# Patient Record
Sex: Female | Born: 1995
Health system: Southern US, Community
[De-identification: ages and names within clinical notes are randomized; demographics above are authoritative.]

## PROBLEM LIST (undated history)

## (undated) ENCOUNTER — Inpatient Hospital Stay (HOSPITAL_COMMUNITY): Payer: Self-pay

## (undated) ENCOUNTER — Ambulatory Visit: Admission: EM | Discharge status: Absent without leave | Payer: Medicaid Other

## (undated) DIAGNOSIS — IMO0002 Reserved for concepts with insufficient information to code with codable children: Secondary | ICD-10-CM

## (undated) DIAGNOSIS — B9689 Other specified bacterial agents as the cause of diseases classified elsewhere: Secondary | ICD-10-CM

## (undated) DIAGNOSIS — J4520 Mild intermittent asthma, uncomplicated: Secondary | ICD-10-CM

## (undated) DIAGNOSIS — L309 Dermatitis, unspecified: Secondary | ICD-10-CM

## (undated) DIAGNOSIS — J40 Bronchitis, not specified as acute or chronic: Secondary | ICD-10-CM

## (undated) DIAGNOSIS — J45909 Unspecified asthma, uncomplicated: Secondary | ICD-10-CM

## (undated) DIAGNOSIS — J309 Allergic rhinitis, unspecified: Secondary | ICD-10-CM

## (undated) DIAGNOSIS — N390 Urinary tract infection, site not specified: Secondary | ICD-10-CM

## (undated) DIAGNOSIS — O26899 Other specified pregnancy related conditions, unspecified trimester: Secondary | ICD-10-CM

## (undated) DIAGNOSIS — N760 Acute vaginitis: Secondary | ICD-10-CM

## (undated) DIAGNOSIS — R109 Unspecified abdominal pain: Secondary | ICD-10-CM

## (undated) HISTORY — DX: Mild intermittent asthma, uncomplicated: J45.20

## (undated) HISTORY — DX: Reserved for concepts with insufficient information to code with codable children: IMO0002

## (undated) HISTORY — DX: Other specified pregnancy related conditions, unspecified trimester: O26.899

## (undated) HISTORY — PX: NO PAST SURGERIES: SHX2092

## (undated) HISTORY — DX: Unspecified abdominal pain: R10.9

## (undated) HISTORY — DX: Dermatitis, unspecified: L30.9

## (undated) HISTORY — DX: Allergic rhinitis, unspecified: J30.9

---
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
## 2013-01-26 ENCOUNTER — Encounter (HOSPITAL_COMMUNITY): Payer: Self-pay | Admitting: Emergency Medicine

## 2013-01-26 ENCOUNTER — Emergency Department (HOSPITAL_COMMUNITY)
Admission: EM | Admit: 2013-01-26 | Discharge: 2013-01-26 | Disposition: A | Discharge status: Home / Self Care | Payer: Medicaid Other | Attending: Emergency Medicine | Admitting: Emergency Medicine

## 2013-01-26 DIAGNOSIS — J069 Acute upper respiratory infection, unspecified: Secondary | ICD-10-CM

## 2013-01-26 DIAGNOSIS — IMO0001 Reserved for inherently not codable concepts without codable children: Secondary | ICD-10-CM | POA: Insufficient documentation

## 2013-01-26 DIAGNOSIS — J45909 Unspecified asthma, uncomplicated: Secondary | ICD-10-CM | POA: Insufficient documentation

## 2013-01-26 MED ORDER — HYDROCOD POLST-CHLORPHEN POLST 10-8 MG/5ML PO LQCR: 5.0000 mL | Freq: Two times a day (BID) | ORAL | Status: DC | PRN

## 2013-01-26 NOTE — ED Notes (Signed)
Pt reports congestion, chills, body aches x 1 week

## 2013-01-26 NOTE — ED Provider Notes (Signed)
CSN: [id number]     Arrival date & time 01/26/13  1419 History  This chart was scribed for non-physician practitioner working with Candyce Churn, MD by Leone Payor, ED Scribe. This patient was seen in room TR08C/TR08C and the patient's care was started at 1419.     Chief Complaint  Patient presents with  . URI    The history is provided by the patient. No language interpreter was used.    HPI Comments: Sharon Waller is a 17 y.o. female with past medical history of asthma who presents to the Emergency Department complaining of 7 days of gradual onset, gradually worsening, constant sore throat with associated subjective fevers, cough, and myalgias. Pt states she has taken generic Nyquil without relief. LNMP was last month.    Past Medical History  Diagnosis Date  . Asthma    History reviewed. No pertinent past surgical history. History reviewed. No pertinent family history. History  Substance Use Topics  . Smoking status: Never Smoker   . Smokeless tobacco: Not on file  . Alcohol Use: No   OB History   Grav Para Term Preterm Abortions TAB SAB Ect Mult Living                 Review of Systems  Constitutional: Positive for fever.  HENT: Positive for sore throat.   Respiratory: Positive for cough.   Musculoskeletal: Positive for myalgias.  All other systems reviewed and are negative.    Allergies  Augmentin  Home Medications  No current outpatient prescriptions on file. BP 114/62  Pulse 74  Resp 18  SpO2 100% Physical Exam  Nursing note and vitals reviewed. Constitutional: She is oriented to person, place, and time. She appears well-developed and well-nourished.  HENT:  Head: Normocephalic and atraumatic.  Right Ear: Tympanic membrane, external ear and ear canal normal.  Left Ear: Tympanic membrane and ear canal normal.  Mouth/Throat: Uvula is midline, oropharynx is clear and moist and mucous membranes are normal. No oropharyngeal exudate, posterior  oropharyngeal edema or posterior oropharyngeal erythema.  Cardiovascular: Normal rate, regular rhythm and normal heart sounds.   Pulmonary/Chest: Effort normal and breath sounds normal. She has no wheezes. She has no rales.  Abdominal: She exhibits no distension.  Neurological: She is alert and oriented to person, place, and time.  Skin: Skin is warm and dry.  Psychiatric: She has a normal mood and affect.    ED Course  Procedures   DIAGNOSTIC STUDIES: Oxygen Saturation is 100% on RA, normal by my interpretation.    COORDINATION OF CARE: 3:28 PM Discussed treatment plan with pt at bedside and pt agreed to plan.    Labs Review Labs Reviewed - No data to display Imaging Review No results found.  EKG Interpretation   None       MDM   1. URI (upper respiratory infection)    Will treat symptomatically:doubt pneumonia or XBM:WUXLK on exam  I personally performed the services described in this documentation, which was scribed in my presence. The recorded information has been reviewed and is accurate.   Teressa Lower, NP 01/26/13 1550

## 2013-01-27 NOTE — ED Provider Notes (Signed)
Medical screening examination/treatment/procedure(s) were performed by non-physician practitioner and as supervising physician I was immediately available for consultation/collaboration.  EKG Interpretation   None         Candyce Churn, MD 01/27/13 1114

## 2013-02-21 ENCOUNTER — Encounter (HOSPITAL_COMMUNITY): Payer: Self-pay | Admitting: Emergency Medicine

## 2013-02-21 ENCOUNTER — Emergency Department (HOSPITAL_COMMUNITY)
Admission: EM | Admit: 2013-02-21 | Discharge: 2013-02-21 | Disposition: A | Discharge status: Home / Self Care | Payer: Medicaid Other | Attending: Emergency Medicine | Admitting: Emergency Medicine

## 2013-02-21 DIAGNOSIS — Z880 Allergy status to penicillin: Secondary | ICD-10-CM | POA: Insufficient documentation

## 2013-02-21 DIAGNOSIS — Z3202 Encounter for pregnancy test, result negative: Secondary | ICD-10-CM | POA: Insufficient documentation

## 2013-02-21 DIAGNOSIS — B9689 Other specified bacterial agents as the cause of diseases classified elsewhere: Secondary | ICD-10-CM | POA: Insufficient documentation

## 2013-02-21 DIAGNOSIS — N760 Acute vaginitis: Secondary | ICD-10-CM | POA: Insufficient documentation

## 2013-02-21 DIAGNOSIS — J45909 Unspecified asthma, uncomplicated: Secondary | ICD-10-CM | POA: Insufficient documentation

## 2013-02-21 DIAGNOSIS — N390 Urinary tract infection, site not specified: Secondary | ICD-10-CM | POA: Insufficient documentation

## 2013-02-21 DIAGNOSIS — A499 Bacterial infection, unspecified: Secondary | ICD-10-CM | POA: Insufficient documentation

## 2013-02-21 HISTORY — DX: Urinary tract infection, site not specified: N39.0

## 2013-02-21 LAB — WET PREP, GENITAL
Trich, Wet Prep: NONE SEEN
Yeast Wet Prep HPF POC: NONE SEEN

## 2013-02-21 LAB — URINALYSIS, ROUTINE W REFLEX MICROSCOPIC
Bilirubin Urine: NEGATIVE
Glucose, UA: NEGATIVE mg/dL
Ketones, ur: NEGATIVE mg/dL
Specific Gravity, Urine: 1.028 (ref 1.005–1.030)
pH: 7 (ref 5.0–8.0)

## 2013-02-21 LAB — URINE MICROSCOPIC-ADD ON

## 2013-02-21 MED ORDER — SULFAMETHOXAZOLE-TRIMETHOPRIM 800-160 MG PO TABS: 1.0000 | ORAL_TABLET | Freq: Two times a day (BID) | ORAL | Status: DC

## 2013-02-21 MED ORDER — PHENAZOPYRIDINE HCL 200 MG PO TABS: 200.0000 mg | ORAL_TABLET | Freq: Three times a day (TID) | ORAL | Status: DC

## 2013-02-21 MED ORDER — METRONIDAZOLE 500 MG PO TABS: 500.0000 mg | ORAL_TABLET | Freq: Two times a day (BID) | ORAL | Status: DC

## 2013-02-21 NOTE — ED Notes (Signed)
Pt reports frequent urination starting two days and lower back pain about a week ago. Pt denies pain with urination.

## 2013-02-21 NOTE — ED Provider Notes (Signed)
Medical screening examination/treatment/procedure(s) were performed by non-physician practitioner and as supervising physician I was immediately available for consultation/collaboration.  EKG Interpretation   None         Enid Skeens, MD 02/21/13 [phone]

## 2013-02-21 NOTE — ED Provider Notes (Signed)
CSN: [id number]     Arrival date & time 02/21/13  1000 History   First MD Initiated Contact with Patient 02/21/13 1003     Chief Complaint  Patient presents with  . Urinary Frequency   (Consider location/radiation/quality/duration/timing/severity/associated sxs/prior Treatment) The history is provided by the patient.   Patient reports 2 weeks mild low back pain, 2 days of urinary frequency and urgency and pressure over her bladder, also 1 week of abnormal vaginal discharge.  Has had increased stooling but no diarrhea or bloody stools.  Denies abnormal vaginal bleeding.  LMP 02/11/13.  Past Medical History  Diagnosis Date  . Asthma   . UTI (lower urinary tract infection)    History reviewed. No pertinent past surgical history. History reviewed. No pertinent family history. History  Substance Use Topics  . Smoking status: Never Smoker   . Smokeless tobacco: Not on file  . Alcohol Use: No   OB History   Grav Para Term Preterm Abortions TAB SAB Ect Mult Living                 Review of Systems  Constitutional: Negative for fever.  Respiratory: Negative for cough and shortness of breath.   Cardiovascular: Negative for chest pain.  Gastrointestinal: Positive for abdominal pain. Negative for nausea, vomiting and diarrhea.  Genitourinary: Positive for urgency, frequency and vaginal discharge. Negative for dysuria, vaginal bleeding and menstrual problem.    Allergies  Augmentin  Home Medications   Current Outpatient Rx  Name  Route  Sig  Dispense  Refill  . chlorpheniramine-HYDROcodone (TUSSIONEX PENNKINETIC ER) 10-8 MG/5ML LQCR   Oral   Take 5 mLs by mouth every 12 (twelve) hours as needed for cough.   100 mL   0    LMP 02/11/2013 Physical Exam  Nursing note and vitals reviewed. Constitutional: She appears well-developed and well-nourished. No distress.  HENT:  Head: Normocephalic and atraumatic.  Neck: Neck supple.  Pulmonary/Chest: Effort normal.  Abdominal:  Soft. She exhibits no distension and no mass. There is tenderness in the suprapubic area. There is no rebound and no guarding.  Genitourinary: Uterus is not tender. Cervix exhibits no motion tenderness and no discharge. Right adnexum displays no mass, no tenderness and no fullness. Left adnexum displays no mass, no tenderness and no fullness. No tenderness or bleeding around the vagina. No foreign body around the vagina. No signs of injury around the vagina. Vaginal discharge found.  Thick white vaginal discharge  Neurological: She is alert.  Skin: She is not diaphoretic.    ED Course  Procedures (including critical care time) Labs Review Labs Reviewed  WET PREP, GENITAL - Abnormal; Notable for the following:    Clue Cells Wet Prep HPF POC FEW (*)    WBC, Wet Prep HPF POC FEW (*)    All other components within normal limits  URINALYSIS, ROUTINE W REFLEX MICROSCOPIC - Abnormal; Notable for the following:    APPearance CLOUDY (*)    Hgb urine dipstick SMALL (*)    Protein, ur 100 (*)    Leukocytes, UA MODERATE (*)    All other components within normal limits  URINE MICROSCOPIC-ADD ON - Abnormal; Notable for the following:    Squamous Epithelial / LPF FEW (*)    Bacteria, UA FEW (*)    All other components within normal limits  GC/CHLAMYDIA PROBE AMP  URINE CULTURE  PREGNANCY, URINE   Imaging Review No results found.  EKG Interpretation   None  MDM   1. UTI (lower urinary tract infection)   2. BV (bacterial vaginosis)     Pt with low back, suprapubic pain, urinary frequency, abnormal vaginal discharge. UA shows 11-20 WBC, moderate leukocytes, will treat for UTI.  Pt with abnormal vaginal discharge on exam and few clue cells, will treat for BV. D/C home with bactrim, pyridium, flagyl. Discussed results, findings, treatment, and follow up  with patient.  Pt given return precautions.  Pt verbalizes understanding and agrees with plan.        Trixie Dredge, PA-C 02/21/13  1450

## 2013-02-22 LAB — GC/CHLAMYDIA PROBE AMP
CT Probe RNA: NEGATIVE
GC Probe RNA: NEGATIVE

## 2013-02-23 LAB — URINE CULTURE: Colony Count: >=100000

## 2013-02-24 ENCOUNTER — Telehealth (HOSPITAL_COMMUNITY): Payer: Self-pay | Admitting: Emergency Medicine

## 2013-02-24 NOTE — ED Notes (Signed)
Post ED Visit - Positive Culture Follow-up  Culture report reviewed by antimicrobial stewardship pharmacist: []  Wes Dulaney, Pharm.D., BCPS []  Celedonio Miyamoto, Pharm.D., BCPS []  Georgina Pillion, Pharm.D., BCPS []  [place], [address].D., BCPS, AAHIVP []  Estella Husk, Pharm.D., BCPS, AAHIVP [x]  Louie Casa, [address]D., BCPS  Positive urine culture Treated with Sulfa-Trimeth, organism sensitive to the same and no further patient follow-up is required at this time.  Kylie A Holland 02/24/2013, 4:16 PM

## 2013-04-30 ENCOUNTER — Encounter (HOSPITAL_COMMUNITY): Payer: Self-pay | Admitting: Emergency Medicine

## 2013-04-30 ENCOUNTER — Emergency Department (HOSPITAL_COMMUNITY)
Admission: EM | Admit: 2013-04-30 | Discharge: 2013-05-01 | Disposition: A | Discharge status: Home / Self Care | Payer: Medicaid Other | Attending: Emergency Medicine | Admitting: Emergency Medicine

## 2013-04-30 DIAGNOSIS — B9789 Other viral agents as the cause of diseases classified elsewhere: Secondary | ICD-10-CM

## 2013-04-30 DIAGNOSIS — Z87440 Personal history of urinary (tract) infections: Secondary | ICD-10-CM | POA: Insufficient documentation

## 2013-04-30 DIAGNOSIS — N61 Mastitis without abscess: Secondary | ICD-10-CM | POA: Insufficient documentation

## 2013-04-30 DIAGNOSIS — J45909 Unspecified asthma, uncomplicated: Secondary | ICD-10-CM | POA: Insufficient documentation

## 2013-04-30 DIAGNOSIS — N611 Abscess of the breast and nipple: Secondary | ICD-10-CM

## 2013-04-30 DIAGNOSIS — J329 Chronic sinusitis, unspecified: Secondary | ICD-10-CM | POA: Insufficient documentation

## 2013-04-30 HISTORY — DX: Bronchitis, not specified as acute or chronic: J40
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
## 2013-05-01 MED ORDER — PSEUDOEPHEDRINE HCL ER 120 MG PO TB12: 120.0000 mg | ORAL_TABLET | Freq: Two times a day (BID) | ORAL | Status: DC | PRN

## 2013-05-01 MED ORDER — LIDOCAINE HCL (CARDIAC) 20 MG/ML IV SOLN
INTRAVENOUS | Status: AC
  Filled 2013-05-01: qty 5

## 2013-05-01 MED ORDER — SUCCINYLCHOLINE CHLORIDE 20 MG/ML IJ SOLN
INTRAMUSCULAR | Status: AC
  Filled 2013-05-01: qty 1

## 2013-05-01 MED ORDER — PSEUDOEPHEDRINE HCL ER 120 MG PO TB12
120.0000 mg | ORAL_TABLET | ORAL | Status: AC
  Administered 2013-05-01: 120 mg via ORAL
  Filled 2013-05-01: qty 1

## 2013-05-01 MED ORDER — ROCURONIUM BROMIDE 50 MG/5ML IV SOLN
INTRAVENOUS | Status: AC
  Filled 2013-05-01: qty 2

## 2013-05-01 MED ORDER — ETOMIDATE 2 MG/ML IV SOLN
INTRAVENOUS | Status: AC
  Filled 2013-05-01: qty 20

## 2013-05-01 MED ORDER — SULFAMETHOXAZOLE-TMP DS 800-160 MG PO TABS
1.0000 | ORAL_TABLET | Freq: Once | ORAL | Status: AC
  Administered 2013-05-01: 1 via ORAL
  Filled 2013-05-01: qty 1

## 2013-05-01 MED ORDER — SULFAMETHOXAZOLE-TMP DS 800-160 MG PO TABS: 1.0000 | ORAL_TABLET | Freq: Two times a day (BID) | ORAL | Status: DC

## 2013-05-01 MED ORDER — ACETAMINOPHEN 325 MG PO TABS
650.0000 mg | ORAL_TABLET | Freq: Once | ORAL | Status: AC
  Administered 2013-05-01: 650 mg via ORAL
  Filled 2013-05-01: qty 2

## 2013-05-01 NOTE — Discharge Instructions (Signed)
Please take medications as prescribed.  You can alternate Tylenol and Motrin every 4-6 hours for facial pain, or headache.   Abscess An abscess is an infected area that contains a collection of pus and debris.It can occur in almost any part of the body. An abscess is also known as a furuncle or boil. CAUSES  An abscess occurs when tissue gets infected. This can occur from blockage of oil or sweat glands, infection of hair follicles, or a minor injury to the skin. As the body tries to fight the infection, pus collects in the area and creates pressure under the skin. This pressure causes pain. People with weakened immune systems have difficulty fighting infections and get certain abscesses more often.  SYMPTOMS Usually an abscess develops on the skin and becomes a painful mass that is red, warm, and tender. If the abscess forms under the skin, you may feel a moveable soft area under the skin. Some abscesses break open (rupture) on their own, but most will continue to get worse without care. The infection can spread deeper into the body and eventually into the bloodstream, causing you to feel ill.  DIAGNOSIS  Your caregiver will take your medical history and perform a physical exam. A sample of fluid may also be taken from the abscess to determine what is causing your infection. TREATMENT  Your caregiver may prescribe antibiotic medicines to fight the infection. However, taking antibiotics alone usually does not cure an abscess. Your caregiver may need to make a small cut (incision) in the abscess to drain the pus. In some cases, gauze is packed into the abscess to reduce pain and to continue draining the area. HOME CARE INSTRUCTIONS   Only take over-the-counter or prescription medicines for pain, discomfort, or fever as directed by your caregiver.  If you were prescribed antibiotics, take them as directed. Finish them even if you start to feel better.  If gauze is used, follow your caregiver's  directions for changing the gauze.  To avoid spreading the infection:  Keep your draining abscess covered with a bandage.  Wash your hands well.  Do not share personal care items, towels, or whirlpools with others.  Avoid skin contact with others.  Keep your skin and clothes clean around the abscess.  Keep all follow-up appointments as directed by your caregiver. SEEK MEDICAL CARE IF:   You have increased pain, swelling, redness, fluid drainage, or bleeding.  You have muscle aches, chills, or a general ill feeling.  You have a fever. MAKE SURE YOU:   Understand these instructions.  Will watch your condition.  Will get help right away if you are not doing well or get worse. Document Released: 12/08/2004 Document Revised: 08/30/2011 Document Reviewed: 05/13/2011 [facility] Patient Information 2014 [place], [place].  Sinusitis Sinusitis is redness, soreness, and swelling (inflammation) of the paranasal sinuses. Paranasal sinuses are air pockets within the bones of your face (beneath the eyes, the middle of the forehead, or above the eyes). In healthy paranasal sinuses, mucus is able to drain out, and air is able to circulate through them by way of your nose. However, when your paranasal sinuses are inflamed, mucus and air can become trapped. This can allow bacteria and other germs to grow and cause infection. Sinusitis can develop quickly and last only a short time (acute) or continue over a long period (chronic). Sinusitis that lasts for more than 12 weeks is considered chronic.  CAUSES  Causes of sinusitis include:  Allergies.  Structural abnormalities, such as  displacement of the cartilage that separates your nostrils (deviated septum), which can decrease the air flow through your nose and sinuses and affect sinus drainage.  Functional abnormalities, such as when the small hairs (cilia) that line your sinuses and help remove mucus do not work properly or are not  present. SYMPTOMS  Symptoms of acute and chronic sinusitis are the same. The primary symptoms are pain and pressure around the affected sinuses. Other symptoms include:  Upper toothache.  Earache.  Headache.  Bad breath.  Decreased sense of smell and taste.  A cough, which worsens when you are lying flat.  Fatigue.  Fever.  Thick drainage from your nose, which often is green and may contain pus (purulent).  Swelling and warmth over the affected sinuses. DIAGNOSIS  Your caregiver will perform a physical exam. During the exam, your caregiver may:  Look in your nose for signs of abnormal growths in your nostrils (nasal polyps).  Tap over the affected sinus to check for signs of infection.  View the inside of your sinuses (endoscopy) with a special imaging device with a light attached (endoscope), which is inserted into your sinuses. If your caregiver suspects that you have chronic sinusitis, one or more of the following tests may be recommended:  Allergy tests.  Nasal culture A sample of mucus is taken from your nose and sent to a lab and screened for bacteria.  Nasal cytology A sample of mucus is taken from your nose and examined by your caregiver to determine if your sinusitis is related to an allergy. TREATMENT  Most cases of acute sinusitis are related to a viral infection and will resolve on their own within 10 days. Sometimes medicines are prescribed to help relieve symptoms (pain medicine, decongestants, nasal steroid sprays, or saline sprays).  However, for sinusitis related to a bacterial infection, your caregiver will prescribe antibiotic medicines. These are medicines that will help kill the bacteria causing the infection.  Rarely, sinusitis is caused by a fungal infection. In theses cases, your caregiver will prescribe antifungal medicine. For some cases of chronic sinusitis, surgery is needed. Generally, these are cases in which sinusitis recurs more than 3 times  per year, despite other treatments. HOME CARE INSTRUCTIONS   Drink plenty of water. Water helps thin the mucus so your sinuses can drain more easily.  Use a humidifier.  Inhale steam 3 to 4 times a day (for example, sit in the bathroom with the shower running).  Apply a warm, moist washcloth to your face 3 to 4 times a day, or as directed by your caregiver.  Use saline nasal sprays to help moisten and clean your sinuses.  Take over-the-counter or prescription medicines for pain, discomfort, or fever only as directed by your caregiver. SEEK IMMEDIATE MEDICAL CARE IF:  You have increasing pain or severe headaches.  You have nausea, vomiting, or drowsiness.  You have swelling around your face.  You have vision problems.  You have a stiff neck.  You have difficulty breathing. MAKE SURE YOU:   Understand these instructions.  Will watch your condition.  Will get help right away if you are not doing well or get worse. Document Released: 02/28/2005 Document Revised: 05/23/2011 Document Reviewed: 03/15/2011 [facility] Patient Information 2014 [place], [place].  Sinus Headache A sinus headache happens when your sinuses become clogged or puffy (swollen). Sinus headaches can be mild or severe. HOME CARE  Take your medicines (antibiotics) as told. Finish them even if you start to feel better.  Only  take medicine as told by your doctor.  Use a nose spray if you feel stuffed up (congested). GET HELP RIGHT AWAY IF:  You have a fever.  You have trouble seeing.  You suddenly have pain in your face or head.  You start to twitch or shake (seizure).  You are confused.  You get headaches more than once a week.  Light or sound bothers you.  You feel sick to your stomach (nauseous) or throw up (vomit).  Your headaches do not get better with treatment. MAKE SURE YOU:  Understand these instructions.  Will watch your condition.  Will get help right away if you are not doing  well or get worse. Document Released: 06/30/2010 Document Revised: 05/23/2011 Document Reviewed: 06/30/2010 [facility] Patient Information 2014 [place], [place].

## 2013-05-01 NOTE — ED Provider Notes (Signed)
CSN: [id number]     Arrival date & time 04/30/13  2244 History   First MD Initiated Contact with Patient 05/01/13 0012     Chief Complaint  Patient presents with  . multiple complaints      (Consider location/radiation/quality/duration/timing/severity/associated sxs/prior Treatment) HPI 18 year old female presents to emergency room with complaint of one week of nasal congestion, runny nose, facial pressure.  She denies any fevers or chills.  She has not taken any medications for symptoms.  She also reports an abscess to her right breast, which ruptured today, and leakage of possible.  Patient reports prior abscess in this area about 4 years ago.  Patient had a nipple piercing, which she removed today, with rupture of the abscess. Past Medical History  Diagnosis Date  . Asthma   . UTI (lower urinary tract infection)   . Bronchitis    History reviewed. No pertinent past surgical history. History reviewed. No pertinent family history. History  Substance Use Topics  . Smoking status: Never Smoker   . Smokeless tobacco: Not on file  . Alcohol Use: No   OB History   Grav Para Term Preterm Abortions TAB SAB Ect Mult Living                 Review of Systems   See History of Present Illness; otherwise all other systems are reviewed and negative  Allergies  Augmentin  Home Medications  No current outpatient prescriptions on file. BP 122/67  Pulse 87  Temp(Src) 99.5 F (37.5 C) (Oral)  Resp 19  Wt 116 lb 14.4 oz (53.025 kg)  SpO2 100% Physical Exam  Nursing note and vitals reviewed. Constitutional: She is oriented to person, place, and time. She appears well-developed and well-nourished. No distress.  HENT:  Head: Normocephalic and atraumatic.  Nose: Nose normal.  Mouth/Throat: Oropharynx is clear and moist.  Bilateral TMs blocked by thick cerumen.  Patient with tenderness over her forehead  Eyes: Conjunctivae and EOM are normal. Pupils are equal, round, and reactive to  light.  Neck: Normal range of motion. Neck supple. No JVD present. No tracheal deviation present. No thyromegaly present.  Cardiovascular: Normal rate, regular rhythm, normal heart sounds and intact distal pulses.  Exam reveals no gallop and no friction rub.   No murmur heard. Pulmonary/Chest: Effort normal and breath sounds normal. No stridor. No respiratory distress. She has no wheezes. She has no rales. She exhibits no tenderness.  Abdominal: Soft. Bowel sounds are normal. She exhibits no distension and no mass. There is no tenderness. There is no rebound and no guarding.  Musculoskeletal: Normal range of motion. She exhibits no edema and no tenderness.  Lymphadenopathy:    She has no cervical adenopathy.  Neurological: She is alert and oriented to person, place, and time. She exhibits normal muscle tone. Coordination normal.  Skin: Skin is warm and dry. No rash noted. No erythema. No pallor.  Patient with ruptured abscess to breast at areola at 3:00.  There is some firmness but no erythema or warmth.  No further purulence able to be expressed  Psychiatric: She has a normal mood and affect. Her behavior is normal. Judgment and thought content normal.    ED Course  Procedures (including critical care time) Labs Review Labs Reviewed - No data to display Imaging Review No results found.  EKG Interpretation   None       MDM   Final diagnoses:  Viral sinusitis  Abscess of breast, right    18 year old  female with sinusitis/URI.  Will start on Sudafed and Tylenol for pain.  Will also give prescription for Bactrim.  Given location of abscess and prior history of abscess.  Will refer to the breast Center/surgery clinic    Olivia Mackie, MD 05/01/13 [phone]

## 2013-05-20 ENCOUNTER — Emergency Department (HOSPITAL_COMMUNITY)
Admission: EM | Admit: 2013-05-20 | Discharge: 2013-05-21 | Disposition: A | Discharge status: Home / Self Care | Payer: Medicaid Other | Attending: Emergency Medicine | Admitting: Emergency Medicine

## 2013-05-20 ENCOUNTER — Encounter (HOSPITAL_COMMUNITY): Payer: Self-pay | Admitting: Emergency Medicine

## 2013-05-20 DIAGNOSIS — R109 Unspecified abdominal pain: Secondary | ICD-10-CM

## 2013-05-20 DIAGNOSIS — J45909 Unspecified asthma, uncomplicated: Secondary | ICD-10-CM | POA: Insufficient documentation

## 2013-05-20 DIAGNOSIS — R112 Nausea with vomiting, unspecified: Secondary | ICD-10-CM | POA: Insufficient documentation

## 2013-05-20 DIAGNOSIS — Z3202 Encounter for pregnancy test, result negative: Secondary | ICD-10-CM | POA: Insufficient documentation

## 2013-05-20 DIAGNOSIS — Z87440 Personal history of urinary (tract) infections: Secondary | ICD-10-CM | POA: Insufficient documentation

## 2013-05-20 DIAGNOSIS — R197 Diarrhea, unspecified: Secondary | ICD-10-CM | POA: Insufficient documentation

## 2013-05-20 DIAGNOSIS — R1084 Generalized abdominal pain: Secondary | ICD-10-CM | POA: Insufficient documentation

## 2013-05-20 DIAGNOSIS — R1110 Vomiting, unspecified: Secondary | ICD-10-CM

## 2013-05-20 LAB — COMPREHENSIVE METABOLIC PANEL
ALK PHOS: 57 U/L (ref 39–117)
ALT: 9 U/L (ref 0–35)
AST: 15 U/L (ref 0–37)
Albumin: 4 g/dL (ref 3.5–5.2)
BILIRUBIN TOTAL: 0.6 mg/dL (ref 0.3–1.2)
BUN: 12 mg/dL (ref 6–23)
CALCIUM: 9.1 mg/dL (ref 8.4–10.5)
CHLORIDE: 100 meq/L (ref 96–112)
CO2: 26 meq/L (ref 19–32)
Creatinine, Ser: 0.48 mg/dL — ABNORMAL LOW (ref 0.50–1.10)
GLUCOSE: 93 mg/dL (ref 70–99)
Potassium: 3.5 mEq/L — ABNORMAL LOW (ref 3.7–5.3)
SODIUM: 139 meq/L (ref 137–147)
Total Protein: 7.3 g/dL (ref 6.0–8.3)

## 2013-05-20 LAB — CBC WITH DIFFERENTIAL/PLATELET
Basophils Absolute: 0 10*3/uL (ref 0.0–0.1)
Basophils Relative: 0 % (ref 0–1)
EOS PCT: 0 % (ref 0–5)
Eosinophils Absolute: 0 10*3/uL (ref 0.0–0.7)
HEMATOCRIT: 36.3 % (ref 36.0–46.0)
Hemoglobin: 12.1 g/dL (ref 12.0–15.0)
LYMPHS ABS: 0.3 10*3/uL — AB (ref 0.7–4.0)
LYMPHS PCT: 4 % — AB (ref 12–46)
MCH: 30.6 pg (ref 26.0–34.0)
MCHC: 33.3 g/dL (ref 30.0–36.0)
MCV: 91.7 fL (ref 78.0–100.0)
Monocytes Absolute: 0.3 10*3/uL (ref 0.1–1.0)
Monocytes Relative: 3 % (ref 3–12)
NEUTROS ABS: 7 10*3/uL (ref 1.7–7.7)
Neutrophils Relative %: 92 % — ABNORMAL HIGH (ref 43–77)
PLATELETS: 219 10*3/uL (ref 150–400)
RBC: 3.96 MIL/uL (ref 3.87–5.11)
RDW: 13.4 % (ref 11.5–15.5)
WBC: 7.6 10*3/uL (ref 4.0–10.5)

## 2013-05-20 MED ORDER — SODIUM CHLORIDE 0.9 % IV BOLUS (SEPSIS)
1000.0000 mL | Freq: Once | INTRAVENOUS | Status: AC
  Administered 2013-05-20: 1000 mL via INTRAVENOUS

## 2013-05-20 NOTE — ED Notes (Signed)
Patient states she ate at [employer] last night and ate chicken wings and pasta with chicken.  Woke up this morning with N/V/D

## 2013-05-20 NOTE — ED Notes (Signed)
Nausea, vomiting and diarrhea since waking this morning. Feels she has food poisoning from eating pasta at [employer] last evening.

## 2013-05-21 LAB — URINALYSIS, ROUTINE W REFLEX MICROSCOPIC
BILIRUBIN URINE: NEGATIVE
GLUCOSE, UA: NEGATIVE mg/dL
HGB URINE DIPSTICK: NEGATIVE
KETONES UR: 15 mg/dL — AB
Leukocytes, UA: NEGATIVE
Nitrite: NEGATIVE
PROTEIN: NEGATIVE mg/dL
Specific Gravity, Urine: 1.035 — ABNORMAL HIGH (ref 1.005–1.030)
UROBILINOGEN UA: 1 mg/dL (ref 0.0–1.0)
pH: 7 (ref 5.0–8.0)

## 2013-05-21 LAB — PREGNANCY, URINE: Preg Test, Ur: NEGATIVE

## 2013-05-21 MED ORDER — SODIUM CHLORIDE 0.9 % IV BOLUS (SEPSIS)
1000.0000 mL | Freq: Once | INTRAVENOUS | Status: AC
  Administered 2013-05-21: 1000 mL via INTRAVENOUS

## 2013-05-21 MED ORDER — ONDANSETRON 4 MG PO TBDP: ORAL_TABLET | ORAL | Status: DC

## 2013-05-21 MED ORDER — DICYCLOMINE HCL 10 MG PO CAPS
10.0000 mg | ORAL_CAPSULE | Freq: Once | ORAL | Status: AC
  Administered 2013-05-21: 10 mg via ORAL
  Filled 2013-05-21: qty 1

## 2013-05-21 MED ORDER — DICYCLOMINE HCL 20 MG PO TABS: 20.0000 mg | ORAL_TABLET | Freq: Two times a day (BID) | ORAL | Status: DC | PRN

## 2013-05-21 NOTE — Discharge Instructions (Signed)
Abdominal Pain, Adult °Many things can cause abdominal pain. Usually, abdominal pain is not caused by a disease and will improve without treatment. It can often be observed and treated at home. Your health care provider will do a physical exam and possibly order blood tests and X-rays to help determine the seriousness of your pain. However, in many cases, more time must pass before a clear cause of the pain can be found. Before that point, your health care provider may not know if you need more testing or further treatment. °HOME CARE INSTRUCTIONS  °Monitor your abdominal pain for any changes. The following actions may help to alleviate any discomfort you are experiencing: °· Only take over-the-counter or prescription medicines as directed by your health care provider. °· Do not take laxatives unless directed to do so by your health care provider. °· Try a clear liquid diet (broth, tea, or water) as directed by your health care provider. Slowly move to a bland diet as tolerated. °SEEK MEDICAL CARE IF: °· You have unexplained abdominal pain. °· You have abdominal pain associated with nausea or diarrhea. °· You have pain when you urinate or have a bowel movement. °· You experience abdominal pain that wakes you in the night. °· You have abdominal pain that is worsened or improved by eating food. °· You have abdominal pain that is worsened with eating fatty foods. °SEEK IMMEDIATE MEDICAL CARE IF:  °· Your pain does not go away within 2 hours. °· You have a fever. °· You keep throwing up (vomiting). °· Your pain is felt only in portions of the abdomen, such as the right side or the left lower portion of the abdomen. °· You pass bloody or black tarry stools. °MAKE SURE YOU: °· Understand these instructions.   °· Will watch your condition.   °· Will get help right away if you are not doing well or get worse.   °Document Released: 12/08/2004 Document Revised: 12/19/2012 Document Reviewed: 11/07/2012 °Exit[employer]® Patient  Information ©2014 [employer]. ° °Nausea and Vomiting °Nausea is a sick feeling that often comes before throwing up (vomiting). Vomiting is a reflex where stomach contents come out of your mouth. Vomiting can cause severe loss of body fluids (dehydration). Children and elderly adults can become dehydrated quickly, especially if they also have diarrhea. Nausea and vomiting are symptoms of a condition or disease. It is important to find the cause of your symptoms. °CAUSES  °· Direct irritation of the stomach lining. This irritation can result from increased acid production (gastroesophageal reflux disease), infection, food poisoning, taking certain medicines (such as nonsteroidal anti-inflammatory drugs), alcohol use, or tobacco use. °· Signals from the brain. These signals could be caused by a headache, heat exposure, an inner ear disturbance, increased pressure in the brain from injury, infection, a tumor, or a concussion, pain, emotional stimulus, or metabolic problems. °· An obstruction in the gastrointestinal tract (bowel obstruction). °· Illnesses such as diabetes, hepatitis, gallbladder problems, appendicitis, kidney problems, cancer, sepsis, atypical symptoms of a heart attack, or eating disorders. °· Medical treatments such as chemotherapy and radiation. °· Receiving medicine that makes you sleep (general anesthetic) during surgery. °DIAGNOSIS °Your caregiver may ask for tests to be done if the problems do not improve after a few days. Tests may also be done if symptoms are severe or if the reason for the nausea and vomiting is not clear. Tests may include: °· Urine tests. °· Blood tests. °· Stool tests. °· Cultures (to look for evidence of infection). °· X-rays or   other imaging studies. °Test results can help your caregiver make decisions about treatment or the need for additional tests. °TREATMENT °You need to stay well hydrated. Drink frequently but in small amounts. You may wish to drink water, sports  drinks, clear broth, or eat frozen ice pops or gelatin dessert to help stay hydrated. When you eat, eating slowly may help prevent nausea. There are also some antinausea medicines that may help prevent nausea. °HOME CARE INSTRUCTIONS  °· Take all medicine as directed by your caregiver. °· If you do not have an appetite, do not force yourself to eat. However, you must continue to drink fluids. °· If you have an appetite, eat a normal diet unless your caregiver tells you differently. °· Eat a variety of complex carbohydrates (rice, wheat, potatoes, bread), lean meats, yogurt, fruits, and vegetables. °· Avoid high-fat foods because they are more difficult to digest. °· Drink enough water and fluids to keep your urine clear or pale yellow. °· If you are dehydrated, ask your caregiver for specific rehydration instructions. Signs of dehydration may include: °· Severe thirst. °· Dry lips and mouth. °· Dizziness. °· Dark urine. °· Decreasing urine frequency and amount. °· Confusion. °· Rapid breathing or pulse. °SEEK IMMEDIATE MEDICAL CARE IF:  °· You have blood or brown flecks (like coffee grounds) in your vomit. °· You have black or bloody stools. °· You have a severe headache or stiff neck. °· You are confused. °· You have severe abdominal pain. °· You have chest pain or trouble breathing. °· You do not urinate at least once every 8 hours. °· You develop cold or clammy skin. °· You continue to vomit for longer than 24 to 48 hours. °· You have a fever. °MAKE SURE YOU:  °· Understand these instructions. °· Will watch your condition. °· Will get help right away if you are not doing well or get worse. °Document Released: 02/28/2005 Document Revised: 05/23/2011 Document Reviewed: 07/28/2010 °ExitCare® Patient Information ©2014 [employer]. ° °

## 2013-05-21 NOTE — ED Provider Notes (Signed)
CSN: [id number]     Arrival date & time 05/20/13  2002 History   First MD Initiated Contact with Patient 05/20/13 2309     Chief Complaint  Patient presents with  . Abdominal Pain     (Consider location/radiation/quality/duration/timing/severity/associated sxs/prior Treatment) HPI Patient states she ate at [employer] last evening (greater than 24 hours ago). She woke this morning with diffuse stomach cramping and nausea. She's vomited 3 times and had loose stool 3 times as well. She denies any fevers or chills. She last vomited 5 hours ago. Denies any urinary symptoms. No sick contacts. Past Medical History  Diagnosis Date  . Asthma   . UTI (lower urinary tract infection)   . Bronchitis    History reviewed. No pertinent past surgical history. History reviewed. No pertinent family history. History  Substance Use Topics  . Smoking status: Never Smoker   . Smokeless tobacco: Never Used  . Alcohol Use: No   OB History   Grav Para Term Preterm Abortions TAB SAB Ect Mult Living                 Review of Systems  Constitutional: Negative for fever and chills.  Respiratory: Negative for shortness of breath.   Cardiovascular: Negative for chest pain.  Gastrointestinal: Positive for nausea, vomiting, abdominal pain and diarrhea. Negative for constipation and blood in stool.  Genitourinary: Negative for dysuria, vaginal bleeding and vaginal discharge.  Musculoskeletal: Negative for back pain, myalgias, neck pain and neck stiffness.  Skin: Negative for rash and wound.  Neurological: Negative for dizziness, weakness, light-headedness, numbness and headaches.  All other systems reviewed and are negative.      Allergies  Augmentin  Home Medications   Current Outpatient Rx  Name  Route  Sig  Dispense  Refill  . dicyclomine (BENTYL) 20 MG tablet   Oral   Take 1 tablet (20 mg total) by mouth 2 (two) times daily as needed for spasms.   20 tablet   0   . ondansetron (ZOFRAN ODT)  4 MG disintegrating tablet      4mg  ODT q4 hours prn nausea/vomit   8 tablet   0    BP 104/61  Pulse 85  Temp(Src) 99.9 F (37.7 C) (Oral)  Resp 16  Ht 5\' 6"  (1.676 m)  Wt 115 lb (52.164 kg)  BMI 18.57 kg/m2  SpO2 100%  LMP 04/26/2013 Physical Exam  Nursing note and vitals reviewed. Constitutional: She is oriented to person, place, and time. She appears well-developed and well-nourished. No distress.  HENT:  Head: Normocephalic and atraumatic.  Mouth/Throat: Oropharynx is clear and moist.  Eyes: EOM are normal. Pupils are equal, round, and reactive to light.  Neck: Normal range of motion. Neck supple.  Cardiovascular: Normal rate and regular rhythm.   Pulmonary/Chest: Effort normal and breath sounds normal. No respiratory distress. She has no wheezes. She has no rales. She exhibits no tenderness.  Abdominal: Soft. Bowel sounds are normal. She exhibits no distension and no mass. There is no tenderness. There is no rebound and no guarding.  No tenderness whatsoever on palpation of the patient's abdomen  Musculoskeletal: Normal range of motion. She exhibits no edema and no tenderness.  No CVA tenderness bilaterally  Neurological: She is alert and oriented to person, place, and time.  Patient is alert and oriented x3 with clear, goal oriented speech. Patient has 5/5 motor in all extremities. Sensation is intact to light touch.  Skin: Skin is warm and dry. No rash  noted. No erythema.  Psychiatric: She has a normal mood and affect. Her behavior is normal.    ED Course  Procedures (including critical care time) Labs Review Labs Reviewed  CBC WITH DIFFERENTIAL - Abnormal; Notable for the following:    Neutrophils Relative % 92 (*)    Lymphocytes Relative 4 (*)    Lymphs Abs 0.3 (*)    All other components within normal limits  COMPREHENSIVE METABOLIC PANEL - Abnormal; Notable for the following:    Potassium 3.5 (*)    Creatinine, Ser 0.48 (*)    All other components within  normal limits  URINALYSIS, ROUTINE W REFLEX MICROSCOPIC - Abnormal; Notable for the following:    APPearance CLOUDY (*)    Specific Gravity, Urine 1.035 (*)    Ketones, ur 15 (*)    All other components within normal limits  PREGNANCY, URINE   Imaging Review No results found.   EKG Interpretation None      MDM   Final diagnoses:  Vomiting and diarrhea  Abdominal pain    The patient is very well-appearing. She has a soft abdomen. I do not believe imaging is necessary at this point. We'll give IV fluids and reassess. Suspect food poisoning versus gastroenteritis.  Patient had no more vomiting in the emergency department. Her abdomen is soft and nontender. She's requesting discharge home. Return precautions have been given and the patient has voiced understanding.  Loren Racer, MD 05/21/13 [phone]

## 2014-01-29 ENCOUNTER — Emergency Department (HOSPITAL_COMMUNITY)
Admission: EM | Admit: 2014-01-29 | Discharge: 2014-01-29 | Disposition: A | Discharge status: Home / Self Care | Payer: BLUE CROSS/BLUE SHIELD | Attending: Emergency Medicine | Admitting: Emergency Medicine

## 2014-01-29 ENCOUNTER — Encounter (HOSPITAL_COMMUNITY): Payer: Self-pay | Admitting: *Deleted

## 2014-01-29 ENCOUNTER — Emergency Department (HOSPITAL_COMMUNITY): Payer: BLUE CROSS/BLUE SHIELD

## 2014-01-29 DIAGNOSIS — R42 Dizziness and giddiness: Secondary | ICD-10-CM | POA: Insufficient documentation

## 2014-01-29 DIAGNOSIS — J45909 Unspecified asthma, uncomplicated: Secondary | ICD-10-CM | POA: Diagnosis not present

## 2014-01-29 DIAGNOSIS — Z87440 Personal history of urinary (tract) infections: Secondary | ICD-10-CM | POA: Insufficient documentation

## 2014-01-29 DIAGNOSIS — G8929 Other chronic pain: Secondary | ICD-10-CM

## 2014-01-29 DIAGNOSIS — R110 Nausea: Secondary | ICD-10-CM | POA: Insufficient documentation

## 2014-01-29 DIAGNOSIS — R51 Headache: Secondary | ICD-10-CM | POA: Insufficient documentation

## 2014-01-29 DIAGNOSIS — R519 Headache, unspecified: Secondary | ICD-10-CM

## 2014-01-29 LAB — I-STAT CHEM 8, ED
BUN: 15 mg/dL (ref 6–23)
CALCIUM ION: 1.21 mmol/L (ref 1.12–1.23)
CHLORIDE: 102 meq/L (ref 96–112)
CREATININE: 0.7 mg/dL (ref 0.50–1.10)
GLUCOSE: 82 mg/dL (ref 70–99)
HCT: 39 % (ref 36.0–46.0)
Hemoglobin: 13.3 g/dL (ref 12.0–15.0)
POTASSIUM: 3.6 meq/L — AB (ref 3.7–5.3)
Sodium: 139 mEq/L (ref 137–147)
TCO2: 25 mmol/L (ref 0–100)

## 2014-01-29 LAB — I-STAT BETA HCG BLOOD, ED (MC, WL, AP ONLY): I-stat hCG, quantitative: <5 m[IU]/mL (ref <5)

## 2014-01-29 MED ORDER — TRAMADOL HCL 50 MG PO TABS: 50.0000 mg | ORAL_TABLET | Freq: Four times a day (QID) | ORAL | Status: DC | PRN

## 2014-01-29 NOTE — ED Notes (Signed)
Pt reports having headaches x 3 weeks, today feeling nausea and fatigue, felt near syncope earlier today. No relief with otc meds. No acute distress noted at triage.

## 2014-01-29 NOTE — Discharge Instructions (Signed)

## 2014-01-29 NOTE — ED Notes (Signed)
Patient transported to CT 

## 2014-01-29 NOTE — ED Notes (Signed)
Pt ambulating independently w/ steady gait on d/c in no acute distress, A&Ox4. D/c instructions reviewed w/ pt - pt denies any further questions or concerns at present.  

## 2014-01-29 NOTE — ED Provider Notes (Signed)
CSN: [id number]     Arrival date & time 01/29/14  1645 History   First MD Initiated Contact with Patient 01/29/14 1732     Chief Complaint  Patient presents with  . Headache      HPI Three-week history of intermittent headaches.  Today feeling some nausea and dizziness.  Felt near syncopal earlier.  Was hot before this occurred.  Denies any fever or chills.  Headache is been off and on for the last several weeks. Past Medical History  Diagnosis Date  . Asthma   . UTI (lower urinary tract infection)   . Bronchitis    History reviewed. No pertinent past surgical history. History reviewed. No pertinent family history. History  Substance Use Topics  . Smoking status: Never Smoker   . Smokeless tobacco: Never Used  . Alcohol Use: No   OB History    No data available     Review of Systems  All other systems reviewed and are negative  Allergies  Augmentin  Home Medications   Prior to Admission medications   Medication Sig Start Date End Date Taking? Authorizing Provider  acetaminophen (TYLENOL) 325 MG tablet Take 650 mg by mouth every 6 (six) hours as needed for mild pain or moderate pain.   Yes Historical Provider, MD  dicyclomine (BENTYL) 20 MG tablet Take 1 tablet (20 mg total) by mouth 2 (two) times daily as needed for spasms. Patient not taking: Reported on 01/29/2014 05/21/13   Loren Raceravid Yelverton, MD  ondansetron (ZOFRAN ODT) 4 MG disintegrating tablet 4mg  ODT q4 hours prn nausea/vomit Patient not taking: Reported on 01/29/2014 05/21/13   Loren Raceravid Yelverton, MD  traMADol (ULTRAM) 50 MG tablet Take 1 tablet (50 mg total) by mouth every 6 (six) hours as needed. 01/29/14   Nelia Shiobert L Nikki Glanzer, MD   BP 109/69 mmHg  Pulse 63  Temp(Src) 98.4 F (36.9 C)  Resp 18  Wt 117 lb 3 oz (53.156 kg)  SpO2 100%  LMP 01/22/2014 Physical Exam Physical Exam  Nursing note and vitals reviewed. Constitutional: She is oriented to person, place, and time. She appears well-developed and  well-nourished. No distress.  HENT:  Head: Normocephalic and atraumatic.  Eyes: Pupils are equal, round, and reactive to light. no papilledema.  Extraocular movements intact. Neck: Normal range of motion.supple with no meningeal signs.  Cardiovascular: Normal rate and intact distal pulses.   Pulmonary/Chest: No respiratory distress.  Abdominal: Normal appearance. She exhibits no distension.  Musculoskeletal: Normal range of motion.  Neurological: She is alert and oriented to person, place, and time. No cranial nerve deficit. normal gait.  No weakness. Skin: Skin is warm and dry. No rash noted.  Psychiatric: She has a normal mood and affect. Her behavior is normal.   ED Course  Procedures (including critical care time) Labs Review Labs Reviewed  I-STAT CHEM 8, ED - Abnormal; Notable for the following:    Potassium 3.6 (*)    All other components within normal limits  I-STAT BETA HCG BLOOD, ED ([facility], [facility], [facility] ONLY)    Imaging Review Ct Head Wo Contrast  01/29/2014   CLINICAL DATA:  Headache for 3 weeks with nausea and vomiting this morning, personal history of asthma  EXAM: CT HEAD WITHOUT CONTRAST  TECHNIQUE: Contiguous axial images were obtained from the base of the skull through the vertex without intravenous contrast.  COMPARISON:  None  FINDINGS: Normal ventricular morphology.  No midline shift or mass effect.  No intracranial hemorrhage, mass lesion or evidence acute  infarction.  Scattered beam hardening artifacts from jewelry at the ears.  No extra-axial fluid collections.  Bones and sinuses unremarkable.  IMPRESSION: Normal exam.   Electronically Signed   By: Ulyses SouthwardMark  Boles M.D.   On: 01/29/2014 18:07      MDM   Final diagnoses:  Chronic nonintractable headache, unspecified headache type        Nelia Shiobert L Ilayda Toda, MD 01/29/14 1932

## 2015-01-24 ENCOUNTER — Encounter (HOSPITAL_COMMUNITY): Payer: Self-pay | Admitting: Nurse Practitioner

## 2015-01-24 ENCOUNTER — Emergency Department (HOSPITAL_COMMUNITY)
Admission: EM | Admit: 2015-01-24 | Discharge: 2015-01-24 | Disposition: A | Discharge status: Home / Self Care | Payer: BLUE CROSS/BLUE SHIELD | Attending: Emergency Medicine | Admitting: Emergency Medicine

## 2015-01-24 DIAGNOSIS — M545 Low back pain, unspecified: Secondary | ICD-10-CM

## 2015-01-24 DIAGNOSIS — M419 Scoliosis, unspecified: Secondary | ICD-10-CM | POA: Insufficient documentation

## 2015-01-24 DIAGNOSIS — Z87440 Personal history of urinary (tract) infections: Secondary | ICD-10-CM | POA: Insufficient documentation

## 2015-01-24 DIAGNOSIS — Z3202 Encounter for pregnancy test, result negative: Secondary | ICD-10-CM | POA: Insufficient documentation

## 2015-01-24 DIAGNOSIS — R350 Frequency of micturition: Secondary | ICD-10-CM | POA: Insufficient documentation

## 2015-01-24 DIAGNOSIS — N898 Other specified noninflammatory disorders of vagina: Secondary | ICD-10-CM

## 2015-01-24 DIAGNOSIS — F17210 Nicotine dependence, cigarettes, uncomplicated: Secondary | ICD-10-CM | POA: Insufficient documentation

## 2015-01-24 DIAGNOSIS — J45909 Unspecified asthma, uncomplicated: Secondary | ICD-10-CM | POA: Insufficient documentation

## 2015-01-24 HISTORY — DX: Other specified bacterial agents as the cause of diseases classified elsewhere: B96.89

## 2015-01-24 HISTORY — DX: Other specified bacterial agents as the cause of diseases classified elsewhere: N76.0

## 2015-01-24 LAB — URINALYSIS, ROUTINE W REFLEX MICROSCOPIC
Bilirubin Urine: NEGATIVE
Glucose, UA: NEGATIVE mg/dL
HGB URINE DIPSTICK: NEGATIVE
Ketones, ur: NEGATIVE mg/dL
LEUKOCYTES UA: NEGATIVE
Nitrite: NEGATIVE
PROTEIN: NEGATIVE mg/dL
SPECIFIC GRAVITY, URINE: 1.027 (ref 1.005–1.030)
Urobilinogen, UA: 1 mg/dL (ref 0.0–1.0)
pH: 6 (ref 5.0–8.0)

## 2015-01-24 LAB — WET PREP, GENITAL
CLUE CELLS WET PREP: NONE SEEN
TRICH WET PREP: NONE SEEN
Yeast Wet Prep HPF POC: NONE SEEN

## 2015-01-24 LAB — POC URINE PREG, ED: PREG TEST UR: NEGATIVE

## 2015-01-24 MED ORDER — CYCLOBENZAPRINE HCL 10 MG PO TABS
5.0000 mg | ORAL_TABLET | Freq: Once | ORAL | Status: AC
  Administered 2015-01-24: 5 mg via ORAL
  Filled 2015-01-24: qty 1

## 2015-01-24 MED ORDER — CYCLOBENZAPRINE HCL 5 MG PO TABS: 5.0000 mg | ORAL_TABLET | Freq: Three times a day (TID) | ORAL | Status: DC | PRN

## 2015-01-24 MED ORDER — NAPROXEN 375 MG PO TABS: 375.0000 mg | ORAL_TABLET | Freq: Two times a day (BID) | ORAL | Status: DC

## 2015-01-24 MED ORDER — IBUPROFEN 400 MG PO TABS
400.0000 mg | ORAL_TABLET | Freq: Once | ORAL | Status: AC
  Administered 2015-01-24: 400 mg via ORAL
  Filled 2015-01-24: qty 1

## 2015-01-24 NOTE — ED Notes (Signed)
She c/o 1 week history of lower back pain and several week history of vaginal discharge. She was treated by health department multiple times for BV but symptoms continue to return. She reports nausea, urinary frequency. Denies vomiting, fevers, bowel changes.

## 2015-01-24 NOTE — ED Provider Notes (Signed)
CSN: [id number]     Arrival date & time 01/24/15  1736 History  By signing my name below, I, Elon SpannerGarrett Cook, attest that this documentation has been prepared under the direction and in the presence of [facility] Orlene OchM Clarke Peretz, NP. Electronically Signed: Elon SpannerGarrett Cook ED Scribe. 01/24/2015. 10:17 PM.   Chief Complaint  Patient presents with  . Back Pain   Patient is a 19 y.o. female presenting with back pain. The history is provided by the patient. No language interpreter was used.  Back Pain Location:  Lumbar spine Radiates to:  Does not radiate Pain severity:  Moderate Onset quality:  Gradual Duration:  1 week Timing:  Constant Progression:  Unchanged Chronicity:  New Relieved by:  Nothing Ineffective treatments:  None tried  HPI Comments: Sharon Waller is a 19 y.o. female who presents to the Emergency Department complaining of lower back pain onset 1 week ago; relieved transiently by ibuprofen.  Associated symptoms include urinary frequency and vaginal discharge.  Patient reports a  recurrent hx of BV which has been treated most recently with a 7 day course of metronidazole.  Patient is sexually active in a monogamous relationship for 1 year.  She reports a hx of chlamydia > 3 years ago which she has been treated for.   Patient denies hx of pregnancy. She is unsure if she has had a pap smear.  She reports her PCP told her that she "might" have scoliosis 4 years ago but she has had no issues since.  She denies dysuria, abdominal pain.   Past Medical History  Diagnosis Date  . Asthma   . UTI (lower urinary tract infection)   . Bronchitis   . Bacterial vaginosis    History reviewed. No pertinent past surgical history. History reviewed. No pertinent family history. Social History  Substance Use Topics  . Smoking status: Current Every Day Smoker    Types: Cigarettes  . Smokeless tobacco: Never Used  . Alcohol Use: No   OB History    No data available     Review of Systems   Genitourinary: Positive for frequency and vaginal discharge.  Musculoskeletal: Positive for back pain.  All other systems reviewed and are negative.     Allergies  Augmentin  Home Medications   Prior to Admission medications   Medication Sig Start Date End Date Taking? Authorizing Provider  acetaminophen (TYLENOL) 325 MG tablet Take 650 mg by mouth every 6 (six) hours as needed for mild pain or moderate pain.    Historical Provider, MD  cyclobenzaprine (FLEXERIL) 5 MG tablet Take 1 tablet (5 mg total) by mouth 3 (three) times daily as needed for muscle spasms. 01/24/15   Jayln Madeira Orlene OchM Vicky Mccanless, NP  naproxen (NAPROSYN) 375 MG tablet Take 1 tablet (375 mg total) by mouth 2 (two) times daily. 01/24/15   Najmah Carradine Orlene OchM Cohen Doleman, NP   BP 104/70 mmHg  Pulse 73  Temp(Src) 98.2 F (36.8 C) (Oral)  Resp 14  SpO2 100%  LMP 01/08/2015 Physical Exam  Constitutional: She is oriented to person, place, and time. She appears well-developed and well-nourished. No distress.  HENT:  Head: Normocephalic and atraumatic.  Right Ear: Tympanic membrane normal.  Left Ear: Tympanic membrane normal.  Nose: Nose normal.  Mouth/Throat: Uvula is midline, oropharynx is clear and moist and mucous membranes are normal.  Eyes: Conjunctivae and EOM are normal.  Neck: Normal range of motion. Neck supple.  Cardiovascular: Normal rate and regular rhythm.   Pulmonary/Chest: Effort normal and breath  sounds normal.  Abdominal: Soft. Bowel sounds are normal. There is no tenderness. There is no CVA tenderness.  Genitourinary:  External genitalia without lesions, white d/c vaginal vault, no CMT, no adnexal tenderness, uterus without palpable enlargement.   Musculoskeletal: Normal range of motion.       Lumbar back: She exhibits tenderness, pain and spasm. She exhibits normal pulse.  Straight leg raises without pain.  When bending forward scoliosis is noted.   Neurological: She is alert and oriented to person, place, and time. She has  normal strength. No cranial nerve deficit or sensory deficit. Gait normal.  Reflex Scores:      Bicep reflexes are 2+ on the right side and 2+ on the left side.      Brachioradialis reflexes are 2+ on the right side and 2+ on the left side.      Patellar reflexes are 2+ on the right side and 2+ on the left side.      Achilles reflexes are 2+ on the right side and 2+ on the left side. Skin: Skin is warm and dry.  Psychiatric: She has a normal mood and affect. Her behavior is normal.  Nursing note and vitals reviewed.   ED Course  Procedures (including critical care time)  DIAGNOSTIC STUDIES: Oxygen Saturation is 100% on RA, normal by my interpretation.    COORDINATION OF CARE:    Labs Review Results for orders placed or performed during the hospital encounter of 01/24/15 (from the past 24 hour(s))  Urinalysis, Routine w reflex microscopic (not at [facility])     Status: None   Collection Time: 01/24/15  6:55 PM  Result Value Ref Range   Color, Urine YELLOW YELLOW   APPearance CLEAR CLEAR   Specific Gravity, Urine 1.027 1.005 - 1.030   pH 6.0 5.0 - 8.0   Glucose, UA NEGATIVE NEGATIVE mg/dL   Hgb urine dipstick NEGATIVE NEGATIVE   Bilirubin Urine NEGATIVE NEGATIVE   Ketones, ur NEGATIVE NEGATIVE mg/dL   Protein, ur NEGATIVE NEGATIVE mg/dL   Urobilinogen, UA 1.0 0.0 - 1.0 mg/dL   Nitrite NEGATIVE NEGATIVE   Leukocytes, UA NEGATIVE NEGATIVE  POC urine preg, ED (not at [facility])     Status: None   Collection Time: 01/24/15  7:06 PM  Result Value Ref Range   Preg Test, Ur NEGATIVE NEGATIVE  Wet prep, genital     Status: Abnormal   Collection Time: 01/24/15  8:57 PM  Result Value Ref Range   Yeast Wet Prep HPF POC NONE SEEN NONE SEEN   Trich, Wet Prep NONE SEEN NONE SEEN   Clue Cells Wet Prep HPF POC NONE SEEN NONE SEEN   WBC, Wet Prep HPF POC FEW (A) NONE SEEN     MDM  19 y.o. female with low back pain and vaginal d/c. Stable for d/c without neuro deficits noted. Wet prep negative  for BV. Discussed with the patient and all questioned fully answered. She will follow up with ortho for further evaluation of her back. She will return here as needed if any problems arise.  Final diagnoses:  Bilateral low back pain without sciatica  Scoliosis  Vaginal discharge   I personally performed the services described in this documentation, which was scribed in my presence. The recorded information has been reviewed and is accurate.    [address] [place], [place] 01/24/15 2221  Margarita Grizzle, MD 01/25/15 [phone]

## 2015-01-24 NOTE — Discharge Instructions (Signed)
Follow up with the orthopedic doctor for further evaluation of your back. Do not drive while taking the muscle relaxant as it will make you sleepy.

## 2015-01-26 LAB — GC/CHLAMYDIA PROBE AMP (~~LOC~~) NOT AT ARMC
Chlamydia: POSITIVE — AB
Neisseria Gonorrhea: POSITIVE — AB

## 2015-01-27 ENCOUNTER — Telehealth (HOSPITAL_COMMUNITY): Payer: Self-pay

## 2015-01-27 NOTE — Telephone Encounter (Signed)
Positive for gonorrhea and chlamydia. Chart sent to EDP office for review.  

## 2015-01-30 ENCOUNTER — Telehealth (HOSPITAL_BASED_OUTPATIENT_CLINIC_OR_DEPARTMENT_OTHER): Payer: Self-pay | Admitting: *Deleted

## 2015-04-12 ENCOUNTER — Emergency Department (HOSPITAL_COMMUNITY)
Admission: EM | Admit: 2015-04-12 | Discharge: 2015-04-12 | Disposition: A | Discharge status: Home / Self Care | Payer: BLUE CROSS/BLUE SHIELD | Attending: Emergency Medicine | Admitting: Emergency Medicine

## 2015-04-12 ENCOUNTER — Encounter (HOSPITAL_COMMUNITY): Payer: Self-pay | Admitting: *Deleted

## 2015-04-12 ENCOUNTER — Emergency Department (HOSPITAL_COMMUNITY): Payer: BLUE CROSS/BLUE SHIELD

## 2015-04-12 DIAGNOSIS — Z8619 Personal history of other infectious and parasitic diseases: Secondary | ICD-10-CM | POA: Insufficient documentation

## 2015-04-12 DIAGNOSIS — J40 Bronchitis, not specified as acute or chronic: Secondary | ICD-10-CM

## 2015-04-12 DIAGNOSIS — Z87440 Personal history of urinary (tract) infections: Secondary | ICD-10-CM | POA: Insufficient documentation

## 2015-04-12 DIAGNOSIS — F17210 Nicotine dependence, cigarettes, uncomplicated: Secondary | ICD-10-CM | POA: Insufficient documentation

## 2015-04-12 DIAGNOSIS — Z791 Long term (current) use of non-steroidal anti-inflammatories (NSAID): Secondary | ICD-10-CM | POA: Insufficient documentation

## 2015-04-12 DIAGNOSIS — J45901 Unspecified asthma with (acute) exacerbation: Secondary | ICD-10-CM | POA: Insufficient documentation

## 2015-04-12 MED ORDER — IBUPROFEN 800 MG PO TABS: 800.0000 mg | ORAL_TABLET | Freq: Three times a day (TID) | ORAL | Status: DC

## 2015-04-12 MED ORDER — IBUPROFEN 400 MG PO TABS
800.0000 mg | ORAL_TABLET | Freq: Once | ORAL | Status: AC
  Administered 2015-04-12: 800 mg via ORAL
  Filled 2015-04-12: qty 2

## 2015-04-12 MED ORDER — AZITHROMYCIN 250 MG PO TABS: 250.0000 mg | ORAL_TABLET | Freq: Every day | ORAL | Status: DC

## 2015-04-12 MED ORDER — BENZONATATE 100 MG PO CAPS: 100.0000 mg | ORAL_CAPSULE | Freq: Three times a day (TID) | ORAL | Status: DC

## 2015-04-12 NOTE — ED Notes (Signed)
Declined W/C at D/C and was escorted to lobby by RN. 

## 2015-04-12 NOTE — Discharge Instructions (Signed)

## 2015-04-12 NOTE — ED Notes (Signed)
Pt reports having a cough and cold symptoms x 6 weeks. Now also has mild bodyaches and headaches. Denies n/v/d. No distress noted at triage.

## 2015-04-12 NOTE — ED Provider Notes (Signed)
CSN: [id number]     Arrival date & time 04/12/15  1341 History  By signing my name below, I, [name], attest that this documentation has been prepared under the direction and in the presence of Cheri Fowler, PA-C Electronically Signed: [name], ED Scribe. 04/12/2015. 2:09 PM.   Chief Complaint  Patient presents with  . URI      The history is provided by the patient. No language interpreter was used.    Sharon Waller is a 20 y.o. female who presents to the Emergency Department complaining of constant, productive cough onset 6 weeks. She notes that she thought that she initially had a cold but her symptoms have worsened since. She denies any alleviating factors or any factors that make her symptoms worse. She states that she is having associated symptoms of mild generalized body aches, HA, rhinorrhea, CP only when coughing, subjective fever, mild SOB relieved with an inhaler, and sore throat only at night when she lays down. She states that she has tried mucinex and nyquil with no relief for her symptoms. She denies n/v, neck stiffness, ear pain, and any other symptoms. Denies being a smoker.    Past Medical History  Diagnosis Date  . Asthma   . UTI (lower urinary tract infection)   . Bronchitis   . Bacterial vaginosis    History reviewed. No pertinent past surgical history. History reviewed. No pertinent family history. Social History  Substance Use Topics  . Smoking status: Current Every Day Smoker    Types: Cigarettes  . Smokeless tobacco: Never Used  . Alcohol Use: No   OB History    No data available     Review of Systems  Constitutional: Positive for fever (subjective).  HENT: Positive for rhinorrhea and sore throat (at night when laying down). Negative for ear pain.   Respiratory: Positive for cough and shortness of breath (mild. relieved with inhaler).   Cardiovascular: Positive for chest pain (due to cough).  Gastrointestinal: Negative for nausea and  vomiting.  Musculoskeletal: Positive for myalgias. Negative for neck stiffness.  Neurological: Positive for headaches.  All other systems reviewed and are negative.     Allergies  Augmentin  Home Medications   Prior to Admission medications   Medication Sig Start Date End Date Taking? Authorizing Provider  acetaminophen (TYLENOL) 325 MG tablet Take 650 mg by mouth every 6 (six) hours as needed for mild pain or moderate pain.    Historical Provider, MD  azithromycin (ZITHROMAX) 250 MG tablet Take 1 tablet (250 mg total) by mouth daily. Take first 2 tablets together, then 1 every day until finished. 04/12/15   Yzabelle Calles, PA-C  benzonatate (TESSALON) 100 MG capsule Take 1 capsule (100 mg total) by mouth every 8 (eight) hours. 04/12/15   Cheri Fowler, PA-C  cyclobenzaprine (FLEXERIL) 5 MG tablet Take 1 tablet (5 mg total) by mouth 3 (three) times daily as needed for muscle spasms. 01/24/15   Hope Orlene Och, NP  ibuprofen (ADVIL,MOTRIN) 800 MG tablet Take 1 tablet (800 mg total) by mouth 3 (three) times daily. 04/12/15   Cheri Fowler, PA-C  naproxen (NAPROSYN) 375 MG tablet Take 1 tablet (375 mg total) by mouth 2 (two) times daily. 01/24/15   Hope Orlene Och, NP   BP 98/59 mmHg  Pulse 84  Temp(Src) 98.3 F (36.8 C) (Oral)  Resp 18  SpO2 100%  LMP 04/07/2015 Physical Exam  Constitutional: She is oriented to person, place, and time. She appears well-developed and  well-nourished.  Non-toxic appearance. She does not have a sickly appearance. She does not appear ill.  HENT:  Head: Normocephalic and atraumatic.  Mouth/Throat: Oropharynx is clear and moist. No oropharyngeal exudate.  Eyes: Conjunctivae are normal. Pupils are equal, round, and reactive to light.  Neck: Normal range of motion. Neck supple.  Cardiovascular: Normal rate, regular rhythm and normal heart sounds.   No murmur heard. Pulmonary/Chest: Effort normal and breath sounds normal. No accessory muscle usage or stridor. No respiratory  distress. She has no wheezes. She has no rhonchi. She has no rales.  Abdominal: Soft. Bowel sounds are normal. She exhibits no distension. There is no tenderness.  Musculoskeletal: Normal range of motion.  Lymphadenopathy:    She has no cervical adenopathy.  Neurological: She is alert and oriented to person, place, and time.  Speech clear without dysarthria.  Skin: Skin is warm and dry.  Psychiatric: She has a normal mood and affect. Her behavior is normal.  Nursing note and vitals reviewed.   ED Course  Procedures (including critical care time) DIAGNOSTIC STUDIES: Oxygen Saturation is 100% on RA, nl by my interpretation.    COORDINATION OF CARE: 2:08 PM Discussed treatment plan with pt at bedside which includes CXR and 800 mg ibuprofen and pt agreed to plan.    Labs Review Labs Reviewed - No data to display  Imaging Review Dg Chest 2 View  04/12/2015  CLINICAL DATA:  Six week history of productive cough. EXAM: CHEST  2 VIEW COMPARISON:  None. FINDINGS: The lungs are clear wiithout focal pneumonia, edema, pneumothorax or pleural effusion. The cardiopericardial silhouette is within normal limits for size. Convex rightward scoliosis of the lower thoracic spine noted. IMPRESSION: No active cardiopulmonary disease. Electronically Signed   By: Kennith Center M.D.   On: 04/12/2015 14:32   I have personally reviewed and evaluated these images as part of my medical decision-making.   EKG Interpretation None      MDM   Final diagnoses:  Bronchitis    Pt symptoms consistent with bronchitis. CXR negative for acute infiltrate. Pt will be discharged with azithromycin and symptomatic treatment.  She reports she does not need a refill on her inhaler Discussed return precautions.  Pt is hemodynamically stable & in NAD prior to discharge.   I personally performed the services described in this documentation, which was scribed in my presence. The recorded information has been reviewed and  is accurate.    Cheri Fowler, PA-C 04/12/15 1443  Nelva Nay, MD 04/12/15 [phone]

## 2015-05-12 ENCOUNTER — Encounter (HOSPITAL_COMMUNITY): Payer: Self-pay | Admitting: Neurology

## 2015-05-12 ENCOUNTER — Emergency Department (HOSPITAL_COMMUNITY)
Admission: EM | Admit: 2015-05-12 | Discharge: 2015-05-12 | Disposition: A | Discharge status: Home / Self Care | Payer: BLUE CROSS/BLUE SHIELD | Attending: Emergency Medicine | Admitting: Emergency Medicine

## 2015-05-12 DIAGNOSIS — Z3202 Encounter for pregnancy test, result negative: Secondary | ICD-10-CM | POA: Insufficient documentation

## 2015-05-12 DIAGNOSIS — R110 Nausea: Secondary | ICD-10-CM | POA: Insufficient documentation

## 2015-05-12 DIAGNOSIS — B9689 Other specified bacterial agents as the cause of diseases classified elsewhere: Secondary | ICD-10-CM

## 2015-05-12 DIAGNOSIS — R410 Disorientation, unspecified: Secondary | ICD-10-CM | POA: Insufficient documentation

## 2015-05-12 DIAGNOSIS — Z791 Long term (current) use of non-steroidal anti-inflammatories (NSAID): Secondary | ICD-10-CM | POA: Insufficient documentation

## 2015-05-12 DIAGNOSIS — R55 Syncope and collapse: Secondary | ICD-10-CM

## 2015-05-12 DIAGNOSIS — Z202 Contact with and (suspected) exposure to infections with a predominantly sexual mode of transmission: Secondary | ICD-10-CM | POA: Insufficient documentation

## 2015-05-12 DIAGNOSIS — F17210 Nicotine dependence, cigarettes, uncomplicated: Secondary | ICD-10-CM | POA: Insufficient documentation

## 2015-05-12 DIAGNOSIS — N760 Acute vaginitis: Secondary | ICD-10-CM | POA: Insufficient documentation

## 2015-05-12 DIAGNOSIS — Z792 Long term (current) use of antibiotics: Secondary | ICD-10-CM | POA: Insufficient documentation

## 2015-05-12 DIAGNOSIS — R42 Dizziness and giddiness: Secondary | ICD-10-CM | POA: Insufficient documentation

## 2015-05-12 DIAGNOSIS — J45909 Unspecified asthma, uncomplicated: Secondary | ICD-10-CM | POA: Insufficient documentation

## 2015-05-12 DIAGNOSIS — H538 Other visual disturbances: Secondary | ICD-10-CM | POA: Insufficient documentation

## 2015-05-12 DIAGNOSIS — Z87440 Personal history of urinary (tract) infections: Secondary | ICD-10-CM | POA: Insufficient documentation

## 2015-05-12 DIAGNOSIS — R232 Flushing: Secondary | ICD-10-CM | POA: Insufficient documentation

## 2015-05-12 DIAGNOSIS — Z79899 Other long term (current) drug therapy: Secondary | ICD-10-CM | POA: Insufficient documentation

## 2015-05-12 LAB — RAPID HIV SCREEN (HIV 1/2 AB+AG)
HIV 1/2 Antibodies: NONREACTIVE
HIV-1 P24 Antigen - HIV24: NONREACTIVE

## 2015-05-12 LAB — BASIC METABOLIC PANEL
Anion gap: 7 (ref 5–15)
BUN: 11 mg/dL (ref 6–20)
CHLORIDE: 105 mmol/L (ref 101–111)
CO2: 27 mmol/L (ref 22–32)
Calcium: 9.3 mg/dL (ref 8.9–10.3)
Creatinine, Ser: 0.6 mg/dL (ref 0.44–1.00)
GFR calc non Af Amer: >60 mL/min (ref >60)
Glucose, Bld: 97 mg/dL (ref 65–99)
Potassium: 3.7 mmol/L (ref 3.5–5.1)
Sodium: 139 mmol/L (ref 135–145)

## 2015-05-12 LAB — CBC
HCT: 38.1 % (ref 36.0–46.0)
Hemoglobin: 12.7 g/dL (ref 12.0–15.0)
MCH: 31.6 pg (ref 26.0–34.0)
MCHC: 33.3 g/dL (ref 30.0–36.0)
MCV: 94.8 fL (ref 78.0–100.0)
Platelets: 240 10*3/uL (ref 150–400)
RBC: 4.02 MIL/uL (ref 3.87–5.11)
RDW: 13.6 % (ref 11.5–15.5)
WBC: 5.4 10*3/uL (ref 4.0–10.5)

## 2015-05-12 LAB — WET PREP, GENITAL
Sperm: NONE SEEN
TRICH WET PREP: NONE SEEN
Yeast Wet Prep HPF POC: NONE SEEN

## 2015-05-12 LAB — URINE MICROSCOPIC-ADD ON: RBC / HPF: NONE SEEN RBC/hpf (ref 0–5)

## 2015-05-12 LAB — URINALYSIS, ROUTINE W REFLEX MICROSCOPIC
Bilirubin Urine: NEGATIVE
Glucose, UA: NEGATIVE mg/dL
Hgb urine dipstick: NEGATIVE
Ketones, ur: NEGATIVE mg/dL
Nitrite: NEGATIVE
PH: 6.5 (ref 5.0–8.0)
Protein, ur: NEGATIVE mg/dL
Specific Gravity, Urine: 1.009 (ref 1.005–1.030)

## 2015-05-12 LAB — I-STAT BETA HCG BLOOD, ED (MC, WL, AP ONLY): I-stat hCG, quantitative: <5 m[IU]/mL (ref <5)

## 2015-05-12 MED ORDER — SODIUM CHLORIDE 0.9 % IV BOLUS (SEPSIS)
1000.0000 mL | Freq: Once | INTRAVENOUS | Status: AC
  Administered 2015-05-12: 1000 mL via INTRAVENOUS

## 2015-05-12 MED ORDER — METRONIDAZOLE 500 MG PO TABS: 500.0000 mg | ORAL_TABLET | Freq: Two times a day (BID) | ORAL | Status: DC

## 2015-05-12 NOTE — ED Notes (Signed)
Pt reports this morning had syncopal episode while going into the bathroom to take a shower. She felt hot before it happened, witnessed by her boyfriend, who says she was out for a few minutes. Denies injury, because everything is carpeted. Is a x 4. In NAD

## 2015-05-12 NOTE — ED Provider Notes (Signed)
CSN: [id number]     Arrival date & time 05/12/15  7829 History   First MD Initiated Contact with Patient 05/12/15 1025     Chief Complaint  Patient presents with  . Loss of Consciousness     (Consider location/radiation/quality/duration/timing/severity/associated sxs/prior Treatment) HPI 20 year old female who presents with syncopal episode. History of asthma. States that she has recently been stressed due to exams during school, and has not been eating or drinking normally. States that she had routine sexual intercourse with her significant other yesterday evening, and afterwards while getting up to use the restroom she felt lightheaded and flushed. She sat herself back down, and the feeling results. She did not have any loss of consciousness. States that she woke up this morning, and while ambulating to the restroom to have a shower and started feeling similar symptoms of lightheadedness, nausea, feeling hot and flushed, and felt as if her vision was closing in. she was walking towards her bed to sit down, when she had loss of consciousness. Said that she fell forward onto the bed, and woke up initially a little confused. Called out to her boyfriend who is in the bathroom, and was subsequently brought to the ED for evaluation. Did not have any preceding chest pain, difficulty breathing, abdominal pain, headache, vomiting. States that she has been having sexual intercourse that has been unprotected, and has been having vaginal discharge. Also requesting STD testing today. Past Medical History  Diagnosis Date  . Asthma   . UTI (lower urinary tract infection)   . Bronchitis   . Bacterial vaginosis    History reviewed. No pertinent past surgical history. No family history on file. Social History  Substance Use Topics  . Smoking status: Current Every Day Smoker    Types: Cigarettes  . Smokeless tobacco: Never Used  . Alcohol Use: No   OB History    No data available     Review of  Systems 10/14 systems reviewed and are negative other than those stated in the HPI   Allergies  Augmentin  Home Medications   Prior to Admission medications   Medication Sig Start Date End Date Taking? Authorizing Provider  acetaminophen (TYLENOL) 325 MG tablet Take 650 mg by mouth every 6 (six) hours as needed for mild pain or moderate pain.    Historical Provider, MD  azithromycin (ZITHROMAX) 250 MG tablet Take 1 tablet (250 mg total) by mouth daily. Take first 2 tablets together, then 1 every day until finished. 04/12/15   Kayla Rose, PA-C  benzonatate (TESSALON) 100 MG capsule Take 1 capsule (100 mg total) by mouth every 8 (eight) hours. 04/12/15   Cheri Fowler, PA-C  cyclobenzaprine (FLEXERIL) 5 MG tablet Take 1 tablet (5 mg total) by mouth 3 (three) times daily as needed for muscle spasms. 01/24/15   Hope Orlene Och, NP  ibuprofen (ADVIL,MOTRIN) 800 MG tablet Take 1 tablet (800 mg total) by mouth 3 (three) times daily. 04/12/15   Cheri Fowler, PA-C  metroNIDAZOLE (FLAGYL) 500 MG tablet Take 1 tablet (500 mg total) by mouth 2 (two) times daily. 05/12/15   Lavera Guise, MD  naproxen (NAPROSYN) 375 MG tablet Take 1 tablet (375 mg total) by mouth 2 (two) times daily. 01/24/15   Hope Orlene Och, NP   BP 103/73 mmHg  Pulse 53  Temp(Src) 98.1 F (36.7 C) (Oral)  Resp 14  SpO2 100%  LMP 05/04/2015 Physical Exam Physical Exam  Nursing note and vitals reviewed. Constitutional: Well developed, well nourished,  non-toxic, and in no acute distress Head: Normocephalic and atraumatic.  Mouth/Throat: Oropharynx is clear and moist.  Neck: Normal range of motion. Neck supple.  Cardiovascular: Normal rate and regular rhythm.   Pulmonary/Chest: Effort normal and breath sounds normal.  Abdominal: Soft. There is no tenderness. There is no rebound and no guarding.  Pelvic: Normal external genitalia. Normal internal genitalia. White vaginal discharge. No blood within the vagina. No cervical motion tenderness. No  adnexal masses or tenderness. Musculoskeletal: Normal range of motion.  Neurological: Alert, no facial droop, fluent speech, moves all extremities symmetrically Skin: Skin is warm and dry.  Psychiatric: Cooperative  ED Course  Procedures (including critical care time) Labs Review Labs Reviewed  WET PREP, GENITAL - Abnormal; Notable for the following:    Clue Cells Wet Prep HPF POC PRESENT (*)    WBC, Wet Prep HPF POC MANY (*)    All other components within normal limits  URINALYSIS, ROUTINE W REFLEX MICROSCOPIC (NOT AT [facility]) - Abnormal; Notable for the following:    Leukocytes, UA TRACE (*)    All other components within normal limits  URINE MICROSCOPIC-ADD ON - Abnormal; Notable for the following:    Squamous Epithelial / LPF 0-5 (*)    Bacteria, UA RARE (*)    All other components within normal limits  URINE CULTURE  BASIC METABOLIC PANEL  CBC  RAPID HIV SCREEN (HIV 1/2 AB+AG)  RPR  I-STAT BETA HCG BLOOD, ED ([facility], [facility], [facility] ONLY)  GC/CHLAMYDIA PROBE AMP ([place]) NOT AT [facility]    Imaging Review No results found. I have personally reviewed and evaluated these images and lab results as part of my medical decision-making.   EKG Interpretation   Date/Time:  Tuesday May 12 2015 10:14:27 EST Ventricular Rate:  60 PR Interval:  132 QRS Duration: 82 QT Interval:  368 QTC Calculation: 368 R Axis:   70 Text Interpretation:  Normal sinus rhythm Nonspecific T wave abnormality  Abnormal ECG No prior EKG  Confirmed by Kross Swallows MD, Telitha Plath ([id number]) on 05/12/2015  10:47:55 AM      MDM   Final diagnoses:  Syncope and collapse  Bacterial vaginosis    20 year old female who presents with syncope, likely benign in nature. She is well-appearing with stable vital signs. Exam overall unremarkable. Her EKG does not show any stigmata of arrhythmia or heart strain. Her basic blood work reveals no major electrolyte or metabolic derangements. Overall unremarkable workup, and I suspect that  her syncope is likely vasovagal in nature. Do not suspect cardiogenic or other serious cause.   She also incidentally reports that she has vaginal discharge recently without abdominal/adnexal pain or other symptoms of PID. States that she would like to be evaluated for STDs, but refuses empiric treatment. Pelvic exam overall unremarkable without evidence of adnexal or cervical motion tenderness/mass. She is positive for BV and given a course of Flagyl.  At this time she is stable for discharge home. I do not suspect serious or life-threatening illness at this time. Reviewed strict return and follow-up instructions. She expressed understanding of all discharge instructions, and felt comfortable with the plan of care.    Lavera Guise, MD 05/12/15 1247

## 2015-05-12 NOTE — ED Notes (Signed)
Pt ambulates independently and with steady gait at time of discharge. Discharge instructions and follow up information reviewed with patient. No other questions or concerns voiced at this time. RX x 1 given. 

## 2015-05-12 NOTE — Discharge Instructions (Signed)
There does not appear to be a serious cause of your fainting today. Keep well nourished and well hydrated. Return without fail for worsening symptoms, including passing out or having severe chest pain or shortness of breath during exertional activities, confusion, severe abdominal pain, or any other symptoms concerning to you.  Bacterial Vaginosis Bacterial vaginosis is a vaginal infection that occurs when the normal balance of bacteria in the vagina is disrupted. It results from an overgrowth of certain bacteria. This is the most common vaginal infection in women of childbearing age. Treatment is important to prevent complications, especially in pregnant women, as it can cause a premature delivery. CAUSES  Bacterial vaginosis is caused by an increase in harmful bacteria that are normally present in smaller amounts in the vagina. Several different kinds of bacteria can cause bacterial vaginosis. However, the reason that the condition develops is not fully understood. RISK FACTORS Certain activities or behaviors can put you at an increased risk of developing bacterial vaginosis, including:  Having a new sex partner or multiple sex partners.  Douching.  Using an intrauterine device (IUD) for contraception. Women do not get bacterial vaginosis from toilet seats, bedding, swimming pools, or contact with objects around them. SIGNS AND SYMPTOMS  Some women with bacterial vaginosis have no signs or symptoms. Common symptoms include:  Grey vaginal discharge.  A fishlike odor with discharge, especially after sexual intercourse.  Itching or burning of the vagina and vulva.  Burning or pain with urination. DIAGNOSIS  Your health care provider will take a medical history and examine the vagina for signs of bacterial vaginosis. A sample of vaginal fluid may be taken. Your health care provider will look at this sample under a microscope to check for bacteria and abnormal cells. A vaginal pH test may also  be done.  TREATMENT  Bacterial vaginosis may be treated with antibiotic medicines. These may be given in the form of a pill or a vaginal cream. A second round of antibiotics may be prescribed if the condition comes back after treatment. Because bacterial vaginosis increases your risk for sexually transmitted diseases, getting treated can help reduce your risk for chlamydia, gonorrhea, HIV, and herpes. HOME CARE INSTRUCTIONS   Only take over-the-counter or prescription medicines as directed by your health care provider.  If antibiotic medicine was prescribed, take it as directed. Make sure you finish it even if you start to feel better.  Tell all sexual partners that you have a vaginal infection. They should see their health care provider and be treated if they have problems, such as a mild rash or itching.  During treatment, it is important that you follow these instructions:  Avoid sexual activity or use condoms correctly.  Do not douche.  Avoid alcohol as directed by your health care provider.  Avoid breastfeeding as directed by your health care provider. SEEK MEDICAL CARE IF:   Your symptoms are not improving after 3 days of treatment.  You have increased discharge or pain.  You have a fever. MAKE SURE YOU:   Understand these instructions.  Will watch your condition.  Will get help right away if you are not doing well or get worse. FOR MORE INFORMATION  Centers for Disease Control and Prevention, Division of STD Prevention: [employer] American Sexual Health Association (ASHA): www.ashastd.org    This information is not intended to replace advice given to you by your health care provider. Make sure you discuss any questions you have with your health care provider.   Document  Released: 02/28/2005 Document Revised: 03/21/2014 Document Reviewed: 10/10/2012 Elsevier Interactive Patient Education 2016 [employer].  Syncope, commonly known as fainting, is a temporary  loss of consciousness. It occurs when the blood flow to the brain is reduced. Vasovagal syncope (also called neurocardiogenic syncope) is a fainting spell in which the blood flow to the brain is reduced because of a sudden drop in heart rate and blood pressure. Vasovagal syncope occurs when the brain and the cardiovascular system (blood vessels) do not adequately communicate and respond to each other. This is the most common cause of fainting. It often occurs in response to fear or some other type of emotional or physical stress. The body has a reaction in which the heart starts beating too slowly or the blood vessels expand, reducing blood pressure. This type of fainting spell is generally considered harmless. However, injuries can occur if a person takes a sudden fall during a fainting spell.  CAUSES  Vasovagal syncope occurs when a person's blood pressure and heart rate decrease suddenly, usually in response to a trigger. Many things and situations can trigger an episode. Some of these include:   Pain.   Fear.   The sight of blood or medical procedures, such as blood being drawn from a vein.   Common activities, such as coughing, swallowing, stretching, or going to the bathroom.   Emotional stress.   Prolonged standing, especially in a warm environment.   Lack of sleep or rest.   Prolonged lack of food.   Prolonged lack of fluids.   Recent illness.  The use of certain drugs that affect blood pressure, such as cocaine, alcohol, marijuana, inhalants, and opiates.  SYMPTOMS  Before the fainting episode, you may:   Feel dizzy or light headed.   Become pale.  Sense that you are going to faint.   Feel like the room is spinning.   Have tunnel vision, only seeing directly in front of you.   Feel sick to your stomach (nauseous).   See spots or slowly lose vision.   Hear ringing in your ears.   Have a headache.   Feel warm and sweaty.   Feel a sensation of  pins and needles. During the fainting spell, you will generally be unconscious for no longer than a couple minutes before waking up and returning to normal. If you get up too quickly before your body can recover, you may faint again. Some twitching or jerky movements may occur during the fainting spell.  DIAGNOSIS  Your health care provider will ask about your symptoms, take a medical history, and perform a physical exam. Various tests may be done to rule out other causes of fainting. These may include blood tests and tests to check the heart, such as electrocardiography, echocardiography, and possibly an electrophysiology study. When other causes have been ruled out, a test may be done to check the body's response to changes in position (tilt table test). TREATMENT  Most cases of vasovagal syncope do not require treatment. Your health care provider may recommend ways to avoid fainting triggers and may provide home strategies for preventing fainting. If you must be exposed to a possible trigger, you can drink additional fluids to help reduce your chances of having an episode of vasovagal syncope. If you have warning signs of an oncoming episode, you can respond by positioning yourself favorably (lying down). If your fainting spells continue, you may be given medicines to prevent fainting. Some medicines may help make you more resistant to repeated  episodes of vasovagal syncope. Special exercises or compression stockings may be recommended. In rare cases, the surgical placement of a pacemaker is considered. HOME CARE INSTRUCTIONS   Learn to identify the warning signs of vasovagal syncope.   Sit or lie down at the first warning sign of a fainting spell. If sitting, put your head down between your legs. If you lie down, swing your legs up in the air to increase blood flow to the brain.   Avoid hot tubs and saunas.  Avoid prolonged standing.  Drink enough fluids to keep your urine clear or pale  yellow. Avoid caffeine.  Increase salt in your diet as directed by your health care provider.   If you have to stand for a long time, perform movements such as:   Crossing your legs.   Flexing and stretching your leg muscles.   Squatting.   Moving your legs.   Bending over.   Only take over-the-counter or prescription medicines as directed by your health care provider. Do not suddenly stop any medicines without asking your health care provider first. SEEK MEDICAL CARE IF:   Your fainting spells continue or happen more frequently in spite of treatment.   You lose consciousness for more than a couple minutes.  You have fainting spells during or after exercising or after being startled.   You have new symptoms that occur with the fainting spells, such as:   Shortness of breath.  Chest pain.   Irregular heartbeat.   You have episodes of twitching or jerky movements that last longer than a few seconds.  You have episodes of twitching or jerky movements without obvious fainting. SEEK IMMEDIATE MEDICAL CARE IF:   You have injuries or bleeding after a fainting spell.   You have episodes of twitching or jerky movements that last longer than 5 minutes.   You have more than one spell of twitching or jerky movements before returning to consciousness after fainting.   This information is not intended to replace advice given to you by your health care provider. Make sure you discuss any questions you have with your health care provider.   Document Released: 02/15/2012 Document Revised: 07/15/2014 Document Reviewed: 02/15/2012 Elsevier Interactive Patient Education [employer]

## 2015-05-13 LAB — GC/CHLAMYDIA PROBE AMP (~~LOC~~) NOT AT ARMC
CHLAMYDIA, DNA PROBE: NEGATIVE
Neisseria Gonorrhea: NEGATIVE

## 2015-05-13 LAB — URINE CULTURE

## 2015-05-13 LAB — RPR: RPR: NONREACTIVE

## 2015-08-28 ENCOUNTER — Emergency Department (HOSPITAL_COMMUNITY)
Admission: EM | Admit: 2015-08-28 | Discharge: 2015-08-28 | Disposition: A | Discharge status: Home / Self Care | Payer: BLUE CROSS/BLUE SHIELD | Attending: Emergency Medicine | Admitting: Emergency Medicine

## 2015-08-28 ENCOUNTER — Encounter (HOSPITAL_COMMUNITY): Payer: Self-pay

## 2015-08-28 DIAGNOSIS — Z79899 Other long term (current) drug therapy: Secondary | ICD-10-CM | POA: Insufficient documentation

## 2015-08-28 DIAGNOSIS — J45909 Unspecified asthma, uncomplicated: Secondary | ICD-10-CM | POA: Insufficient documentation

## 2015-08-28 DIAGNOSIS — J019 Acute sinusitis, unspecified: Secondary | ICD-10-CM | POA: Insufficient documentation

## 2015-08-28 DIAGNOSIS — Z87891 Personal history of nicotine dependence: Secondary | ICD-10-CM | POA: Insufficient documentation

## 2015-08-28 MED ORDER — SULFAMETHOXAZOLE-TRIMETHOPRIM 800-160 MG PO TABS: 1.0000 | ORAL_TABLET | Freq: Two times a day (BID) | ORAL | Status: AC

## 2015-08-28 MED ORDER — FLUTICASONE PROPIONATE 50 MCG/ACT NA SUSP: 1.0000 | Freq: Two times a day (BID) | NASAL | Status: DC

## 2015-08-28 NOTE — ED Provider Notes (Signed)
CSN: [id number]     Arrival date & time 08/28/15  1539 History   First MD Initiated Contact with Patient 08/28/15 1928     Chief Complaint  Patient presents with  . Migraine     (Consider location/radiation/quality/duration/timing/severity/associated sxs/prior Treatment) HPI   Dallis I Hilda Bladesrmstrong is a 20 y.o. female presents for evaluation of headache intermittently for 2 weeks, between eyes. No ear pain, nasal discharge, sore throat, weakness or dizziness. She had previously a similar problem and took cold medicine without relief. No nausea, vomiting, or change in bowel and urinary habits. There are no other known modifying factors   Past Medical History  Diagnosis Date  . Asthma   . UTI (lower urinary tract infection)   . Bronchitis   . Bacterial vaginosis    History reviewed. No pertinent past surgical history. No family history on file. Social History  Substance Use Topics  . Smoking status: Former Smoker    Types: Cigarettes  . Smokeless tobacco: Never Used  . Alcohol Use: No   OB History    No data available     Review of Systems  All other systems reviewed and are negative.     Allergies  Augmentin  Home Medications   Prior to Admission medications   Medication Sig Start Date End Date Taking? Authorizing Provider  diphenhydrAMINE (SOMINEX) 25 MG tablet Take 25 mg by mouth at bedtime as needed for itching or sleep.   Yes Historical Provider, MD  ibuprofen (ADVIL,MOTRIN) 200 MG tablet Take 200 mg by mouth every 6 (six) hours as needed for moderate pain.    Yes Historical Provider, MD  azithromycin (ZITHROMAX) 250 MG tablet Take 1 tablet (250 mg total) by mouth daily. Take first 2 tablets together, then 1 every day until finished. Patient not taking: Reported on 08/28/2015 04/12/15   Cheri FowlerKayla Rose, PA-C  benzonatate (TESSALON) 100 MG capsule Take 1 capsule (100 mg total) by mouth every 8 (eight) hours. Patient not taking: Reported on 08/28/2015 04/12/15   Cheri FowlerKayla Rose,  PA-C  cyclobenzaprine (FLEXERIL) 5 MG tablet Take 1 tablet (5 mg total) by mouth 3 (three) times daily as needed for muscle spasms. Patient not taking: Reported on 08/28/2015 01/24/15   Janne NapoleonHope M Neese, NP  fluticasone [facility](FLONASE) 50 MCG/ACT nasal spray Place 1 spray into both nostrils 2 (two) times daily. 08/28/15   Mancel BaleElliott Makalia Bare, MD  ibuprofen (ADVIL,MOTRIN) 800 MG tablet Take 1 tablet (800 mg total) by mouth 3 (three) times daily. Patient not taking: Reported on 08/28/2015 04/12/15   Cheri FowlerKayla Rose, PA-C  metroNIDAZOLE (FLAGYL) 500 MG tablet Take 1 tablet (500 mg total) by mouth 2 (two) times daily. Patient not taking: Reported on 08/28/2015 05/12/15   Lavera Guiseana Duo Liu, MD  naproxen (NAPROSYN) 375 MG tablet Take 1 tablet (375 mg total) by mouth 2 (two) times daily. Patient not taking: Reported on 08/28/2015 01/24/15   Janne NapoleonHope M Neese, NP  sulfamethoxazole-trimethoprim (BACTRIM DS,SEPTRA DS) 800-160 MG tablet Take 1 tablet by mouth 2 (two) times daily. 08/28/15 09/04/15  Mancel BaleElliott Liesl Simons, MD   BP 119/65 mmHg  Pulse 55  Temp(Src) 98.3 F (36.8 C) (Oral)  Resp 16  Ht 5\' 6"  (1.676 m)  Wt 110 lb 9.6 oz (50.168 kg)  BMI 17.86 kg/m2  SpO2 100%  LMP 08/09/2015 Physical Exam  Constitutional: She is oriented to person, place, and time. She appears well-developed and well-nourished.  HENT:  Head: Normocephalic and atraumatic.  Right Ear: External ear normal.  Left Ear: External  ear normal.  Eyes: Conjunctivae and EOM are normal. Pupils are equal, round, and reactive to light.  Neck: Normal range of motion and phonation normal. Neck supple.  Cardiovascular: Normal rate, regular rhythm and normal heart sounds.   Pulmonary/Chest: Effort normal and breath sounds normal. She exhibits no bony tenderness.  Abdominal: Soft. There is no tenderness.  Musculoskeletal: Normal range of motion.  Neurological: She is alert and oriented to person, place, and time. No cranial nerve deficit or sensory deficit. She exhibits normal  muscle tone. Coordination normal.  No dysarthria and aphasia or nystagmus. No pronator drift. Romberg negative.  Skin: Skin is warm, dry and intact.  Psychiatric: She has a normal mood and affect. Her behavior is normal. Judgment and thought content normal.  Nursing note and vitals reviewed.   ED Course  Procedures (including critical care time)  Medications - No data to display  Patient Vitals for the past 24 hrs:  BP Temp Temp src Pulse Resp SpO2 Height Weight  08/28/15 1956 119/65 mmHg - - (!) 55 16 100 % - -  08/28/15 1900 106/72 mmHg - - (!) 57 - 100 % - -  08/28/15 1830 98/77 mmHg - - 61 - 100 % - -  08/28/15 1545 105/78 mmHg 98.3 F (36.8 C) Oral 89 16 94 %  (1.676 m) 110 lb 9.6 oz (50.168 kg)       Labs Review Labs Reviewed - No data to display  Imaging Review No results found. I have personally reviewed and evaluated these images and lab results as part of my medical decision-making.   EKG Interpretation None      MDM   Final diagnoses:  Acute sinusitis, recurrence not specified, unspecified location   Evaluation is consistent with acute sinusitis. Doubt meningitis, intracranial bleeding, serous bacterial infection or metabolic instability.  Nursing Notes Reviewed/ Care Coordinated Applicable Imaging Reviewed Interpretation of Laboratory Data incorporated into ED treatment  The patient appears reasonably screened and/or stabilized for discharge and I doubt any other medical condition or other [facility] requiring further screening, evaluation, or treatment in the ED at this time prior to discharge.  Plan: Home Medications- Flonase, Septra; Home Treatments- rest, fluids; return here if the recommended treatment, does not improve the symptoms; Recommended follow up- PCP prn    Mancel Bale, MD 08/28/15 2306

## 2015-08-28 NOTE — ED Notes (Signed)
Per Pt, Pt has had a migraine for two weeks. Pt reports trying to take tylenol and sinus medication with no relief. Denies any cold symptoms or nausea, vomting, diarrhea. Denies blurred vision or neurological changes.

## 2015-08-28 NOTE — Discharge Instructions (Signed)

## 2015-09-15 ENCOUNTER — Emergency Department (HOSPITAL_COMMUNITY): Payer: Medicaid Other

## 2015-09-15 ENCOUNTER — Emergency Department (HOSPITAL_COMMUNITY)
Admission: EM | Admit: 2015-09-15 | Discharge: 2015-09-16 | Disposition: A | Discharge status: Home / Self Care | Payer: Medicaid Other | Attending: Emergency Medicine | Admitting: Emergency Medicine

## 2015-09-15 ENCOUNTER — Encounter (HOSPITAL_COMMUNITY): Payer: Self-pay | Admitting: Emergency Medicine

## 2015-09-15 DIAGNOSIS — N760 Acute vaginitis: Secondary | ICD-10-CM

## 2015-09-15 DIAGNOSIS — Z87891 Personal history of nicotine dependence: Secondary | ICD-10-CM | POA: Insufficient documentation

## 2015-09-15 DIAGNOSIS — Z3A01 Less than 8 weeks gestation of pregnancy: Secondary | ICD-10-CM | POA: Insufficient documentation

## 2015-09-15 DIAGNOSIS — Z3490 Encounter for supervision of normal pregnancy, unspecified, unspecified trimester: Secondary | ICD-10-CM

## 2015-09-15 DIAGNOSIS — B9689 Other specified bacterial agents as the cause of diseases classified elsewhere: Secondary | ICD-10-CM | POA: Insufficient documentation

## 2015-09-15 DIAGNOSIS — O23591 Infection of other part of genital tract in pregnancy, first trimester: Secondary | ICD-10-CM | POA: Diagnosis not present

## 2015-09-15 DIAGNOSIS — O26891 Other specified pregnancy related conditions, first trimester: Secondary | ICD-10-CM | POA: Diagnosis present

## 2015-09-15 DIAGNOSIS — J45909 Unspecified asthma, uncomplicated: Secondary | ICD-10-CM | POA: Insufficient documentation

## 2015-09-15 DIAGNOSIS — R102 Pelvic and perineal pain: Secondary | ICD-10-CM | POA: Insufficient documentation

## 2015-09-15 LAB — COMPREHENSIVE METABOLIC PANEL
ALK PHOS: 43 U/L (ref 38–126)
ALT: 15 U/L (ref 14–54)
AST: 18 U/L (ref 15–41)
Albumin: 3.8 g/dL (ref 3.5–5.0)
Anion gap: 6 (ref 5–15)
BUN: 8 mg/dL (ref 6–20)
CALCIUM: 9.6 mg/dL (ref 8.9–10.3)
CO2: 24 mmol/L (ref 22–32)
CREATININE: 0.57 mg/dL (ref 0.44–1.00)
Chloride: 105 mmol/L (ref 101–111)
Glucose, Bld: 94 mg/dL (ref 65–99)
Potassium: 3.5 mmol/L (ref 3.5–5.1)
SODIUM: 135 mmol/L (ref 135–145)
Total Bilirubin: 0.5 mg/dL (ref 0.3–1.2)
Total Protein: 6.8 g/dL (ref 6.5–8.1)

## 2015-09-15 LAB — URINALYSIS, ROUTINE W REFLEX MICROSCOPIC
Bilirubin Urine: NEGATIVE
Glucose, UA: NEGATIVE mg/dL
HGB URINE DIPSTICK: NEGATIVE
KETONES UR: NEGATIVE mg/dL
Leukocytes, UA: NEGATIVE
Nitrite: NEGATIVE
PROTEIN: NEGATIVE mg/dL
Specific Gravity, Urine: 1.031 — ABNORMAL HIGH (ref 1.005–1.030)
pH: 6 (ref 5.0–8.0)

## 2015-09-15 LAB — CBC
HCT: 36.8 % (ref 36.0–46.0)
Hemoglobin: 12.1 g/dL (ref 12.0–15.0)
MCH: 30.6 pg (ref 26.0–34.0)
MCHC: 32.9 g/dL (ref 30.0–36.0)
MCV: 92.9 fL (ref 78.0–100.0)
PLATELETS: 285 10*3/uL (ref 150–400)
RBC: 3.96 MIL/uL (ref 3.87–5.11)
RDW: 13.4 % (ref 11.5–15.5)
WBC: 9 10*3/uL (ref 4.0–10.5)

## 2015-09-15 LAB — WET PREP, GENITAL
Sperm: NONE SEEN
TRICH WET PREP: NONE SEEN
Yeast Wet Prep HPF POC: NONE SEEN

## 2015-09-15 LAB — HCG, QUANTITATIVE, PREGNANCY: hCG, Beta Chain, Quant, S: 25759 m[IU]/mL — ABNORMAL HIGH (ref <5)

## 2015-09-15 LAB — LIPASE, BLOOD: LIPASE: 32 U/L (ref 11–51)

## 2015-09-15 LAB — POC URINE PREG, ED: PREG TEST UR: POSITIVE — AB

## 2015-09-15 MED ORDER — PRENATAL COMPLETE 14-0.4 MG PO TABS: 1.0000 | ORAL_TABLET | Freq: Every day | ORAL | Status: DC

## 2015-09-15 MED ORDER — METRONIDAZOLE 500 MG PO TABS: 500.0000 mg | ORAL_TABLET | Freq: Three times a day (TID) | ORAL | Status: DC

## 2015-09-15 NOTE — ED Provider Notes (Signed)
CSN: [id number]     Arrival date & time 09/15/15  2101 History   First MD Initiated Contact with Patient 09/15/15 2112     Chief Complaint  Patient presents with  . Abdominal Pain     (Consider location/radiation/quality/duration/timing/severity/associated sxs/prior Treatment) Patient is a 20 y.o. female presenting with abdominal pain. The history is provided by the patient.  Abdominal Pain Pain location: right pelvic. Pain quality: sharp and shooting   Pain radiates to:  Does not radiate Pain severity:  Moderate Onset quality:  Gradual Duration:  4 days Timing:  Intermittent Progression:  Waxing and waning Chronicity:  New Context comment:  Pt is currently [redacted] weeks pregnant by LMP and noted right pelvic pain intermittent for the last 3-4 days and worsening in severity Relieved by:  Nothing Worsened by:  Nothing tried Ineffective treatments:  Acetaminophen Associated symptoms: nausea   Associated symptoms: no anorexia, no chest pain, no cough, no diarrhea, no dysuria, no hematuria and no vomiting   Associated symptoms comment:  Urinary frequency.  Pt states she has asthma and typically uses her inhaler but has not been using the inhaler because she didn't know if it was safe for baby Risk factors: pregnancy   Risk factors: has not had multiple surgeries     Past Medical History  Diagnosis Date  . Asthma   . UTI (lower urinary tract infection)   . Bronchitis   . Bacterial vaginosis    History reviewed. No pertinent past surgical history. No family history on file. Social History  Substance Use Topics  . Smoking status: Former Smoker    Types: Cigarettes  . Smokeless tobacco: Never Used  . Alcohol Use: No   OB History    No data available     Review of Systems  Respiratory: Negative for cough.   Cardiovascular: Negative for chest pain.  Gastrointestinal: Positive for nausea and abdominal pain. Negative for vomiting, diarrhea and anorexia.  Genitourinary: Negative  for dysuria and hematuria.  All other systems reviewed and are negative.     Allergies  Augmentin  Home Medications   Prior to Admission medications   Medication Sig Start Date End Date Taking? Authorizing Provider  azithromycin (ZITHROMAX) 250 MG tablet Take 1 tablet (250 mg total) by mouth daily. Take first 2 tablets together, then 1 every day until finished. Patient not taking: Reported on 08/28/2015 04/12/15   Cheri FowlerKayla Rose, PA-C  benzonatate (TESSALON) 100 MG capsule Take 1 capsule (100 mg total) by mouth every 8 (eight) hours. Patient not taking: Reported on 08/28/2015 04/12/15   Cheri FowlerKayla Rose, PA-C  cyclobenzaprine (FLEXERIL) 5 MG tablet Take 1 tablet (5 mg total) by mouth 3 (three) times daily as needed for muscle spasms. Patient not taking: Reported on 08/28/2015 01/24/15   Janne NapoleonHope M Neese, NP  diphenhydrAMINE (SOMINEX) 25 MG tablet Take 25 mg by mouth at bedtime as needed for itching or sleep.    Historical Provider, MD  fluticasone (FLONASE) 50 MCG/ACT nasal spray Place 1 spray into both nostrils 2 (two) times daily. 08/28/15   Mancel BaleElliott Wentz, MD  ibuprofen (ADVIL,MOTRIN) 200 MG tablet Take 200 mg by mouth every 6 (six) hours as needed for moderate pain.     Historical Provider, MD  ibuprofen (ADVIL,MOTRIN) 800 MG tablet Take 1 tablet (800 mg total) by mouth 3 (three) times daily. Patient not taking: Reported on 08/28/2015 04/12/15   Cheri FowlerKayla Rose, PA-C  metroNIDAZOLE (FLAGYL) 500 MG tablet Take 1 tablet (500 mg total) by mouth 2 (  two) times daily. Patient not taking: Reported on 08/28/2015 05/12/15   Lavera Guiseana Duo Liu, MD  naproxen (NAPROSYN) 375 MG tablet Take 1 tablet (375 mg total) by mouth 2 (two) times daily. Patient not taking: Reported on 08/28/2015 01/24/15   Janne NapoleonHope M Neese, NP   BP 115/77 mmHg  Pulse 94  Temp(Src) 98.4 F (36.9 C) (Oral)  Resp 16  Ht 5\' 6"  (1.676 m)  Wt 110 lb (49.896 kg)  BMI 17.76 kg/m2  SpO2 100%  LMP 09/08/2015 Physical Exam  Constitutional: She is oriented to  person, place, and time. She appears well-developed and well-nourished. No distress.  HENT:  Head: Normocephalic and atraumatic.  Mouth/Throat: Oropharynx is clear and moist.  Eyes: Conjunctivae and EOM are normal. Pupils are equal, round, and reactive to light.  Neck: Normal range of motion. Neck supple.  Cardiovascular: Normal rate, regular rhythm and intact distal pulses.   No murmur heard. Pulmonary/Chest: Effort normal and breath sounds normal. No respiratory distress. She has no wheezes. She has no rales.  Abdominal: Soft. She exhibits no distension. There is no tenderness. There is no rebound and no guarding.  Genitourinary: Uterus is enlarged. Cervix exhibits discharge. Cervix exhibits no motion tenderness and no friability. Right adnexum displays no mass, no tenderness and no fullness. Left adnexum displays no mass, no tenderness and no fullness. No tenderness or bleeding in the vagina. Vaginal discharge found.  Thick white vaginal discharge  Musculoskeletal: Normal range of motion. She exhibits no edema or tenderness.  Neurological: She is alert and oriented to person, place, and time.  Skin: Skin is warm and dry. No rash noted. No erythema.  Psychiatric: She has a normal mood and affect. Her behavior is normal.  Nursing note and vitals reviewed.   ED Course  Procedures (including critical care time) Labs Review Labs Reviewed  CBC  LIPASE, BLOOD  COMPREHENSIVE METABOLIC PANEL  URINALYSIS, ROUTINE W REFLEX MICROSCOPIC (NOT AT [facility])  HCG, QUANTITATIVE, PREGNANCY  I-STAT BETA HCG BLOOD, ED ([facility], [facility], [facility] ONLY)    Imaging Review No results found. I have personally reviewed and evaluated these images and lab results as part of my medical decision-making.   EKG Interpretation None      MDM   Final diagnoses:  None    Patient is a 20 year old female with a history of asthma presenting today with right pelvic tenderness. She is approximately [redacted] weeks pregnant by last  menstrual period has been having intermittent right pelvic pain that is worsening. She does have a thick discharge without itching or burning. She denies any urinary symptoms. She is G1 P0 and denies any vaginal bleeding.  On exam patient has no reproducible pain at this time. No adnexal tenderness. Discharge present on pelvic exam. Urine pregnancy test is positive. CBC, CMP and lipase were ordered prior to patient coming to the room. She displays no signs or symptoms concerning for appendicitis. Will do a pelvic ultrasound to ensure there is no ectopic pregnancy. HCG, GC chlamydia, wet prep and UA pending    Gwyneth SproutWhitney Kelsye Loomer, MD 09/23/15 [id number]

## 2015-09-15 NOTE — ED Notes (Signed)
Phlebotomy at bedside at this time.

## 2015-09-15 NOTE — ED Notes (Signed)
Pelvic exam completed by Dr. Anitra LauthPlunkett with RN assisting at bedside.

## 2015-09-15 NOTE — ED Notes (Signed)
Dr. Plunkett at bedside at this time.  

## 2015-09-15 NOTE — ED Notes (Signed)
Pt. reports low abdominal pain with nausea onset this week , denies emesis or diarrhea . No fever or chills , pt. stated that she is pregnant , denies vaginal bleeding or discharge .

## 2015-09-15 NOTE — Discharge Instructions (Signed)
Sharon PREGNANCY IS IN THE UTERUS AND APPEARS TO BE APPROXIMATELY 5 WEEKS. IT IS RECOMMENDED THAT YOU FOLLOW UP WITH [facility] IN [facility] HOURS FOR A REPEAT BLOOD PREGNANCY TEST TO INSURE THE PREGNANCY IS PROGRESSING NORMALLY.   Bacterial Vaginosis Bacterial vaginosis is a vaginal infection that occurs when the normal balance of bacteria in the vagina is disrupted. It results from an overgrowth of certain bacteria. This is the most common vaginal infection in women of childbearing age. Treatment is important to prevent complications, especially in pregnant women, as it can cause a premature delivery. CAUSES  Bacterial vaginosis is caused by an increase in harmful bacteria that are normally present in smaller amounts in the vagina. Several different kinds of bacteria can cause bacterial vaginosis. However, the reason that the condition develops is not fully understood. RISK FACTORS Certain activities or behaviors can put you at an increased risk of developing bacterial vaginosis, including:  Having a new sex partner or multiple sex partners.  Douching.  Using an intrauterine device (IUD) for contraception. Women do not get bacterial vaginosis from toilet seats, bedding, swimming pools, or contact with objects around them. SIGNS AND SYMPTOMS  Some women with bacterial vaginosis have no signs or symptoms. Common symptoms include:  Grey vaginal discharge.  A fishlike odor with discharge, especially after sexual intercourse.  Itching or burning of the vagina and vulva.  Burning or pain with urination. DIAGNOSIS  Sharon health care provider will take a medical history and examine the vagina for signs of bacterial vaginosis. A sample of vaginal fluid may be taken. Sharon health care provider will look at this sample under a microscope to check for bacteria and abnormal cells. A vaginal pH test may also be done.  TREATMENT  Bacterial vaginosis may be treated with antibiotic medicines. These may be  given in the form of a pill or a vaginal cream. A second round of antibiotics may be prescribed if the condition comes back after treatment. Because bacterial vaginosis increases Sharon risk for sexually transmitted diseases, getting treated can help reduce Sharon risk for chlamydia, gonorrhea, HIV, and herpes. HOME CARE INSTRUCTIONS   Only take over-the-counter or prescription medicines as directed by Sharon health care provider.  If antibiotic medicine was prescribed, take it as directed. Make sure you finish it even if you start to feel better.  Tell all sexual partners that you have a vaginal infection. They should see their health care provider and be treated if they have problems, such as a mild rash or itching.  During treatment, it is important that you follow these instructions:  Avoid sexual activity or use condoms correctly.  Do not douche.  Avoid alcohol as directed by Sharon health care provider.  Avoid breastfeeding as directed by Sharon health care provider. SEEK MEDICAL CARE IF:   Sharon symptoms are not improving after 3 days of treatment.  You have increased discharge or pain.  You have a fever. MAKE SURE YOU:   Understand these instructions.  Will watch Sharon condition.  Will get help right away if you are not doing well or get worse. FOR MORE INFORMATION  Centers for Disease Control and Prevention, Division of STD Prevention: SolutionApps.co.zawww.cdc.gov/std American Sexual Health Association (ASHA): www.ashastd.org    This information is not intended to replace advice given to you by Sharon health care provider. Make sure you discuss any questions you have with Sharon health care provider.   Document Released: 02/28/2005 Document Revised: 03/21/2014 Document Reviewed: 10/10/2012 Elsevier Interactive Patient  Education ©2016 Elsevier Inc. ° °

## 2015-09-16 LAB — GC/CHLAMYDIA PROBE AMP (~~LOC~~) NOT AT ARMC
CHLAMYDIA, DNA PROBE: NEGATIVE
NEISSERIA GONORRHEA: NEGATIVE

## 2015-09-17 ENCOUNTER — Other Ambulatory Visit: Payer: BLUE CROSS/BLUE SHIELD

## 2015-09-17 ENCOUNTER — Other Ambulatory Visit: Payer: Self-pay | Admitting: Obstetrics & Gynecology

## 2015-09-17 DIAGNOSIS — O039 Complete or unspecified spontaneous abortion without complication: Secondary | ICD-10-CM

## 2015-09-17 MED ORDER — PROMETHAZINE HCL 25 MG PO TABS: 25.0000 mg | ORAL_TABLET | Freq: Four times a day (QID) | ORAL | Status: DC | PRN

## 2015-09-17 NOTE — Progress Notes (Unsigned)
Patient here from ER follow up today. Had Dr Debroah LoopArnold to review chart, bhcg not needed- patient needs ultrasound. Scheduled for 7/12 @ 9am and informed patient. Patient verbalized understanding & states she has been extremely nauseous. Obtained Rx for phenergan from Dr Debroah LoopArnold & informed patient. Patient verbalized understanding & had no questions

## 2015-09-23 ENCOUNTER — Encounter: Payer: Self-pay | Admitting: Advanced Practice Midwife

## 2015-09-23 ENCOUNTER — Ambulatory Visit (INDEPENDENT_AMBULATORY_CARE_PROVIDER_SITE_OTHER): Payer: Medicaid Other | Admitting: Advanced Practice Midwife

## 2015-09-23 ENCOUNTER — Ambulatory Visit (HOSPITAL_COMMUNITY)
Admission: RE | Admit: 2015-09-23 | Discharge: 2015-09-23 | Disposition: A | Discharge status: Home / Self Care | Payer: Medicaid Other | Source: Ambulatory Visit | Attending: Obstetrics & Gynecology | Admitting: Obstetrics & Gynecology

## 2015-09-23 DIAGNOSIS — R109 Unspecified abdominal pain: Secondary | ICD-10-CM | POA: Diagnosis not present

## 2015-09-23 DIAGNOSIS — O039 Complete or unspecified spontaneous abortion without complication: Secondary | ICD-10-CM

## 2015-09-23 DIAGNOSIS — O2620 Pregnancy care for patient with recurrent pregnancy loss, unspecified trimester: Secondary | ICD-10-CM | POA: Diagnosis not present

## 2015-09-23 DIAGNOSIS — O9989 Other specified diseases and conditions complicating pregnancy, childbirth and the puerperium: Secondary | ICD-10-CM | POA: Diagnosis not present

## 2015-09-23 DIAGNOSIS — O26899 Other specified pregnancy related conditions, unspecified trimester: Secondary | ICD-10-CM

## 2015-09-23 DIAGNOSIS — Z3A01 Less than 8 weeks gestation of pregnancy: Secondary | ICD-10-CM | POA: Insufficient documentation

## 2015-09-23 DIAGNOSIS — O3680X0 Pregnancy with inconclusive fetal viability, not applicable or unspecified: Secondary | ICD-10-CM | POA: Insufficient documentation

## 2015-09-23 NOTE — Patient Instructions (Signed)
First Trimester of Pregnancy The first trimester of pregnancy is from week 1 until the end of week 12 (months 1 through 3). A week after a sperm fertilizes an egg, the egg will implant on the wall of the uterus. This embryo will begin to develop into a baby. Genes from you and your partner are forming the baby. The female genes determine whether the baby is a boy or a girl. At 6-8 weeks, the eyes and face are formed, and the heartbeat can be seen on ultrasound. At the end of 12 weeks, all the baby's organs are formed.  Now that you are pregnant, you will want to do everything you can to have a healthy baby. Two of the most important things are to get good prenatal care and to follow your health care provider's instructions. Prenatal care is all the medical care you receive before the baby's birth. This care will help prevent, find, and treat any problems during the pregnancy and childbirth. BODY CHANGES Your body goes through many changes during pregnancy. The changes vary from woman to woman.   You may gain or lose a couple of pounds at first.  You may feel sick to your stomach (nauseous) and throw up (vomit). If the vomiting is uncontrollable, call your health care provider.  You may tire easily.  You may develop headaches that can be relieved by medicines approved by your health care provider.  You may urinate more often. Painful urination may mean you have a bladder infection.  You may develop heartburn as a result of your pregnancy.  You may develop constipation because certain hormones are causing the muscles that push waste through your intestines to slow down.  You may develop hemorrhoids or swollen, bulging veins (varicose veins).  Your breasts may begin to grow larger and become tender. Your nipples may stick out more, and the tissue that surrounds them (areola) may become darker.  Your gums may bleed and may be sensitive to brushing and flossing.  Dark spots or blotches (chloasma,  mask of pregnancy) may develop on your face. This will likely fade after the baby is born.  Your menstrual periods will stop.  You may have a loss of appetite.  You may develop cravings for certain kinds of food.  You may have changes in your emotions from day to day, such as being excited to be pregnant or being concerned that something may go wrong with the pregnancy and baby.  You may have more vivid and strange dreams.  You may have changes in your hair. These can include thickening of your hair, rapid growth, and changes in texture. Some women also have hair loss during or after pregnancy, or hair that feels dry or thin. Your hair will most likely return to normal after your baby is born. WHAT TO EXPECT AT YOUR PRENATAL VISITS During a routine prenatal visit:  You will be weighed to make sure you and the baby are growing normally.  Your blood pressure will be taken.  Your abdomen will be measured to track your baby's growth.  The fetal heartbeat will be listened to starting around week 10 or 12 of your pregnancy.  Test results from any previous visits will be discussed. Your health care provider may ask you:  How you are feeling.  If you are feeling the baby move.  If you have had any abnormal symptoms, such as leaking fluid, bleeding, severe headaches, or abdominal cramping.  If you are using any tobacco products,   including cigarettes, chewing tobacco, and electronic cigarettes.  If you have any questions. Other tests that may be performed during your first trimester include:  Blood tests to find your blood type and to check for the presence of any previous infections. They will also be used to check for low iron levels (anemia) and Rh antibodies. Later in the pregnancy, blood tests for diabetes will be done along with other tests if problems develop.  Urine tests to check for infections, diabetes, or protein in the urine.  An ultrasound to confirm the proper growth  and development of the baby.  An amniocentesis to check for possible genetic problems.  Fetal screens for spina bifida and Down syndrome.  You may need other tests to make sure you and the baby are doing well.  HIV (human immunodeficiency virus) testing. Routine prenatal testing includes screening for HIV, unless you choose not to have this test. HOME CARE INSTRUCTIONS  Medicines  Follow your health care provider's instructions regarding medicine use. Specific medicines may be either safe or unsafe to take during pregnancy.  Take your prenatal vitamins as directed.  If you develop constipation, try taking a stool softener if your health care provider approves. Diet  Eat regular, well-balanced meals. Choose a variety of foods, such as meat or vegetable-based protein, fish, milk and low-fat dairy products, vegetables, fruits, and whole grain breads and cereals. Your health care provider will help you determine the amount of weight gain that is right for you.  Avoid raw meat and uncooked cheese. These carry germs that can cause birth defects in the baby.  Eating four or five small meals rather than three large meals a day may help relieve nausea and vomiting. If you start to feel nauseous, eating a few soda crackers can be helpful. Drinking liquids between meals instead of during meals also seems to help nausea and vomiting.  If you develop constipation, eat more high-fiber foods, such as fresh vegetables or fruit and whole grains. Drink enough fluids to keep your urine clear or pale yellow. Activity and Exercise  Exercise only as directed by your health care provider. Exercising will help you:  Control your weight.  Stay in shape.  Be prepared for labor and delivery.  Experiencing pain or cramping in the lower abdomen or low back is a good sign that you should stop exercising. Check with your health care provider before continuing normal exercises.  Try to avoid standing for long  periods of time. Move your legs often if you must stand in one place for a long time.  Avoid heavy lifting.  Wear low-heeled shoes, and practice good posture.  You may continue to have sex unless your health care provider directs you otherwise. Relief of Pain or Discomfort  Wear a good support bra for breast tenderness.   Take warm sitz baths to soothe any pain or discomfort caused by hemorrhoids. Use hemorrhoid cream if your health care provider approves.   Rest with your legs elevated if you have leg cramps or low back pain.  If you develop varicose veins in your legs, wear support hose. Elevate your feet for 15 minutes, 3-4 times a day. Limit salt in your diet. Prenatal Care  Schedule your prenatal visits by the twelfth week of pregnancy. They are usually scheduled monthly at first, then more often in the last 2 months before delivery.  Write down your questions. Take them to your prenatal visits.  Keep all your prenatal visits as directed by your   health care provider. Safety  Wear your seat belt at all times when driving.  Make a list of emergency phone numbers, including numbers for family, friends, the hospital, and police and fire departments. General Tips  Ask your health care provider for a referral to a local prenatal education class. Begin classes no later than at the beginning of month 6 of your pregnancy.  Ask for help if you have counseling or nutritional needs during pregnancy. Your health care provider can offer advice or refer you to specialists for help with various needs.  Do not use hot tubs, steam rooms, or saunas.  Do not douche or use tampons or scented sanitary pads.  Do not cross your legs for long periods of time.  Avoid cat litter boxes and soil used by cats. These carry germs that can cause birth defects in the baby and possibly loss of the fetus by miscarriage or stillbirth.  Avoid all smoking, herbs, alcohol, and medicines not prescribed by  your health care provider. Chemicals in these affect the formation and growth of the baby.  Do not use any tobacco products, including cigarettes, chewing tobacco, and electronic cigarettes. If you need help quitting, ask your health care provider. You may receive counseling support and other resources to help you quit.  Schedule a dentist appointment. At home, brush your teeth with a soft toothbrush and be gentle when you floss. SEEK MEDICAL CARE IF:   You have dizziness.  You have mild pelvic cramps, pelvic pressure, or nagging pain in the abdominal area.  You have persistent nausea, vomiting, or diarrhea.  You have a bad smelling vaginal discharge.  You have pain with urination.  You notice increased swelling in your face, hands, legs, or ankles. SEEK IMMEDIATE MEDICAL CARE IF:   You have a fever.  You are leaking fluid from your vagina.  You have spotting or bleeding from your vagina.  You have severe abdominal cramping or pain.  You have rapid weight gain or loss.  You vomit blood or material that looks like coffee grounds.  You are exposed to German measles and have never had them.  You are exposed to fifth disease or chickenpox.  You develop a severe headache.  You have shortness of breath.  You have any kind of trauma, such as from a fall or a car accident.   This information is not intended to replace advice given to you by your health care provider. Make sure you discuss any questions you have with your health care provider.   Document Released: 02/22/2001 Document Revised: 03/21/2014 Document Reviewed: 01/08/2013 Elsevier Interactive Patient Education 2016 Elsevier Inc.  

## 2015-09-26 ENCOUNTER — Encounter: Payer: Self-pay | Admitting: Advanced Practice Midwife

## 2015-09-26 DIAGNOSIS — O26899 Other specified pregnancy related conditions, unspecified trimester: Secondary | ICD-10-CM | POA: Insufficient documentation

## 2015-09-26 DIAGNOSIS — R109 Unspecified abdominal pain: Principal | ICD-10-CM

## 2015-09-26 HISTORY — DX: Other specified pregnancy related conditions, unspecified trimester: O26.899

## 2015-09-26 HISTORY — DX: Unspecified abdominal pain: R10.9

## 2015-09-26 NOTE — Progress Notes (Signed)
Ultrasounds Results Note  SUBJECTIVE HPI:  [name] is a 20 y.o. No obstetric history on file. at Unknown by LMP who presents to the [facility] for followup ultrasound results. The patient denies abdominal pain or vaginal bleeding.  Upon review of the patient's records, patient was first seen in ER on 09/15/15 for abdominal pain.   BHCG on that day was  96045 (H).  Ultrasound not done.  Repeat ultrasound was performed earlier today.   Past Medical History  Diagnosis Date  . Asthma   . UTI (lower urinary tract infection)   . Bronchitis   . Bacterial vaginosis    History reviewed. No pertinent past surgical history. Social History   Social History  . Marital Status: Single    Spouse Name: N/A  . Number of Children: N/A  . Years of Education: N/A   Occupational History  . Not on file.   Social History Main Topics  . Smoking status: Former Smoker    Types: Cigarettes  . Smokeless tobacco: Never Used  . Alcohol Use: No  . Drug Use: No  . Sexual Activity: Yes    Birth Control/ Protection: None   Other Topics Concern  . Not on file   Social History Narrative   Current Outpatient Prescriptions on File Prior to Visit  Medication Sig Dispense Refill  . diphenhydrAMINE (SOMINEX) 25 MG tablet Take 25 mg by mouth at bedtime as needed for itching or sleep.    Marland Kitchen ibuprofen (ADVIL,MOTRIN) 200 MG tablet Take 400-600 mg by mouth 2 (two) times daily as needed for moderate pain.     . metroNIDAZOLE (FLAGYL) 500 MG tablet Take 1 tablet (500 mg total) by mouth 3 (three) times daily. 21 tablet 0  . Prenatal Vit-Fe Fumarate-FA (PRENATAL COMPLETE) 14-0.4 MG TABS Take 1 tablet by mouth daily. 60 each 0  . promethazine (PHENERGAN) 25 MG tablet Take 1 tablet (25 mg total) by mouth every 6 (six) hours as needed for nausea or vomiting. 30 tablet 0  . [DISCONTINUED] dicyclomine (BENTYL) 20 MG tablet Take 1 tablet (20 mg total) by mouth 2 (two) times daily as needed for spasms.  (Patient not taking: Reported on 01/29/2014) 20 tablet 0   No current facility-administered medications on file prior to visit.   Allergies  Allergen Reactions  . Augmentin [Amoxicillin-Pot Clavulanate] Rash    I have reviewed patient's Past Medical Hx, Surgical Hx, Family Hx, Social Hx, medications and allergies.   Review of Systems Review of Systems  Constitutional: Negative for fever and chills.  Gastrointestinal: Negative for nausea, vomiting, abdominal pain, diarrhea and constipation.  Genitourinary: Negative for dysuria.  Musculoskeletal: Negative for back pain.  Neurological: Negative for dizziness and weakness.    Physical Exam  LMP 09/08/2015  GENERAL: Well-developed, well-nourished female in no acute distress.  HEENT: Normocephalic, atraumatic.   LUNGS: Effort normal ABDOMEN: soft, non-tender HEART: Regular rate  SKIN: Warm, dry and without erythema PSYCH: Normal mood and affect NEURO: Alert and oriented x 4  LAB RESULTS No results found for this or any previous visit (from the past 24 hour(s)).  IMAGING US Ob Comp Less 14 Wks  09/15/2015  CLINICAL DATA:  Pregnant patient in first-trimester pregnancy with lower abdominal pain and nausea for 1 week. EXAM: OBSTETRIC <14 WK [place] AND TRANSVAGINAL OB US TECHNIQUE: Both transabdominal and transvaginal ultrasound examinations were performed for complete evaluation of the gestation as well as the maternal uterus, adnexal regions, and pelvic cul-de-sac. Transvaginal technique  was performed to assess early pregnancy. COMPARISON:  None. FINDINGS: Intrauterine gestational sac: Present. Yolk sac:  Not visualized. Embryo:  Present. Cardiac Activity: Likely present, with fluttering visualized on cine clips. Heart Rate: Unable to measure heart rate. CRL:  2.6  mm   5 w   6 d                  US EDC: 05/11/2016 Subchorionic hemorrhage:  None visualized. Maternal uterus/adnexae: There is a probable corpus luteal cyst in the right ovary  measuring 2.4 cm. The left ovary is normal. There is no pelvic free fluid. IMPRESSION: 1. Intrauterine gestational sac and fetal pole. Cardiac activity appeared present, however a discrete heart rate could not be measured. Additionally, the yolk sac is not seen. Recommend trending of beta HCG and sonographic follow-up in 7-10 days to assess fetal viability. 2. No subchorionic hemorrhage. 3. Probable corpus luteal cyst in the right ovary. Electronically Signed   By: Rubye OaksMelanie  Ehinger M.D.   On: 09/15/2015 23:20   [place] Ob Transvaginal  09/23/2015  CLINICAL DATA:  Assess viability of early pregnancy ; history of spontaneous abortion ; beta HCG of greater than 25,000 on September 15, 2015 EXAM: TRANSVAGINAL OB ULTRASOUND TECHNIQUE: Transvaginal ultrasound was performed for complete evaluation of the gestation as well as the maternal uterus, adnexal regions, and pelvic cul-de-sac. COMPARISON:  Ob ultrasound of September 15, 2015 which revealed an IUP without yolk sac or cardiac activity. FINDINGS: Intrauterine gestational sac: Single, present Yolk sac:  Present Embryo:  Present Cardiac Activity: Present Heart Rate: 121 bpm CRL:   7  mm   6 w 4 d                  US EDC: May 14, 2016 Subchorionic hemorrhage:  None visualized Maternal uterus/adnexae: The maternal ovaries are normal in size and echotexture. IMPRESSION: Viable early IUP with estimated gestational age of [redacted] weeks 4 days with estimated date of confinement of May 14, 2016. There is no subchorionic hemorrhage. This ultrasound dating is in reasonable agreement with menstrual dating. Electronically Signed   By: David  SwazilandJordan M.D.   On: 09/23/2015 09:44   [place] Ob Transvaginal  09/15/2015  CLINICAL DATA:  Pregnant patient in first-trimester pregnancy with lower abdominal pain and nausea for 1 week. EXAM: OBSTETRIC <14 WK US AND TRANSVAGINAL OB US TECHNIQUE: Both transabdominal and transvaginal ultrasound examinations were performed for complete evaluation of the gestation as  well as the maternal uterus, adnexal regions, and pelvic cul-de-sac. Transvaginal technique was performed to assess early pregnancy. COMPARISON:  None. FINDINGS: Intrauterine gestational sac: Present. Yolk sac:  Not visualized. Embryo:  Present. Cardiac Activity: Likely present, with fluttering visualized on cine clips. Heart Rate: Unable to measure heart rate. CRL:  2.6  mm   5 w   6 d                  US EDC: 05/11/2016 Subchorionic hemorrhage:  None visualized. Maternal uterus/adnexae: There is a probable corpus luteal cyst in the right ovary measuring 2.4 cm. The left ovary is normal. There is no pelvic free fluid. IMPRESSION: 1. Intrauterine gestational sac and fetal pole. Cardiac activity appeared present, however a discrete heart rate could not be measured. Additionally, the yolk sac is not seen. Recommend trending of beta HCG and sonographic follow-up in 7-10 days to assess fetal viability. 2. No subchorionic hemorrhage. 3. Probable corpus luteal cyst in the right ovary. Electronically Signed   By:  Rubye Oaks M.D.   On: 09/15/2015 23:20    ASSESSMENT 1. Abdominal pain affecting pregnancy, antepartum   2.   Single IUP at [redacted]w[redacted]d  PLAN Discharge home in stable condition Patient advised to start/continue taking prenatal vitamins Follow up US with provider Pregnancy confirmation letter given Patient advised to start prenatal care with [facility] provider of choice as soon as possible  Aviva Signs, CNM  09/26/2015  6:39 AM

## 2015-10-15 ENCOUNTER — Inpatient Hospital Stay (HOSPITAL_COMMUNITY)
Admission: AD | Admit: 2015-10-15 | Discharge: 2015-10-15 | Disposition: A | Discharge status: Home / Self Care | Payer: Medicaid Other | Source: Ambulatory Visit | Attending: Obstetrics & Gynecology | Admitting: Obstetrics & Gynecology

## 2015-10-15 ENCOUNTER — Encounter (HOSPITAL_COMMUNITY): Payer: Self-pay | Admitting: *Deleted

## 2015-10-15 DIAGNOSIS — Z36 Encounter for antenatal screening of mother: Secondary | ICD-10-CM | POA: Diagnosis not present

## 2015-10-15 DIAGNOSIS — R109 Unspecified abdominal pain: Secondary | ICD-10-CM | POA: Diagnosis not present

## 2015-10-15 DIAGNOSIS — Z880 Allergy status to penicillin: Secondary | ICD-10-CM | POA: Insufficient documentation

## 2015-10-15 DIAGNOSIS — O26899 Other specified pregnancy related conditions, unspecified trimester: Secondary | ICD-10-CM

## 2015-10-15 DIAGNOSIS — O99511 Diseases of the respiratory system complicating pregnancy, first trimester: Secondary | ICD-10-CM | POA: Insufficient documentation

## 2015-10-15 DIAGNOSIS — O9989 Other specified diseases and conditions complicating pregnancy, childbirth and the puerperium: Secondary | ICD-10-CM | POA: Diagnosis not present

## 2015-10-15 DIAGNOSIS — Z87891 Personal history of nicotine dependence: Secondary | ICD-10-CM | POA: Diagnosis not present

## 2015-10-15 DIAGNOSIS — Z3A10 10 weeks gestation of pregnancy: Secondary | ICD-10-CM | POA: Diagnosis not present

## 2015-10-15 DIAGNOSIS — J45909 Unspecified asthma, uncomplicated: Secondary | ICD-10-CM | POA: Insufficient documentation

## 2015-10-15 DIAGNOSIS — O26891 Other specified pregnancy related conditions, first trimester: Secondary | ICD-10-CM | POA: Diagnosis present

## 2015-10-15 LAB — URINALYSIS, ROUTINE W REFLEX MICROSCOPIC
BILIRUBIN URINE: NEGATIVE
Glucose, UA: NEGATIVE mg/dL
HGB URINE DIPSTICK: NEGATIVE
KETONES UR: NEGATIVE mg/dL
Leukocytes, UA: NEGATIVE
Nitrite: NEGATIVE
PROTEIN: NEGATIVE mg/dL
Specific Gravity, Urine: 1.025 (ref 1.005–1.030)
pH: 6 (ref 5.0–8.0)

## 2015-10-15 NOTE — MAU Note (Signed)
Pt states she has been have abd cramping x 1 weeks.  Having vaginal discharge, whitish.  Pt had BV with last visit but didn't take the Rx prescribed.  Denies vaginal bleeding.

## 2015-10-15 NOTE — MAU Provider Note (Signed)
History     CSN: [id number]  Arrival date and time: 10/15/15 1049   First Provider Initiated Contact with Patient 10/15/15 1113      Chief Complaint  Patient presents with  . Abdominal Cramping  . Vaginal Discharge   20 y.o. G1P0 at [redacted]w[redacted]d by 6 week scan not c/w LMP who presented with abdominal cramping for 2 days. Already had recent scan showing viable IUP. No bleeding, no fevers, no dysuria or increased urinary frequency. No nausea or vomiting.     OB History    Gravida Para Term Preterm AB Living   1             SAB TAB Ectopic Multiple Live Births           0      Past Medical History:  Diagnosis Date  . Asthma   . Bacterial vaginosis   . Bronchitis   . UTI (lower urinary tract infection)     History reviewed. No pertinent surgical history.  History reviewed. No pertinent family history.  Social History  Substance Use Topics  . Smoking status: Former Smoker    Types: Cigarettes  . Smokeless tobacco: Never Used  . Alcohol use No    Allergies:  Allergies  Allergen Reactions  . Augmentin [Amoxicillin-Pot Clavulanate] Rash    Prescriptions Prior to Admission  Medication Sig Dispense Refill Last Dose  . diphenhydrAMINE (SOMINEX) 25 MG tablet Take 25 mg by mouth at bedtime as needed for itching or sleep.   Past Month at Unknown time  . ibuprofen (ADVIL,MOTRIN) 200 MG tablet Take 400-600 mg by mouth 2 (two) times daily as needed for moderate pain.    2 weeks  . metroNIDAZOLE (FLAGYL) 500 MG tablet Take 1 tablet (500 mg total) by mouth 3 (three) times daily. 21 tablet 0   . Prenatal Vit-Fe Fumarate-FA (PRENATAL COMPLETE) 14-0.4 MG TABS Take 1 tablet by mouth daily. 60 each 0   . promethazine (PHENERGAN) 25 MG tablet Take 1 tablet (25 mg total) by mouth every 6 (six) hours as needed for nausea or vomiting. 30 tablet 0     Review of Systems  All other systems reviewed and are negative.  Physical Exam   Blood pressure 106/61, pulse 70, temperature 98.1 F  (36.7 C), temperature source Oral, resp. rate 16, height  (1.702 m), weight 109 lb 6.4 oz (49.6 kg), last menstrual period 09/08/2015, SpO2 100 %.  Physical Exam  Constitutional: She is oriented to person, place, and time. She appears well-developed and well-nourished.  Eyes: EOM are normal. Pupils are equal, round, and reactive to light.  Neck: Normal range of motion. Neck supple.  Cardiovascular: Normal rate.   Respiratory: Effort normal and breath sounds normal.  GI: Soft. Bowel sounds are normal. She exhibits no distension. There is no tenderness.  Genitourinary:  Genitourinary Comments: Deferred  Musculoskeletal: Normal range of motion.  Neurological: She is alert and oriented to person, place, and time. She has normal reflexes.  Skin: Skin is warm and dry.  Psychiatric: She has a normal mood and affect. Her behavior is normal. Thought content normal.    MAU Course  Procedures  MDM Bedside scan revealed viable IUP with FHR in 150s.  Results for orders placed or performed during the hospital encounter of 10/15/15 (from the past 24 hour(s))  Urinalysis, Routine w reflex microscopic (not at [facility])     Status: None   Collection Time: 10/15/15 11:00 AM  Result Value Ref Range  Color, Urine YELLOW YELLOW   APPearance CLEAR CLEAR   Specific Gravity, Urine 1.025 1.005 - 1.030   pH 6.0 5.0 - 8.0   Glucose, UA NEGATIVE NEGATIVE mg/dL   Hgb urine dipstick NEGATIVE NEGATIVE   Bilirubin Urine NEGATIVE NEGATIVE   Ketones, ur NEGATIVE NEGATIVE mg/dL   Protein, ur NEGATIVE NEGATIVE mg/dL   Nitrite NEGATIVE NEGATIVE   Leukocytes, UA NEGATIVE NEGATIVE     Assessment and Plan  Cramping in first trimester Patient reassured by normal limited scan Continue prenatal vitamins Routine obstetric precautions reviewed Will follow up as scheduled on 11/12/15 for initial OB outpatient appointment.  Jaryiah Mehlman A, MD 10/15/2015, 11:25 AM

## 2015-10-15 NOTE — Discharge Instructions (Signed)
First Trimester of Pregnancy The first trimester of pregnancy is from week 1 until the end of week 12 (months 1 through 3). A week after a sperm fertilizes an egg, the egg will implant on the wall of the uterus. This embryo will begin to develop into a baby. Genes from you and your partner are forming the baby. The female genes determine whether the baby is a boy or a girl. At 6-8 weeks, the eyes and face are formed, and the heartbeat can be seen on ultrasound. At the end of 12 weeks, all the baby's organs are formed.  Now that you are pregnant, you will want to do everything you can to have a healthy baby. Two of the most important things are to get good prenatal care and to follow your health care provider's instructions. Prenatal care is all the medical care you receive before the baby's birth. This care will help prevent, find, and treat any problems during the pregnancy and childbirth. BODY CHANGES Your body goes through many changes during pregnancy. The changes vary from woman to woman.   You may gain or lose a couple of pounds at first.  You may feel sick to your stomach (nauseous) and throw up (vomit). If the vomiting is uncontrollable, call your health care provider.  You may tire easily.  You may develop headaches that can be relieved by medicines approved by your health care provider.  You may urinate more often. Painful urination may mean you have a bladder infection.  You may develop heartburn as a result of your pregnancy.  You may develop constipation because certain hormones are causing the muscles that push waste through your intestines to slow down.  You may develop hemorrhoids or swollen, bulging veins (varicose veins).  Your breasts may begin to grow larger and become tender. Your nipples may stick out more, and the tissue that surrounds them (areola) may become darker.  Your gums may bleed and may be sensitive to brushing and flossing.  Dark spots or blotches (chloasma,  mask of pregnancy) may develop on your face. This will likely fade after the baby is born.  Your menstrual periods will stop.  You may have a loss of appetite.  You may develop cravings for certain kinds of food.  You may have changes in your emotions from day to day, such as being excited to be pregnant or being concerned that something may go wrong with the pregnancy and baby.  You may have more vivid and strange dreams.  You may have changes in your hair. These can include thickening of your hair, rapid growth, and changes in texture. Some women also have hair loss during or after pregnancy, or hair that feels dry or thin. Your hair will most likely return to normal after your baby is born. WHAT TO EXPECT AT YOUR PRENATAL VISITS During a routine prenatal visit:  You will be weighed to make sure you and the baby are growing normally.  Your blood pressure will be taken.  Your abdomen will be measured to track your baby's growth.  The fetal heartbeat will be listened to starting around week 10 or 12 of your pregnancy.  Test results from any previous visits will be discussed. Your health care provider may ask you:  How you are feeling.  If you are feeling the baby move.  If you have had any abnormal symptoms, such as leaking fluid, bleeding, severe headaches, or abdominal cramping.  If you are using any tobacco products,   including cigarettes, chewing tobacco, and electronic cigarettes.  If you have any questions. Other tests that may be performed during your first trimester include:  Blood tests to find your blood type and to check for the presence of any previous infections. They will also be used to check for low iron levels (anemia) and Rh antibodies. Later in the pregnancy, blood tests for diabetes will be done along with other tests if problems develop.  Urine tests to check for infections, diabetes, or protein in the urine.  An ultrasound to confirm the proper growth  and development of the baby.  An amniocentesis to check for possible genetic problems.  Fetal screens for spina bifida and Down syndrome.  You may need other tests to make sure you and the baby are doing well.  HIV (human immunodeficiency virus) testing. Routine prenatal testing includes screening for HIV, unless you choose not to have this test. HOME CARE INSTRUCTIONS  Medicines  Follow your health care provider's instructions regarding medicine use. Specific medicines may be either safe or unsafe to take during pregnancy.  Take your prenatal vitamins as directed.  If you develop constipation, try taking a stool softener if your health care provider approves. Diet  Eat regular, well-balanced meals. Choose a variety of foods, such as meat or vegetable-based protein, fish, milk and low-fat dairy products, vegetables, fruits, and whole grain breads and cereals. Your health care provider will help you determine the amount of weight gain that is right for you.  Avoid raw meat and uncooked cheese. These carry germs that can cause birth defects in the baby.  Eating four or five small meals rather than three large meals a day may help relieve nausea and vomiting. If you start to feel nauseous, eating a few soda crackers can be helpful. Drinking liquids between meals instead of during meals also seems to help nausea and vomiting.  If you develop constipation, eat more high-fiber foods, such as fresh vegetables or fruit and whole grains. Drink enough fluids to keep your urine clear or pale yellow. Activity and Exercise  Exercise only as directed by your health care provider. Exercising will help you:  Control your weight.  Stay in shape.  Be prepared for labor and delivery.  Experiencing pain or cramping in the lower abdomen or low back is a good sign that you should stop exercising. Check with your health care provider before continuing normal exercises.  Try to avoid standing for long  periods of time. Move your legs often if you must stand in one place for a long time.  Avoid heavy lifting.  Wear low-heeled shoes, and practice good posture.  You may continue to have sex unless your health care provider directs you otherwise. Relief of Pain or Discomfort  Wear a good support bra for breast tenderness.   Take warm sitz baths to soothe any pain or discomfort caused by hemorrhoids. Use hemorrhoid cream if your health care provider approves.   Rest with your legs elevated if you have leg cramps or low back pain.  If you develop varicose veins in your legs, wear support hose. Elevate your feet for 15 minutes, 3-4 times a day. Limit salt in your diet. Prenatal Care  Schedule your prenatal visits by the twelfth week of pregnancy. They are usually scheduled monthly at first, then more often in the last 2 months before delivery.  Write down your questions. Take them to your prenatal visits.  Keep all your prenatal visits as directed by your   health care provider. Safety  Wear your seat belt at all times when driving.  Make a list of emergency phone numbers, including numbers for family, friends, the hospital, and police and fire departments. General Tips  Ask your health care provider for a referral to a local prenatal education class. Begin classes no later than at the beginning of month 6 of your pregnancy.  Ask for help if you have counseling or nutritional needs during pregnancy. Your health care provider can offer advice or refer you to specialists for help with various needs.  Do not use hot tubs, steam rooms, or saunas.  Do not douche or use tampons or scented sanitary pads.  Do not cross your legs for long periods of time.  Avoid cat litter boxes and soil used by cats. These carry germs that can cause birth defects in the baby and possibly loss of the fetus by miscarriage or stillbirth.  Avoid all smoking, herbs, alcohol, and medicines not prescribed by  your health care provider. Chemicals in these affect the formation and growth of the baby.  Do not use any tobacco products, including cigarettes, chewing tobacco, and electronic cigarettes. If you need help quitting, ask your health care provider. You may receive counseling support and other resources to help you quit.  Schedule a dentist appointment. At home, brush your teeth with a soft toothbrush and be gentle when you floss. SEEK MEDICAL CARE IF:   You have dizziness.  You have mild pelvic cramps, pelvic pressure, or nagging pain in the abdominal area.  You have persistent nausea, vomiting, or diarrhea.  You have a bad smelling vaginal discharge.  You have pain with urination.  You notice increased swelling in your face, hands, legs, or ankles. SEEK IMMEDIATE MEDICAL CARE IF:   You have a fever.  You are leaking fluid from your vagina.  You have spotting or bleeding from your vagina.  You have severe abdominal cramping or pain.  You have rapid weight gain or loss.  You vomit blood or material that looks like coffee grounds.  You are exposed to German measles and have never had them.  You are exposed to fifth disease or chickenpox.  You develop a severe headache.  You have shortness of breath.  You have any kind of trauma, such as from a fall or a car accident.   This information is not intended to replace advice given to you by your health care provider. Make sure you discuss any questions you have with your health care provider.   Document Released: 02/22/2001 Document Revised: 03/21/2014 Document Reviewed: 01/08/2013 [employer] Interactive Patient Education 2016 [employer]  

## 2015-10-27 ENCOUNTER — Inpatient Hospital Stay (HOSPITAL_COMMUNITY)
Admission: AD | Admit: 2015-10-27 | Discharge: 2015-10-27 | Disposition: A | Discharge status: Home / Self Care | Payer: Medicaid Other | Source: Ambulatory Visit | Attending: Family Medicine | Admitting: Family Medicine

## 2015-10-27 ENCOUNTER — Encounter (HOSPITAL_COMMUNITY): Payer: Self-pay | Admitting: *Deleted

## 2015-10-27 DIAGNOSIS — Z3A11 11 weeks gestation of pregnancy: Secondary | ICD-10-CM | POA: Insufficient documentation

## 2015-10-27 DIAGNOSIS — O9989 Other specified diseases and conditions complicating pregnancy, childbirth and the puerperium: Secondary | ICD-10-CM

## 2015-10-27 DIAGNOSIS — K5900 Constipation, unspecified: Secondary | ICD-10-CM | POA: Diagnosis not present

## 2015-10-27 DIAGNOSIS — R1031 Right lower quadrant pain: Secondary | ICD-10-CM | POA: Diagnosis not present

## 2015-10-27 DIAGNOSIS — O26899 Other specified pregnancy related conditions, unspecified trimester: Secondary | ICD-10-CM

## 2015-10-27 DIAGNOSIS — R109 Unspecified abdominal pain: Secondary | ICD-10-CM

## 2015-10-27 DIAGNOSIS — Z87891 Personal history of nicotine dependence: Secondary | ICD-10-CM | POA: Insufficient documentation

## 2015-10-27 DIAGNOSIS — O26891 Other specified pregnancy related conditions, first trimester: Secondary | ICD-10-CM | POA: Diagnosis not present

## 2015-10-27 DIAGNOSIS — E860 Dehydration: Secondary | ICD-10-CM

## 2015-10-27 LAB — URINALYSIS, ROUTINE W REFLEX MICROSCOPIC
BILIRUBIN URINE: NEGATIVE
Glucose, UA: NEGATIVE mg/dL
HGB URINE DIPSTICK: NEGATIVE
Ketones, ur: NEGATIVE mg/dL
Leukocytes, UA: NEGATIVE
Nitrite: NEGATIVE
Protein, ur: NEGATIVE mg/dL
Specific Gravity, Urine: >1.03 — ABNORMAL HIGH (ref 1.005–1.030)
pH: 6 (ref 5.0–8.0)

## 2015-10-27 NOTE — MAU Note (Signed)
Patient informed that she is not drinking enough fluids. Patient states she is still having some N/V. Encouraged to take small sips and bites throughout the day. Given gingerale.

## 2015-10-27 NOTE — Discharge Instructions (Signed)
First Trimester of Pregnancy  The first trimester of pregnancy is from week 1 until the end of week 12 (months 1 through 3). A week after a sperm fertilizes an egg, the egg will implant on the wall of the uterus. This embryo will begin to develop into a baby. Genes from you and your partner are forming the baby. The female genes determine whether the baby is a boy or a girl. At 6-8 weeks, the eyes and face are formed, and the heartbeat can be seen on ultrasound. At the end of 12 weeks, all the baby's organs are formed.   Now that you are pregnant, you will want to do everything you can to have a healthy baby. Two of the most important things are to get good prenatal care and to follow your health care provider's instructions. Prenatal care is all the medical care you receive before the baby's birth. This care will help prevent, find, and treat any problems during the pregnancy and childbirth.  BODY CHANGES  Your body goes through many changes during pregnancy. The changes vary from woman to woman.   · You may gain or lose a couple of pounds at first.  · You may feel sick to your stomach (nauseous) and throw up (vomit). If the vomiting is uncontrollable, call your health care provider.  · You may tire easily.  · You may develop headaches that can be relieved by medicines approved by your health care provider.  · You may urinate more often. Painful urination may mean you have a bladder infection.  · You may develop heartburn as a result of your pregnancy.  · You may develop constipation because certain hormones are causing the muscles that push waste through your intestines to slow down.  · You may develop hemorrhoids or swollen, bulging veins (varicose veins).  · Your breasts may begin to grow larger and become tender. Your nipples may stick out more, and the tissue that surrounds them (areola) may become darker.  · Your gums may bleed and may be sensitive to brushing and flossing.   · Dark spots or blotches (chloasma, mask of pregnancy) may develop on your face. This will likely fade after the baby is born.  · Your menstrual periods will stop.  · You may have a loss of appetite.  · You may develop cravings for certain kinds of food.  · You may have changes in your emotions from day to day, such as being excited to be pregnant or being concerned that something may go wrong with the pregnancy and baby.  · You may have more vivid and strange dreams.  · You may have changes in your hair. These can include thickening of your hair, rapid growth, and changes in texture. Some women also have hair loss during or after pregnancy, or hair that feels dry or thin. Your hair will most likely return to normal after your baby is born.  WHAT TO EXPECT AT YOUR PRENATAL VISITS  During a routine prenatal visit:  · You will be weighed to make sure you and the baby are growing normally.  · Your blood pressure will be taken.  · Your abdomen will be measured to track your baby's growth.  · The fetal heartbeat will be listened to starting around week 10 or 12 of your pregnancy.  · Test results from any previous visits will be discussed.  Your health care provider may ask you:  · How you are feeling.  · If you   including cigarettes, chewing tobacco, and electronic cigarettes. °· If you have any questions. °Other tests that may be performed during your first trimester include: °· Blood tests to find your blood type and to check for the presence of any previous infections. They will also be used to check for low iron levels (anemia) and Rh antibodies. Later in the pregnancy, blood tests for diabetes will be done along with other tests if problems develop. °· Urine tests to check for infections, diabetes, or protein in the urine. °· An ultrasound to confirm the proper growth  and development of the baby. °· An amniocentesis to check for possible genetic problems. °· Fetal screens for spina bifida and Down syndrome. °· You may need other tests to make sure you and the baby are doing well. °· HIV (human immunodeficiency virus) testing. Routine prenatal testing includes screening for HIV, unless you choose not to have this test. °HOME CARE INSTRUCTIONS  °Medicines °· Follow your health care provider's instructions regarding medicine use. Specific medicines may be either safe or unsafe to take during pregnancy. °· Take your prenatal vitamins as directed. °· If you develop constipation, try taking a stool softener if your health care provider approves. °Diet °· Eat regular, well-balanced meals. Choose a variety of foods, such as meat or vegetable-based protein, fish, milk and low-fat dairy products, vegetables, fruits, and whole grain breads and cereals. Your health care provider will help you determine the amount of weight gain that is right for you. °· Avoid raw meat and uncooked cheese. These carry germs that can cause birth defects in the baby. °· Eating four or five small meals rather than three large meals a day may help relieve nausea and vomiting. If you start to feel nauseous, eating a few soda crackers can be helpful. Drinking liquids between meals instead of during meals also seems to help nausea and vomiting. °· If you develop constipation, eat more high-fiber foods, such as fresh vegetables or fruit and whole grains. Drink enough fluids to keep your urine clear or pale yellow. °Activity and Exercise °· Exercise only as directed by your health care provider. Exercising will help you: °¨ Control your weight. °¨ Stay in shape. °¨ Be prepared for labor and delivery. °· Experiencing pain or cramping in the lower abdomen or low back is a good sign that you should stop exercising. Check with your health care provider before continuing normal exercises. °· Try to avoid standing for long  periods of time. Move your legs often if you must stand in one place for a long time. °· Avoid heavy lifting. °· Wear low-heeled shoes, and practice good posture. °· You may continue to have sex unless your health care provider directs you otherwise. °Relief of Pain or Discomfort °· Wear a good support bra for breast tenderness.   °· Take warm sitz baths to soothe any pain or discomfort caused by hemorrhoids. Use hemorrhoid cream if your health care provider approves.   °· Rest with your legs elevated if you have leg cramps or low back pain. °· If you develop varicose veins in your legs, wear support hose. Elevate your feet for 15 minutes, 3-4 times a day. Limit salt in your diet. °Prenatal Care °· Schedule your prenatal visits by the twelfth week of pregnancy. They are usually scheduled monthly at first, then more often in the last 2 months before delivery. °· Write down your questions. Take them to your prenatal visits. °· Keep all your prenatal visits as directed by your   health care provider. Safety  Wear your seat belt at all times when driving.  Make a list of emergency phone numbers, including numbers for family, friends, the hospital, and police and fire departments. General Tips  Ask your health care provider for a referral to a local prenatal education class. Begin classes no later than at the beginning of month 6 of your pregnancy.  Ask for help if you have counseling or nutritional needs during pregnancy. Your health care provider can offer advice or refer you to specialists for help with various needs.  Do not use hot tubs, steam rooms, or saunas.  Do not douche or use tampons or scented sanitary pads.  Do not cross your legs for long periods of time.  Avoid cat litter boxes and soil used by cats. These carry germs that can cause birth defects in the baby and possibly loss of the fetus by miscarriage or stillbirth.  Avoid all smoking, herbs, alcohol, and medicines not prescribed by  your health care provider. Chemicals in these affect the formation and growth of the baby.  Do not use any tobacco products, including cigarettes, chewing tobacco, and electronic cigarettes. If you need help quitting, ask your health care provider. You may receive counseling support and other resources to help you quit.  Schedule a dentist appointment. At home, brush your teeth with a soft toothbrush and be gentle when you floss. SEEK MEDICAL CARE IF:   You have dizziness.  You have mild pelvic cramps, pelvic pressure, or nagging pain in the abdominal area.  You have persistent nausea, vomiting, or diarrhea.  You have a bad smelling vaginal discharge.  You have pain with urination.  You notice increased swelling in your face, hands, legs, or ankles. SEEK IMMEDIATE MEDICAL CARE IF:   You have a fever.  You are leaking fluid from your vagina.  You have spotting or bleeding from your vagina.  You have severe abdominal cramping or pain.  You have rapid weight gain or loss.  You vomit blood or material that looks like coffee grounds.  You are exposed to [place] measles and have never had them.  You are exposed to fifth disease or chickenpox.  You develop a severe headache.  You have shortness of breath.  You have any kind of trauma, such as from a fall or a car accident.   This information is not intended to replace advice given to you by your health care provider. Make sure you discuss any questions you have with your health care provider.   Document Released: 02/22/2001 Document Revised: 03/21/2014 Document Reviewed: 01/08/2013 Elsevier Interactive Patient Education 2016 [employer] Inc[employer] Dehydration, Adult Dehydration is a condition in which you do not have enough fluid or water in your body. It happens when you take in less fluid than you lose. Vital organs such as the kidneys, brain, and heart cannot function without a proper amount of fluids. Any loss of fluids from the  body can cause dehydration.  Dehydration can range from mild to severe. This condition should be treated right away to help prevent it from becoming severe. CAUSES  This condition may be caused by:  Vomiting.  Diarrhea.  Excessive sweating, such as when exercising in hot or humid weather.  Not drinking enough fluid during strenuous exercise or during an illness.  Excessive urine output.  Fever.  Certain medicines. RISK FACTORS This condition is more likely to develop in:  People who are taking certain medicines that cause the body to lose  excess fluid (diuretics).   People who have a chronic illness, such as diabetes, that may increase urination.  Older adults.   People who live at high altitudes.   People who participate in endurance sports.  SYMPTOMS  Mild Dehydration  Thirst.  Dry lips.  Slightly dry mouth.  Dry, warm skin. Moderate Dehydration  Very dry mouth.   Muscle cramps.   Dark urine and decreased urine production.   Decreased tear production.   Headache.   Light-headedness, especially when you stand up from a sitting position.  Severe Dehydration  Changes in skin.   Cold and clammy skin.   Skin does not spring back quickly when lightly pinched and released.   Changes in body fluids.   Extreme thirst.   No tears.   Not able to sweat when body temperature is high, such as in hot weather.   Minimal urine production.   Changes in vital signs.   Rapid, weak pulse (more than 100 beats per minute when you are sitting still).   Rapid breathing.   Low blood pressure.   Other changes.   Sunken eyes.   Cold hands and feet.   Confusion.  Lethargy and difficulty being awakened.  Fainting (syncope).   Short-term weight loss.   Unconsciousness. DIAGNOSIS  This condition may be diagnosed based on your symptoms. You may also have tests to determine how severe your dehydration is. These tests may  include:   Urine tests.   Blood tests.  TREATMENT  Treatment for this condition depends on the severity. Mild or moderate dehydration can often be treated at home. Treatment should be started right away. Do not wait until dehydration becomes severe. Severe dehydration needs to be treated at the hospital. Treatment for Mild Dehydration  Drinking plenty of water to replace the fluid you have lost.   Replacing minerals in your blood (electrolytes) that you may have lost.  Treatment for Moderate Dehydration  Consuming oral rehydration solution (ORS). Treatment for Severe Dehydration  Receiving fluid through an IV tube.   Receiving electrolyte solution through a feeding tube that is passed through your nose and into your stomach (nasogastric tube or NG tube).  Correcting any abnormalities in electrolytes. HOME CARE INSTRUCTIONS   Drink enough fluid to keep your urine clear or pale yellow.   Drink water or fluid slowly by taking small sips. You can also try sucking on ice cubes.  Have food or beverages that contain electrolytes. Examples include bananas and sports drinks.  Take over-the-counter and prescription medicines only as told by your health care provider.   Prepare ORS according to the manufacturer's instructions. Take sips of ORS every 5 minutes until your urine returns to normal.  If you have vomiting or diarrhea, continue to try to drink water, ORS, or both.   If you have diarrhea, avoid:   Beverages that contain caffeine.   Fruit juice.   Milk.   Carbonated soft drinks.  Do not take salt tablets. This can lead to the condition of having too much sodium in your body (hypernatremia).  SEEK MEDICAL CARE IF:  You cannot eat or drink without vomiting.  You have had moderate diarrhea during a period of more than 24 hours.  You have a fever. SEEK IMMEDIATE MEDICAL CARE IF:   You have extreme thirst.  You have severe diarrhea.  You have not  urinated in 6-8 hours, or you have urinated only a small amount of very dark urine.  You have shriveled skin.  You are dizzy, confused, or both.   This information is not intended to replace advice given to you by your health care provider. Make sure you discuss any questions you have with your health care provider.   Document Released: 02/28/2005 Document Revised: 11/19/2014 Document Reviewed: 07/16/2014 Elsevier Interactive Patient Education [employer]

## 2015-10-27 NOTE — MAU Provider Note (Signed)
Chief Complaint: Abdominal Pain   First Provider Initiated Contact with Patient 10/27/15 1858     Chief Complaint  Patient presents with  . Abdominal Pain       SUBJECTIVE Abdominal Cramping  This is a new problem. The current episode started in the past 7 days. The onset quality is gradual. The problem occurs intermittently. The problem has been waxing and waning. The pain is located in the RLQ. The pain is mild. The abdominal pain does not radiate. Associated symptoms include frequency. Pertinent negatives include no constipation, diarrhea, dysuria, fever, myalgias, nausea or vomiting. Nothing aggravates the pain. The pain is relieved by nothing. She has tried nothing for the symptoms.    Sharon Waller is a 20 y.o. G1P0 at 3353w6d by LMP who presents to maternity admissions reporting pressure in lower abdomen, mostly RLQ.  It comes and goes.  Also has frequency .Marland Kitchen. She denies vaginal bleeding, vaginal itching/burning, urinary symptoms, h/a, dizziness, n/v, or fever/chills.    RN Note: Been having this pressure feeling in RLQ, comes and goes.. Noted that she was peeing a lot when she was having the pressure. Denies pain or burning. Yesterday, had a dizzy episode at work.   Past Medical History:  Diagnosis Date  . Asthma   . Bacterial vaginosis   . Bronchitis   . UTI (lower urinary tract infection)    History reviewed. No pertinent surgical history. Social History   Social History  . Marital status: Single    Spouse name: N/A  . Number of children: N/A  . Years of education: N/A   Occupational History  . Not on file.   Social History Main Topics  . Smoking status: Former Smoker    Types: Cigarettes  . Smokeless tobacco: Never Used  . Alcohol use No  . Drug use: No  . Sexual activity: Yes    Birth control/ protection: None   Other Topics Concern  . Not on file   Social History Narrative  . No narrative on file   No current facility-administered medications on  file prior to encounter.    Current Outpatient Prescriptions on File Prior to Encounter  Medication Sig Dispense Refill  . diphenhydrAMINE (SOMINEX) 25 MG tablet Take 25 mg by mouth at bedtime as needed for itching or sleep.    Marland Kitchen. ibuprofen (ADVIL,MOTRIN) 200 MG tablet Take 400-600 mg by mouth 2 (two) times daily as needed for moderate pain.     . metroNIDAZOLE (FLAGYL) 500 MG tablet Take 1 tablet (500 mg total) by mouth 3 (three) times daily. 21 tablet 0  . Prenatal Vit-Fe Fumarate-FA (PRENATAL COMPLETE) 14-0.4 MG TABS Take 1 tablet by mouth daily. 60 each 0  . promethazine (PHENERGAN) 25 MG tablet Take 1 tablet (25 mg total) by mouth every 6 (six) hours as needed for nausea or vomiting. 30 tablet 0  . [DISCONTINUED] dicyclomine (BENTYL) 20 MG tablet Take 1 tablet (20 mg total) by mouth 2 (two) times daily as needed for spasms. (Patient not taking: Reported on 01/29/2014) 20 tablet 0   Allergies  Allergen Reactions  . Augmentin [Amoxicillin-Pot Clavulanate] Rash    I have reviewed patient's Past Medical Hx, Surgical Hx, Family Hx, Social Hx, medications and allergies.   ROS:  Review of Systems  Constitutional: Negative for chills and fever.  Gastrointestinal: Negative for constipation, diarrhea, nausea and vomiting.  Genitourinary: Positive for frequency. Negative for difficulty urinating, dysuria, vaginal bleeding and vaginal discharge.  Musculoskeletal: Negative for myalgias.  Other systems negative  Physical Exam  Patient Vitals for the past 24 hrs:  BP Temp Temp src Pulse Resp Weight  10/27/15 1742 100/70 98.3 F (36.8 C) Oral 71 16 49.3 kg (108 lb 9.6 oz)   Physical Exam  Constitutional: Well-developed, well-nourished female in no acute distress.  Cardiovascular: normal rate Respiratory: normal effort GI: Abd soft, non-tender. Pos BS x 4 MS: Extremities nontender, no edema, normal ROM Neurologic: Alert and oriented x 4.  GU: Neg CVAT.  PELVIC EXAM: Cervix pink,  visually closed, without lesion, scant white creamy discharge, vaginal walls and external genitalia normal Bimanual exam: Cervix 0/long/high, firm, anterior, neg CMT, uterus nontender, nonenlarged, adnexa without tenderness, enlargement, or mass  FHT 153 by doppler   LAB RESULTS Results for orders placed or performed during the hospital encounter of 10/27/15 (from the past 24 hour(s))  Urinalysis, Routine w reflex microscopic (not at [facility])     Status: Abnormal   Collection Time: 10/27/15  5:45 PM  Result Value Ref Range   Color, Urine YELLOW YELLOW   APPearance CLEAR CLEAR   Specific Gravity, Urine >1.030 (H) 1.005 - 1.030   pH 6.0 5.0 - 8.0   Glucose, UA NEGATIVE NEGATIVE mg/dL   Hgb urine dipstick NEGATIVE NEGATIVE   Bilirubin Urine NEGATIVE NEGATIVE   Ketones, ur NEGATIVE NEGATIVE mg/dL   Protein, ur NEGATIVE NEGATIVE mg/dL   Nitrite NEGATIVE NEGATIVE   Leukocytes, UA NEGATIVE NEGATIVE       IMAGING No results found.  MAU Management/MDM: Sent UA to rule out UTI UA was normal, doubt UTI Good FHR with cervix closed, doubt threatened abortion Suspect may be bowel.  ASSESSMENT No diagnosis found.  PLAN Discharge home Push PO fluids Phenergan as needed Normal diet with high fiber Miralax for constipation PRN Follow up for prenatal care   Pt stable at time of discharge. Encouraged to return here or to other Urgent Care/ED if she develops worsening of symptoms, increase in pain, fever, or other concerning symptoms.    Sharon Waller CNM, MSN Certified Nurse-Midwife 10/27/2015  7:04 PM

## 2015-11-12 ENCOUNTER — Ambulatory Visit (INDEPENDENT_AMBULATORY_CARE_PROVIDER_SITE_OTHER): Payer: Medicaid Other | Admitting: Family Medicine

## 2015-11-12 ENCOUNTER — Encounter: Payer: Self-pay | Admitting: Family Medicine

## 2015-11-12 VITALS — BP 113/72 | HR 72 | Wt 110.0 lb

## 2015-11-12 DIAGNOSIS — Z3492 Encounter for supervision of normal pregnancy, unspecified, second trimester: Secondary | ICD-10-CM | POA: Diagnosis not present

## 2015-11-12 DIAGNOSIS — Z3490 Encounter for supervision of normal pregnancy, unspecified, unspecified trimester: Secondary | ICD-10-CM | POA: Insufficient documentation

## 2015-11-12 LAB — POCT URINALYSIS DIP (DEVICE)
BILIRUBIN URINE: NEGATIVE
Glucose, UA: NEGATIVE mg/dL
HGB URINE DIPSTICK: NEGATIVE
KETONES UR: NEGATIVE mg/dL
LEUKOCYTES UA: NEGATIVE
Nitrite: NEGATIVE
Protein, ur: NEGATIVE mg/dL
SPECIFIC GRAVITY, URINE: 1.025 (ref 1.005–1.030)
Urobilinogen, UA: 0.2 mg/dL (ref 0.0–1.0)
pH: 6 (ref 5.0–8.0)

## 2015-11-12 NOTE — Progress Notes (Signed)
  Subjective:    Sharon Waller is a G1P0000 1980w5d being seen today for her first obstetrical visit.  Her obstetrical history is insignificant . Patient uncertain about breast feed. Pregnancy history fully reviewed.  Patient reports nausea.  Vitals:   11/12/15 1251  BP: 113/72  Pulse: 72  Weight: 110 lb (49.9 kg)    HISTORY: OB History  Gravida Para Term Preterm AB Living  1 0 0 0 0 0  SAB TAB Ectopic Multiple Live Births  0 0 0 0 0    # Outcome Date GA Lbr Len/2nd Weight Sex Delivery Anes PTL Lv  1 Current              Past Medical History:  Diagnosis Date  . Asthma   . Bacterial vaginosis   . Bronchitis   . UTI (lower urinary tract infection)    Past Surgical History:  Procedure Laterality Date  . NO PAST SURGERIES     Family History  Problem Relation Age of Onset  . Cancer Maternal Aunt   . Cancer Maternal Grandmother      Exam    Uterus:     Pelvic Exam:   System:     Skin: normal coloration and turgor, no rashes    Neurologic: gait normal; reflexes normal and symmetric   Extremities: normal strength, tone, and muscle mass   HEENT PERRLA and extra ocular movement intact   Mouth/Teeth mucous membranes moist, pharynx normal without lesions   Neck supple and no masses   Cardiovascular: regular rate and rhythm, no murmurs or gallops   Respiratory:  appears well, vitals normal, no respiratory distress, acyanotic, normal RR, ear and throat exam is normal, neck free of mass or lymphadenopathy, chest clear, no wheezing, crepitations, rhonchi, normal symmetric air entry   Abdomen: soft, non-tender; bowel sounds normal; no masses,  no organomegaly          Assessment:    Pregnancy: G1P0000 Patient Active Problem List   Diagnosis Date Noted  . Supervision of low-risk pregnancy 11/12/2015  . Abdominal pain affecting pregnancy, antepartum 09/26/2015        Plan:     Initial labs drawn. Prenatal vitamins. Problem list reviewed and  updated. Genetic Screening discussed Quad Screen: requested.  Ultrasound discussed; fetal survey: ordered.  Follow up in 4 weeks. 100% of 30 min visit spent on counseling and coordination of care.     Candelaria CelesteSTINSON, Leata Dominy JEHIEL 11/12/2015

## 2015-11-13 LAB — PRENATAL PROFILE (SOLSTAS)
Antibody Screen: NEGATIVE
BASOS ABS: 0 {cells}/uL (ref 0–200)
BASOS PCT: 0 %
EOS ABS: 70 {cells}/uL (ref 15–500)
Eosinophils Relative: 1 %
HCT: 35.7 % (ref 35.0–45.0)
HIV: NONREACTIVE
Hemoglobin: 11.8 g/dL (ref 11.7–15.5)
Hepatitis B Surface Ag: NEGATIVE
LYMPHS PCT: 22 %
Lymphs Abs: 1540 cells/uL (ref 850–3900)
MCH: 30.7 pg (ref 27.0–33.0)
MCHC: 33.1 g/dL (ref 32.0–36.0)
MCV: 93 fL (ref 80.0–100.0)
MONO ABS: 420 {cells}/uL (ref 200–950)
MPV: 9.8 fL (ref 7.5–12.5)
Monocytes Relative: 6 %
Neutro Abs: 4970 cells/uL (ref 1500–7800)
Neutrophils Relative %: 71 %
PLATELETS: 278 10*3/uL (ref 140–400)
RBC: 3.84 MIL/uL (ref 3.80–5.10)
RDW: 14.9 % (ref 11.0–15.0)
RUBELLA: 2.61 {index} — AB (ref <0.90)
Rh Type: POSITIVE
WBC: 7 10*3/uL (ref 3.8–10.8)

## 2015-11-13 LAB — PAIN MGMT, PROFILE 6 CONF W/O MM, U
6 ACETYLMORPHINE: NEGATIVE ng/mL (ref <10)
ALCOHOL METABOLITES: NEGATIVE ng/mL (ref <500)
Amphetamines: NEGATIVE ng/mL (ref <500)
BARBITURATES: NEGATIVE ng/mL (ref <300)
Benzodiazepines: NEGATIVE ng/mL (ref <100)
COCAINE METABOLITE: NEGATIVE ng/mL (ref <150)
Creatinine: 129.3 mg/dL (ref >=20.0)
METHADONE METABOLITE: NEGATIVE ng/mL (ref <100)
Marijuana Metabolite: NEGATIVE ng/mL (ref <20)
OXYCODONE: NEGATIVE ng/mL (ref <100)
Opiates: NEGATIVE ng/mL (ref <100)
Oxidant: NEGATIVE ug/mL (ref <200)
PHENCYCLIDINE: NEGATIVE ng/mL (ref <25)
PLEASE NOTE: 0
pH: 6.42 (ref 4.5–9.0)

## 2015-11-13 LAB — CULTURE, OB URINE: Organism ID, Bacteria: NO GROWTH

## 2015-11-16 LAB — HEMOGLOBINOPATHY EVALUATION
HCT: 35.7 % (ref 35.0–45.0)
HEMOGLOBIN: 11.8 g/dL (ref 11.7–15.5)
Hgb A2 Quant: 2.4 % (ref 1.8–3.5)
Hgb A: 96.6 % (ref >96.0)
Hgb F Quant: <1 % (ref <2.0)
MCH: 30.7 pg (ref 27.0–33.0)
MCV: 93 fL (ref 80.0–100.0)
RBC: 3.84 MIL/uL (ref 3.80–5.10)
RDW: 14.9 % (ref 11.0–15.0)

## 2015-11-18 LAB — PAIN MGMT, PROFILE 6 CONF W/O MM, U: Please note:: 0

## 2015-11-20 NOTE — Progress Notes (Signed)
Addendeum: Home Risk screening form completed.

## 2015-12-08 ENCOUNTER — Encounter (HOSPITAL_COMMUNITY): Payer: Self-pay | Admitting: Family Medicine

## 2015-12-10 ENCOUNTER — Ambulatory Visit (INDEPENDENT_AMBULATORY_CARE_PROVIDER_SITE_OTHER): Payer: Medicaid Other | Admitting: Advanced Practice Midwife

## 2015-12-10 VITALS — BP 107/64 | HR 75 | Wt 116.0 lb

## 2015-12-10 DIAGNOSIS — Z3492 Encounter for supervision of normal pregnancy, unspecified, second trimester: Secondary | ICD-10-CM

## 2015-12-10 NOTE — Progress Notes (Signed)
Declines flu shot

## 2015-12-10 NOTE — Progress Notes (Signed)
   PRENATAL VISIT NOTE  Subjective:  Sharon Waller is a 20 y.o. G1P0000 at 5190w5d being seen today for ongoing prenatal care.  She is currently monitored for the following issues for this low-risk pregnancy and has Abdominal pain affecting pregnancy, antepartum and Supervision of low-risk pregnancy on her problem list.  Patient reports rare contractions.  Contractions: Not present. Vag. Bleeding: None.  Movement: Present. Denies leaking of fluid.   The following portions of the patient's history were reviewed and updated as appropriate: allergies, current medications, past family history, past medical history, past social history, past surgical history and problem list. Problem list updated.  Objective:   Vitals:   12/10/15 0948  BP: 107/64  Pulse: 75  Weight: 116 lb (52.6 kg)    Fetal Status: Fetal Heart Rate (bpm): 138 Fundal Height: 17 cm Movement: Present     General:  Alert, oriented and cooperative. Patient is in no acute distress.  Skin: Skin is warm and dry. No rash noted.   Cardiovascular: Normal heart rate noted  Respiratory: Normal respiratory effort, no problems with respiration noted  Abdomen: Soft, gravid, appropriate for gestational age. Pain/Pressure: Absent     Pelvic:  Cervical exam deferred        Extremities: Normal range of motion.  Edema: None  Mental Status: Normal mood and affect. Normal behavior. Normal judgment and thought content.   Urinalysis:      Assessment and Plan:  Pregnancy: G1P0000 at 3690w5d  1. Supervision of low-risk pregnancy, second trimester  - AFP, Quad Screen  Preterm labor symptoms and general obstetric precautions including but not limited to vaginal bleeding, contractions, leaking of fluid and fetal movement were reviewed in detail with the patient. Please refer to After Visit Summary for other counseling recommendations.  F/U 4 weeks  Dorathy KinsmanVirginia Marvetta Vohs, [place]

## 2015-12-10 NOTE — Patient Instructions (Signed)

## 2015-12-14 LAB — AFP, QUAD SCREEN
AFP: 54.3 ng/mL
Age Alone: 1:1130 {titer}
CURR GEST AGE: 17.7 wk
Down Syndrome Scr Risk Est: 1:38500 {titer}
HCG, Total: 15.05 IU/mL
INH: 162.2 pg/mL
INTERPRETATION-AFP: NEGATIVE
MoM for AFP: 1
MoM for INH: 0.88
MoM for hCG: 0.4
OPEN SPINA BIFIDA: NEGATIVE
Osb Risk: 1:26500 {titer}
Tri 18 Scr Risk Est: NEGATIVE
uE3 Mom: 1.25
uE3 Value: 1.6 ng/mL

## 2015-12-17 ENCOUNTER — Other Ambulatory Visit: Payer: Self-pay | Admitting: Family Medicine

## 2015-12-17 ENCOUNTER — Ambulatory Visit (HOSPITAL_COMMUNITY)
Admission: RE | Admit: 2015-12-17 | Discharge: 2015-12-17 | Disposition: A | Discharge status: Home / Self Care | Payer: Medicaid Other | Source: Ambulatory Visit | Attending: Family Medicine | Admitting: Family Medicine

## 2015-12-17 DIAGNOSIS — Z3689 Encounter for other specified antenatal screening: Secondary | ICD-10-CM

## 2015-12-17 DIAGNOSIS — O358XX0 Maternal care for other (suspected) fetal abnormality and damage, not applicable or unspecified: Secondary | ICD-10-CM | POA: Diagnosis not present

## 2015-12-17 DIAGNOSIS — Z3A18 18 weeks gestation of pregnancy: Secondary | ICD-10-CM

## 2015-12-17 DIAGNOSIS — O359XX0 Maternal care for (suspected) fetal abnormality and damage, unspecified, not applicable or unspecified: Secondary | ICD-10-CM

## 2015-12-17 DIAGNOSIS — Z363 Encounter for antenatal screening for malformations: Secondary | ICD-10-CM | POA: Diagnosis not present

## 2015-12-17 DIAGNOSIS — Z3492 Encounter for supervision of normal pregnancy, unspecified, second trimester: Secondary | ICD-10-CM

## 2015-12-17 NOTE — Addendum Note (Signed)
Encounter addended by: Levonne Hubertarrie M Lealand Elting on: 12/17/2015 10:15 AM<BR>    Actions taken: Imaging Exam ended

## 2016-01-07 ENCOUNTER — Ambulatory Visit (INDEPENDENT_AMBULATORY_CARE_PROVIDER_SITE_OTHER): Payer: Medicaid Other | Admitting: Obstetrics and Gynecology

## 2016-01-07 VITALS — BP 99/69 | HR 89 | Wt 120.0 lb

## 2016-01-07 DIAGNOSIS — IMO0001 Reserved for inherently not codable concepts without codable children: Secondary | ICD-10-CM

## 2016-01-07 DIAGNOSIS — O350XX0 Maternal care for (suspected) central nervous system malformation in fetus, not applicable or unspecified: Secondary | ICD-10-CM

## 2016-01-07 DIAGNOSIS — IMO0002 Reserved for concepts with insufficient information to code with codable children: Secondary | ICD-10-CM

## 2016-01-07 DIAGNOSIS — Z3492 Encounter for supervision of normal pregnancy, unspecified, second trimester: Secondary | ICD-10-CM

## 2016-01-07 HISTORY — DX: Reserved for concepts with insufficient information to code with codable children: IMO0002

## 2016-01-07 NOTE — Progress Notes (Signed)
Prenatal Visit Note Date: 01/07/2016 Clinic: [facility]  Subjective:  Sharon Waller is a 20 y.o. G1P0000 at 3171w5d being seen today for ongoing prenatal care.  She is currently monitored for the following issues for this low-risk pregnancy and has Abdominal pain affecting pregnancy, antepartum; Supervision of low-risk pregnancy; and Choroid plexus cyst of fetus on her problem list.  Patient reports no complaints.   Contractions: Not present. Vag. Bleeding: None.  Movement: Present. Denies leaking of fluid.   The following portions of the patient's history were reviewed and updated as appropriate: allergies, current medications, past family history, past medical history, past social history, past surgical history and problem list. Problem list updated.  Objective:   Vitals:   01/07/16 0953  BP: 99/69  Pulse: 89  Weight: 120 lb (54.4 kg)    Fetal Status: Fetal Heart Rate (bpm): 140   Movement: Present     General:  Alert, oriented and cooperative. Patient is in no acute distress.  Skin: Skin is warm and dry. No rash noted.   Cardiovascular: Normal heart rate noted  Respiratory: Normal respiratory effort, no problems with respiration noted  Abdomen: Soft, gravid, appropriate for gestational age. Pain/Pressure: Present     Pelvic:  Cervical exam deferred        Extremities: Normal range of motion.  Edema: None  Mental Status: Normal mood and affect. Normal behavior. Normal judgment and thought content.   Urinalysis:      Assessment and Plan:  Pregnancy: G1P0000 at 6971w5d  1. Encounter for supervision of low-risk pregnancy in second trimester Routine care  2. Choroid plexus cyst of fetus, single or unspecified fetus U/s results reviewed with patient and MFM recommendations for rpt u/s 6wks after anatomy scan but not already scheduled. Pt states that she wasn't aware of the CP cyst findings on u/s. I told her that it is a soft marker for aneuploidy and she  had a negative quad screen; the u/s didn't comment if they saw an unclenched hand. Pt states she'd like to formally see GC, so will set that up for after the u/s, b/c I told her that many times they resolve on their own - US MFM OB FOLLOW UP; Future - AMB MFM GENETICS REFERRAL  Preterm labor symptoms and general obstetric precautions including but not limited to vaginal bleeding, contractions, leaking of fluid and fetal movement were reviewed in detail with the patient. Please refer to After Visit Summary for other counseling recommendations.  4wk ROB   [place] Bingharlie Cross Jorge, MD

## 2016-01-07 NOTE — Progress Notes (Signed)
Breastfeeding discussed with patient  

## 2016-01-10 ENCOUNTER — Inpatient Hospital Stay (HOSPITAL_COMMUNITY)
Admission: AD | Admit: 2016-01-10 | Discharge: 2016-01-10 | Disposition: A | Discharge status: Home / Self Care | Payer: Medicaid Other | Source: Ambulatory Visit | Attending: Obstetrics & Gynecology | Admitting: Obstetrics & Gynecology

## 2016-01-10 ENCOUNTER — Encounter (HOSPITAL_COMMUNITY): Payer: Self-pay | Admitting: Certified Nurse Midwife

## 2016-01-10 DIAGNOSIS — O26899 Other specified pregnancy related conditions, unspecified trimester: Secondary | ICD-10-CM

## 2016-01-10 DIAGNOSIS — R1030 Lower abdominal pain, unspecified: Secondary | ICD-10-CM | POA: Insufficient documentation

## 2016-01-10 DIAGNOSIS — Z3A22 22 weeks gestation of pregnancy: Secondary | ICD-10-CM | POA: Insufficient documentation

## 2016-01-10 DIAGNOSIS — O219 Vomiting of pregnancy, unspecified: Secondary | ICD-10-CM

## 2016-01-10 DIAGNOSIS — O26892 Other specified pregnancy related conditions, second trimester: Secondary | ICD-10-CM | POA: Diagnosis not present

## 2016-01-10 DIAGNOSIS — R109 Unspecified abdominal pain: Secondary | ICD-10-CM

## 2016-01-10 DIAGNOSIS — Z87891 Personal history of nicotine dependence: Secondary | ICD-10-CM | POA: Insufficient documentation

## 2016-01-10 DIAGNOSIS — R110 Nausea: Secondary | ICD-10-CM | POA: Insufficient documentation

## 2016-01-10 DIAGNOSIS — Z880 Allergy status to penicillin: Secondary | ICD-10-CM | POA: Diagnosis not present

## 2016-01-10 MED ORDER — ONDANSETRON 8 MG PO TBDP
8.0000 mg | ORAL_TABLET | Freq: Once | ORAL | Status: AC
  Administered 2016-01-10: 8 mg via ORAL
  Filled 2016-01-10: qty 1

## 2016-01-10 NOTE — MAU Provider Note (Signed)
History   [name] is a 20 yo, BF, who presents to MAU via EMS with complaints of severe abd pain after having a BM x2. Pt states the BM was soft and not hard. Pt states her abd pain is better now but still hurting and a little nauseous at times. Pain is a 5/10. FM+; Pt states she ate [employer] yesterday for 1st time and thinks she has an upset stomach from that. Pt states she wanted to make sure baby was ok.   CSN: [id number]  Arrival date and time: 01/10/16 1119   None     Chief Complaint  Patient presents with  . Abdominal Pain  . Diarrhea   HPI  OB History    Gravida Para Term Preterm AB Living   1 0 0 0 0 0   SAB TAB Ectopic Multiple Live Births   0 0 0 0 0      Past Medical History:  Diagnosis Date  . Asthma   . Bacterial vaginosis   . Bronchitis   . UTI (lower urinary tract infection)     Past Surgical History:  Procedure Laterality Date  . NO PAST SURGERIES      Family History  Problem Relation Age of Onset  . Cancer Maternal Aunt   . Cancer Maternal Grandmother     Social History  Substance Use Topics  . Smoking status: Former Smoker    Types: Cigarettes  . Smokeless tobacco: Never Used  . Alcohol use No    Allergies:  Allergies  Allergen Reactions  . Augmentin [Amoxicillin-Pot Clavulanate] Rash    Has patient had a PCN reaction causing immediate rash, facial/tongue/throat swelling, SOB or lightheadedness with hypotension: Yes Has patient had a PCN reaction causing severe rash involving mucus membranes or skin necrosis: No Has patient had a PCN reaction that required hospitalization Yes Has patient had a PCN reaction occurring within the last 10 years: No If all of the above answers are "NO", then may proceed with Cephalosporin use.     Prescriptions Prior to Admission  Medication Sig Dispense Refill Last Dose  . Prenatal Vit-Fe Fumarate-FA (PRENATAL COMPLETE) 14-0.4 MG TABS Take 1 tablet by mouth daily. 60 each 0 01/09/2016 at Unknown  time    Review of Systems  Constitutional: Negative.   HENT: Negative.   Eyes: Negative.   Respiratory: Negative.   Cardiovascular: Negative.   Gastrointestinal: Positive for abdominal pain and nausea.       Upon palpation- pt denies any abd pain    Genitourinary: Negative.   Musculoskeletal: Negative.   Skin: Negative.   Neurological: Negative.   Endo/Heme/Allergies: Negative.   Psychiatric/Behavioral: Negative.    Physical Exam   Blood pressure 100/56, pulse 67, temperature 97.8 F (36.6 C), temperature source Oral, resp. rate 18, last menstrual period 09/08/2015.  Physical Exam  Constitutional: She is oriented to person, place, and time. She appears well-developed and well-nourished.  HENT:  Head: Normocephalic and atraumatic.  Eyes: Conjunctivae are normal. Pupils are equal, round, and reactive to light.  Neck: Normal range of motion. Neck supple.  Cardiovascular: Normal rate and regular rhythm.   Respiratory: Effort normal and breath sounds normal.  GI: Soft. Bowel sounds are normal.  Musculoskeletal: Normal range of motion.  Neurological: She is alert and oriented to person, place, and time.  Skin: Skin is warm and dry.  Psychiatric: She has a normal mood and affect. Her behavior is normal. Judgment and thought content normal.    MAU  Course  Procedures  MDM   Assessment and Plan  1. Lower abd pain in preg 2. IUP @ 22.1 wks  3. Nausea in pregn.   Plan: Will admin Zofran 8mg  ODT, obtain FHT's, and monitor x30 mins; discussed comforts measures and meds to take for nausea and diarrhea; will f/u with regular OB appt.  Wyvonnia DuskyMarie Lawson 01/10/2016, 11:47 AM

## 2016-01-10 NOTE — Discharge Instructions (Signed)
Second Trimester of Pregnancy °The second trimester is from week 13 through week 28, months 4 through 6. The second trimester is often a time when you feel your best. Your body has also adjusted to being pregnant, and you begin to feel better physically. Usually, morning sickness has lessened or quit completely, you may have more energy, and you may have an increase in appetite. The second trimester is also a time when the fetus is growing rapidly. At the end of the sixth month, the fetus is about 9 inches long and weighs about 1½ pounds. You will likely begin to feel the baby move (quickening) between 18 and 20 weeks of the pregnancy. °BODY CHANGES °Your body goes through many changes during pregnancy. The changes vary from woman to woman.  °· Your weight will continue to increase. You will notice your lower abdomen bulging out. °· You may begin to get stretch marks on your hips, abdomen, and breasts. °· You may develop headaches that can be relieved by medicines approved by your health care provider. °· You may urinate more often because the fetus is pressing on your bladder. °· You may develop or continue to have heartburn as a result of your pregnancy. °· You may develop constipation because certain hormones are causing the muscles that push waste through your intestines to slow down. °· You may develop hemorrhoids or swollen, bulging veins (varicose veins). °· You may have back pain because of the weight gain and pregnancy hormones relaxing your joints between the bones in your pelvis and as a result of a shift in weight and the muscles that support your balance. °· Your breasts will continue to grow and be tender. °· Your gums may bleed and may be sensitive to brushing and flossing. °· Dark spots or blotches (chloasma, mask of pregnancy) may develop on your face. This will likely fade after the baby is born. °· A dark line from your belly button to the pubic area (linea nigra) may appear. This will likely fade  after the baby is born. °· You may have changes in your hair. These can include thickening of your hair, rapid growth, and changes in texture. Some women also have hair loss during or after pregnancy, or hair that feels dry or thin. Your hair will most likely return to normal after your baby is born. °WHAT TO EXPECT AT YOUR PRENATAL VISITS °During a routine prenatal visit: °· You will be weighed to make sure you and the fetus are growing normally. °· Your blood pressure will be taken. °· Your abdomen will be measured to track your baby's growth. °· The fetal heartbeat will be listened to. °· Any test results from the previous visit will be discussed. °Your health care provider may ask you: °· How you are feeling. °· If you are feeling the baby move. °· If you have had any abnormal symptoms, such as leaking fluid, bleeding, severe headaches, or abdominal cramping. °· If you are using any tobacco products, including cigarettes, chewing tobacco, and electronic cigarettes. °· If you have any questions. °Other tests that may be performed during your second trimester include: °· Blood tests that check for: °¨ Low iron levels (anemia). °¨ Gestational diabetes (between 24 and 28 weeks). °¨ Rh antibodies. °· Urine tests to check for infections, diabetes, or protein in the urine. °· An ultrasound to confirm the proper growth and development of the baby. °· An amniocentesis to check for possible genetic problems. °· Fetal screens for spina bifida   and Down syndrome. °· HIV (human immunodeficiency virus) testing. Routine prenatal testing includes screening for HIV, unless you choose not to have this test. °HOME CARE INSTRUCTIONS  °· Avoid all smoking, herbs, alcohol, and unprescribed drugs. These chemicals affect the formation and growth of the baby. °· Do not use any tobacco products, including cigarettes, chewing tobacco, and electronic cigarettes. If you need help quitting, ask your health care provider. You may receive  counseling support and other resources to help you quit. °· Follow your health care provider's instructions regarding medicine use. There are medicines that are either safe or unsafe to take during pregnancy. °· Exercise only as directed by your health care provider. Experiencing uterine cramps is a good sign to stop exercising. °· Continue to eat regular, healthy meals. °· Wear a good support bra for breast tenderness. °· Do not use hot tubs, steam rooms, or saunas. °· Wear your seat belt at all times when driving. °· Avoid raw meat, uncooked cheese, cat litter boxes, and soil used by cats. These carry germs that can cause birth defects in the baby. °· Take your prenatal vitamins. °· Take 1500-2000 mg of calcium daily starting at the 20th week of pregnancy until you deliver your baby. °· Try taking a stool softener (if your health care provider approves) if you develop constipation. Eat more high-fiber foods, such as fresh vegetables or fruit and whole grains. Drink plenty of fluids to keep your urine clear or pale yellow. °· Take warm sitz baths to soothe any pain or discomfort caused by hemorrhoids. Use hemorrhoid cream if your health care provider approves. °· If you develop varicose veins, wear support hose. Elevate your feet for 15 minutes, 3-4 times a day. Limit salt in your diet. °· Avoid heavy lifting, wear low heel shoes, and practice good posture. °· Rest with your legs elevated if you have leg cramps or low back pain. °· Visit your dentist if you have not gone yet during your pregnancy. Use a soft toothbrush to brush your teeth and be gentle when you floss. °· A sexual relationship may be continued unless your health care provider directs you otherwise. °· Continue to go to all your prenatal visits as directed by your health care provider. °SEEK MEDICAL CARE IF:  °· You have dizziness. °· You have mild pelvic cramps, pelvic pressure, or nagging pain in the abdominal area. °· You have persistent nausea,  vomiting, or diarrhea. °· You have a bad smelling vaginal discharge. °· You have pain with urination. °SEEK IMMEDIATE MEDICAL CARE IF:  °· You have a fever. °· You are leaking fluid from your vagina. °· You have spotting or bleeding from your vagina. °· You have severe abdominal cramping or pain. °· You have rapid weight gain or loss. °· You have shortness of breath with chest pain. °· You notice sudden or extreme swelling of your face, hands, ankles, feet, or legs. °· You have not felt your baby move in over an hour. °· You have severe headaches that do not go away with medicine. °· You have vision changes. °  °This information is not intended to replace advice given to you by your health care provider. Make sure you discuss any questions you have with your health care provider. °  °Document Released: 02/22/2001 Document Revised: 03/21/2014 Document Reviewed: 05/01/2012 °Elsevier Interactive Patient Education ©2016 [employer] °Abdominal Pain During Pregnancy °Abdominal pain is common in pregnancy. Most of the time, it does not cause harm. There are many   causes of abdominal pain. Some causes are more serious than others. Some of the causes of abdominal pain in pregnancy are easily diagnosed. Occasionally, the diagnosis takes time to understand. Other times, the cause is not determined. Abdominal pain can be a sign that something is very wrong with the pregnancy, or the pain may have nothing to do with the pregnancy at all. For this reason, always tell your health care provider if you have any abdominal discomfort. °HOME CARE INSTRUCTIONS  °Monitor your abdominal pain for any changes. The following actions may help to alleviate any discomfort you are experiencing: °· Do not have sexual intercourse or put anything in your vagina until your symptoms go away completely. °· Get plenty of rest until your pain improves. °· Drink clear fluids if you feel nauseous. Avoid solid food as long as you are uncomfortable or  nauseous. °· Only take over-the-counter or prescription medicine as directed by your health care provider. °· Keep all follow-up appointments with your health care provider. °SEEK IMMEDIATE MEDICAL CARE IF: °· You are bleeding, leaking fluid, or passing tissue from the vagina. °· You have increasing pain or cramping. °· You have persistent vomiting. °· You have painful or bloody urination. °· You have a fever. °· You notice a decrease in your baby's movements. °· You have extreme weakness or feel faint. °· You have shortness of breath, with or without abdominal pain. °· You develop a severe headache with abdominal pain. °· You have abnormal vaginal discharge with abdominal pain. °· You have persistent diarrhea. °· You have abdominal pain that continues even after rest, or gets worse. °MAKE SURE YOU:  °· Understand these instructions. °· Will watch your condition. °· Will get help right away if you are not doing well or get worse. °  °This information is not intended to replace advice given to you by your health care provider. Make sure you discuss any questions you have with your health care provider. °  °Document Released: 02/28/2005 Document Revised: 12/19/2012 Document Reviewed: 09/27/2012 °Elsevier Interactive Patient Education ©2016 Elsevier Inc. ° °

## 2016-01-10 NOTE — MAU Note (Signed)
Pt arrives via EMS for abdominal pain. Pt ate [employer] yesterday and this AM she had new onset severe abdominal pain and diarrhea x2. Pt denies ctxs, LOF, or vaginal bleeding. Fetus is active.

## 2016-01-28 ENCOUNTER — Ambulatory Visit (HOSPITAL_COMMUNITY): Payer: Medicaid Other

## 2016-02-01 ENCOUNTER — Ambulatory Visit (HOSPITAL_COMMUNITY)
Admission: RE | Admit: 2016-02-01 | Discharge: 2016-02-01 | Disposition: A | Discharge status: Home / Self Care | Payer: Medicaid Other | Source: Ambulatory Visit | Attending: Obstetrics and Gynecology | Admitting: Obstetrics and Gynecology

## 2016-02-01 ENCOUNTER — Encounter (HOSPITAL_COMMUNITY): Payer: Self-pay

## 2016-02-01 ENCOUNTER — Other Ambulatory Visit: Payer: Self-pay | Admitting: Obstetrics and Gynecology

## 2016-02-01 DIAGNOSIS — IMO0001 Reserved for inherently not codable concepts without codable children: Secondary | ICD-10-CM

## 2016-02-01 DIAGNOSIS — Z315 Encounter for genetic counseling: Secondary | ICD-10-CM | POA: Diagnosis not present

## 2016-02-01 DIAGNOSIS — Z362 Encounter for other antenatal screening follow-up: Secondary | ICD-10-CM | POA: Diagnosis not present

## 2016-02-01 DIAGNOSIS — Z3482 Encounter for supervision of other normal pregnancy, second trimester: Secondary | ICD-10-CM

## 2016-02-01 DIAGNOSIS — Z3A25 25 weeks gestation of pregnancy: Secondary | ICD-10-CM

## 2016-02-01 DIAGNOSIS — O358XX0 Maternal care for other (suspected) fetal abnormality and damage, not applicable or unspecified: Secondary | ICD-10-CM | POA: Diagnosis not present

## 2016-02-01 NOTE — Progress Notes (Signed)
Genetic Counseling  High-Risk Gestation Note  Appointment Date:  02/01/2016 Referred By: [place] [facility] BingPickens, Charlie, MD Date of Birth:  13-Dec-1995   Pregnancy History: G1P0000 Estimated Date of Delivery: 05/14/16 Estimated Gestational Age: 752w2d Attending: Charlsie MerlesMark Newman, MD   Sharon Waller was seen for genetic counseling because of previous ultrasound finding of choroid plexus cyst.    In summary:  Reviewed previous ultrasound finding (right choroid plexus cyst) and the association with fetal aneuploidy  Discussed significance of prior screening for fetal aneuploidy  Quad screen within normal limits- reduced age related risk to 1 in 18,000 for Trisomy 1818  Offered additional screening  NIPS-declined  Discussed option of diagnostic testing  Amniocentesis-declined  Reviewed ACOG carrier screening options- patient declined  Reviewed family history concerns  Sharon Waller was seen previously for ultrasound [facility]/17[facility].  Ultrasound on that date revealed a right choroid plexus cyst (CPC); remaining visualized fetal anatomy was within normal range. Follow-up ultrasound performed today visualized fetal growth and anatomy within normal range. The ultrasound reports are under separate cover.    We discussed that the second trimester genetic sonogram is targeted at identifying features associated with aneuploidy.  It has evolved as a screening tool used to provide an individualized risk assessment for Down syndrome and other trisomies.  The ability of sonography to aid in the detection of aneuploidies relies on identification of both major structural anomalies and "soft markers."  The patient was counseled that the latter term refers to findings that are often normal variants and do not cause any significant medical problems.  Nonetheless, these markers have a known association with aneuploidy.    Sharon Waller was counseled that the choroid plexus is  an area in the brain where cerebral spinal fluid, the fluid that bathes the brain and spinal cord, is made.  Cysts, or fluid filled sacs, are sometimes found in the choroid plexus of babies both before and after they are born.  We discussed that approximately 1% of pregnancies evaluated by ultrasound will show choroid plexus cyst (CPCs).  Literature suggests that CPCs are an ultrasound finding in approximately 30-50% of fetuses with trisomy 18, but are an isolated finding in less than 10% of fetuses with trisomy 7418.  Ms. ,Sharon Waller was counseled that when a patient has other risk factors for fetal trisomy 2418 (abnormal First trimester or quad screening, advanced maternal age, or another ultrasound finding), CPCs are associated with an increased risk (LR of 9) for trisomy 5918.  Newer literature suggests that in the absence of other risk factors, CPCs are likely a normal variation of development or a benign finding.  CPCs are not associated with an increased risk for fetal Down syndrome.  We reviewed chromosomes, nondisjunction, and the common features and poor prognosis of trisomy 6018.  We also reviewed Sharon Waller's normal Quad screening result and the associated 1 in 18,000 risk for fetal trisomy 18.  Considering her maternal age of 20 y.o., her otherwise normal fetal anatomy ultrasound, and her normal Quad screening result, Sharon Waller's risk for fetal trisomy 18 is not expected to be increased above her screen adjusted risk.  However, additional testing options for detection of fetal trisomy 7718 were discussed.  We reviewed the availability of noninvasive prenatal screening (NIPS)/prenatal cell free DNA testing.  She was counseled that NIPS analyzes cell free placental DNA found in the maternal circulation. This test is not diagnostic for chromosome conditions, but can provide information  regarding the presence or absence of extra fetal DNA for specific chromosomes, including chromosome 18.  The reported  detection rate is greater than 98% for Trisomy 18 and the false positive rate is reported to be less than 0.1%.  In addition, we discussed the option of diagnostic testing via amniocentesis.  We reviewed the approximate 1 in 300-500 risk for complications, including spontaneous pregnancy loss or preterm labor and delivery. We reviewed that the risk for fetal aneuploidy is estimated to be lower than the risk of complications for amniocentesis at this time.  After consideration of all the options, she declined NIPS and amniocentesis, stating she was comfortable with the current risk assessment provided.   Sharon Waller was provided with written information regarding cystic fibrosis (CF), spinal muscular atrophy (SMA) and hemoglobinopathies including the carrier frequency, availability of carrier screening and prenatal diagnosis if indicated.  In addition, we discussed that CF and hemoglobinopathies are routinely screened for as part of the [place] newborn screening panel. Hemoglobin electrophoresis was previously performed and was within normal range.  After further discussion, she declined screening for CF and SMA.  Both family histories were reviewed and found to be noncontributory for birth defects, intellectual disability, and known genetic conditions. Sharon Waller was not familiar with the father of the baby's extended family history.  We, therefore, cannot comment on how his history might contribute to the overall chance for the baby to have a birth defect.   Without further information regarding the provided family history, an accurate genetic risk cannot be calculated. Further genetic counseling is warranted if more information is obtained.  [name] denied exposure to environmental toxins or chemical agents. She denied the use of alcohol, tobacco or street drugs. She denied significant viral illnesses during the course of her pregnancy. Her medical and surgical histories were  noncontributory.   I counseled [name] regarding the above risks and available options.  The approximate face-to-face time with the genetic counselor was 20 minutes.      Quinn PlowmanKaren Zsofia Prout, MS Certified Genetic Counselor 02/01/2016

## 2016-02-03 ENCOUNTER — Ambulatory Visit (INDEPENDENT_AMBULATORY_CARE_PROVIDER_SITE_OTHER): Payer: Medicaid Other | Admitting: Obstetrics & Gynecology

## 2016-02-03 VITALS — BP 103/58 | HR 66 | Wt 125.7 lb

## 2016-02-03 DIAGNOSIS — Z3492 Encounter for supervision of normal pregnancy, unspecified, second trimester: Secondary | ICD-10-CM

## 2016-02-03 NOTE — Progress Notes (Signed)
   PRENATAL VISIT NOTE  Subjective:  Sharon Waller is a 20 y.o. G1P0000 at 6265w4d being seen today for ongoing prenatal care.  She is currently monitored for the following issues for this low-risk pregnancy and has Abdominal pain affecting pregnancy, antepartum and Supervision of low-risk pregnancy on her problem list.  Patient reports no complaints.  Contractions: Not present. Vag. Bleeding: None.  Movement: Present. Denies leaking of fluid.   The following portions of the patient's history were reviewed and updated as appropriate: allergies, current medications, past family history, past medical history, past social history, past surgical history and problem list. Problem list updated.  Objective:   Vitals:   02/03/16 1612  BP: (!) 103/58  Pulse: 66  Weight: 125 lb 11.2 oz (57 kg)    Fetal Status: Fetal Heart Rate (bpm): 145 Fundal Height: 25 cm Movement: Present     General:  Alert, oriented and cooperative. Patient is in no acute distress.  Skin: Skin is warm and dry. No rash noted.   Cardiovascular: Normal heart rate noted  Respiratory: Normal respiratory effort, no problems with respiration noted  Abdomen: Soft, gravid, appropriate for gestational age. Pain/Pressure: Present     Pelvic:  Cervical exam deferred        Extremities: Normal range of motion.  Edema: None  Mental Status: Normal mood and affect. Normal behavior. Normal judgment and thought content.   Assessment and Plan:  Pregnancy: G1P0000 at 7765w4d  1. Encounter for supervision of low-risk pregnancy in second trimester F/u US reviewed, normal  Preterm labor symptoms and general obstetric precautions including but not limited to vaginal bleeding, contractions, leaking of fluid and fetal movement were reviewed in detail with the patient. Please refer to After Visit Summary for other counseling recommendations.  Return in about 3 weeks (around 02/24/2016). 2 hr GTT  Adam PhenixJames G Arnold, MD

## 2016-02-22 ENCOUNTER — Ambulatory Visit (INDEPENDENT_AMBULATORY_CARE_PROVIDER_SITE_OTHER): Payer: Medicaid Other | Admitting: Obstetrics and Gynecology

## 2016-02-22 VITALS — BP 101/58 | HR 74 | Wt 126.1 lb

## 2016-02-22 DIAGNOSIS — Z3492 Encounter for supervision of normal pregnancy, unspecified, second trimester: Secondary | ICD-10-CM

## 2016-02-22 NOTE — Progress Notes (Signed)
Prenatal Visit Note Date: 02/22/2016 Clinic: [facility]  Subjective:  Sharon Waller is a [age] y.o. G1P0000 at 4717w2d being seen today for ongoing prenatal care.  She is currently monitored for the following issues for this low-risk pregnancy and has Supervision of low-risk pregnancy on her problem list.  Patient reports no complaints.   Contractions: Not present.  .  Movement: Present. Denies leaking of fluid.   The following portions of the patient's history were reviewed and updated as appropriate: allergies, current medications, past family history, past medical history, past social history, past surgical history and problem list. Problem list updated.  Objective:   Vitals:   02/22/16 0936  BP: (!) 101/58  Pulse: 74  Weight: 126 lb 1.6 oz (57.2 kg)    Fetal Status: Fetal Heart Rate (bpm): 137 Fundal Height: 28 cm Movement: Present     General:  Alert, oriented and cooperative. Patient is in no acute distress.  Skin: Skin is warm and dry. No rash noted.   Cardiovascular: Normal heart rate noted  Respiratory: Normal respiratory effort, no problems with respiration noted  Abdomen: Soft, gravid, appropriate for gestational age. Pain/Pressure: Present     Pelvic:  Cervical exam deferred        Extremities: Normal range of motion.     Mental Status: Normal mood and affect. Normal behavior. Normal judgment and thought content.   Urinalysis:      Assessment and Plan:  Pregnancy: G1P0000 at 4817w2d  1. Encounter for supervision of low-risk pregnancy in second trimester Routine labs.  - HIV antibody - RPR - CBC - 2Hr GTT w/ 1 Hr Carpenter 75 g  Preterm labor symptoms and general obstetric precautions including but not limited to vaginal bleeding, contractions, leaking of fluid and fetal movement were reviewed in detail with the patient. Please refer to After Visit Summary for other counseling recommendations.  Return in about 2 weeks (around 03/07/2016)  for 2-3wk rob.   Tazlina Bingharlie Idora Brosious, MD

## 2016-02-23 LAB — CBC
HCT: 32.7 % — ABNORMAL LOW (ref 35.0–45.0)
Hemoglobin: 10.6 g/dL — ABNORMAL LOW (ref 11.7–15.5)
MCH: 30.7 pg (ref 27.0–33.0)
MCHC: 32.4 g/dL (ref 32.0–36.0)
MCV: 94.8 fL (ref 80.0–100.0)
MPV: 9.7 fL (ref 7.5–12.5)
PLATELETS: 233 10*3/uL (ref 140–400)
RBC: 3.45 MIL/uL — ABNORMAL LOW (ref 3.80–5.10)
RDW: 13.9 % (ref 11.0–15.0)
WBC: 6.2 10*3/uL (ref 3.8–10.8)

## 2016-02-23 LAB — 2HR GTT W 1 HR, CARPENTER, 75 G
GLUCOSE, 1 HR, GEST: 101 mg/dL (ref <180)
GLUCOSE, FASTING, GEST: 65 mg/dL (ref 65–91)
Glucose, 2 Hr, Gest: 98 mg/dL (ref <153)

## 2016-02-23 LAB — HIV ANTIBODY (ROUTINE TESTING W REFLEX): HIV: NONREACTIVE

## 2016-02-23 LAB — RPR

## 2016-02-29 ENCOUNTER — Encounter: Payer: Self-pay | Admitting: Obstetrics and Gynecology

## 2016-03-14 NOTE — L&D Delivery Note (Signed)
Delivery Note At 6:01 AM a viable female was delivered via Vaginal, Spontaneous Delivery (Presentation:vertex ; LOA ).  APGAR:9 , 9 ; weight pending .   Placenta status: delivered intact with gentle traction  Cord: 3 vessel with the following complications: none .  Cord pH: not collected  Anesthesia:  epidural Episiotomy: None Lacerations: 1st degree Suture Repair: 3.0 vicryl Est. Blood Loss (mL): 100  Mom to postpartum.  Baby to Couplet care / Skin to Skin.  Ernestina PennaNicholas Schenk MD 05/04/2016, 6:18 AM

## 2016-03-18 ENCOUNTER — Encounter (HOSPITAL_COMMUNITY): Payer: Self-pay

## 2016-03-18 ENCOUNTER — Inpatient Hospital Stay (HOSPITAL_COMMUNITY)
Admission: AD | Admit: 2016-03-18 | Discharge: 2016-03-18 | Disposition: A | Discharge status: Home / Self Care | Payer: Medicaid Other | Source: Ambulatory Visit | Attending: Obstetrics & Gynecology | Admitting: Obstetrics & Gynecology

## 2016-03-18 DIAGNOSIS — Z3A31 31 weeks gestation of pregnancy: Secondary | ICD-10-CM

## 2016-03-18 DIAGNOSIS — Z87891 Personal history of nicotine dependence: Secondary | ICD-10-CM | POA: Insufficient documentation

## 2016-03-18 DIAGNOSIS — Z8619 Personal history of other infectious and parasitic diseases: Secondary | ICD-10-CM | POA: Diagnosis not present

## 2016-03-18 DIAGNOSIS — O4703 False labor before 37 completed weeks of gestation, third trimester: Secondary | ICD-10-CM

## 2016-03-18 DIAGNOSIS — O368130 Decreased fetal movements, third trimester, not applicable or unspecified: Secondary | ICD-10-CM | POA: Diagnosis present

## 2016-03-18 DIAGNOSIS — O99513 Diseases of the respiratory system complicating pregnancy, third trimester: Secondary | ICD-10-CM | POA: Insufficient documentation

## 2016-03-18 DIAGNOSIS — Z808 Family history of malignant neoplasm of other organs or systems: Secondary | ICD-10-CM | POA: Diagnosis not present

## 2016-03-18 DIAGNOSIS — Z809 Family history of malignant neoplasm, unspecified: Secondary | ICD-10-CM | POA: Diagnosis not present

## 2016-03-18 DIAGNOSIS — Z87440 Personal history of urinary (tract) infections: Secondary | ICD-10-CM | POA: Diagnosis not present

## 2016-03-18 LAB — FETAL FIBRONECTIN: FETAL FIBRONECTIN: NEGATIVE

## 2016-03-18 MED ORDER — NIFEDIPINE 10 MG PO CAPS
10.0000 mg | ORAL_CAPSULE | Freq: Three times a day (TID) | ORAL | Status: DC
  Administered 2016-03-18: 10 mg via ORAL
  Filled 2016-03-18 (×2): qty 1

## 2016-03-18 MED ORDER — NIFEDIPINE 10 MG PO CAPS
10.0000 mg | ORAL_CAPSULE | Freq: Once | ORAL | Status: AC
  Administered 2016-03-18: 10 mg via ORAL

## 2016-03-18 MED ORDER — GI COCKTAIL ~~LOC~~: 30.0000 mL | Freq: Once | ORAL | Status: DC

## 2016-03-18 NOTE — Progress Notes (Signed)
Upon discharge patient c/o chest pain, notified Wynelle BourgeoisMarie Williams CNM

## 2016-03-18 NOTE — MAU Note (Signed)
Decreased fetal movement has been since Wednesday.  Patient is seen at [facility] for her prenatal care.

## 2016-03-18 NOTE — MAU Note (Signed)
Patient presents with decreased fetal movement, denies vaginal bleeding or lof

## 2016-03-18 NOTE — MAU Note (Signed)
Urine in lab 

## 2016-03-18 NOTE — MAU Provider Note (Signed)
Chief Complaint:  Decreased Fetal Movement   First Provider Initiated Contact with Patient 03/18/16 1037     HPI: Charlena I Lathon is a 21 y.o. G1P0000 at 9w6dwho presents to maternity admissions reporting decreased fetal movement.  . She denies LOF, vaginal bleeding, vaginal itching/burning, urinary symptoms, h/a, dizziness, n/v, diarrhea, constipation or fever/chills.  She denies headache, visual changes or RUQ abdominal pain.  Other  This is a new problem. The current episode started today. The problem has been unchanged. Pertinent negatives include no abdominal pain, chest pain, chills, coughing, fatigue, fever, headaches, myalgias, nausea or vomiting. Nothing aggravates the symptoms. She has tried nothing for the symptoms.   RN Note: Patient presents with decreased fetal movement, denies vaginal bleeding or lof  Past Medical History: Past Medical History:  Diagnosis Date  . Asthma   . Bacterial vaginosis   . Bronchitis   . UTI (lower urinary tract infection)     Past obstetric history: OB History  Gravida Para Term Preterm AB Living  1 0 0 0 0 0  SAB TAB Ectopic Multiple Live Births  0 0 0 0 0    # Outcome Date GA Lbr Len/2nd Weight Sex Delivery Anes PTL Lv  1 Current               Past Surgical History: Past Surgical History:  Procedure Laterality Date  . NO PAST SURGERIES      Family History: Family History  Problem Relation Age of Onset  . Cancer Maternal Aunt   . Cancer Maternal Grandmother     Social History: Social History  Substance Use Topics  . Smoking status: Former Smoker    Types: Cigarettes  . Smokeless tobacco: Never Used  . Alcohol use No    Allergies:  Allergies  Allergen Reactions  . Augmentin [Amoxicillin-Pot Clavulanate] Rash    Has patient had a PCN reaction causing immediate rash, facial/tongue/throat swelling, SOB or lightheadedness with hypotension: Yes Has patient had a PCN reaction causing severe rash involving mucus  membranes or skin necrosis: No Has patient had a PCN reaction that required hospitalization Yes Has patient had a PCN reaction occurring within the last 10 years: No If all of the above answers are "NO", then may proceed with Cephalosporin use.     Meds:  Prescriptions Prior to Admission  Medication Sig Dispense Refill Last Dose  . Prenatal Vit-Fe Fumarate-FA (PRENATAL COMPLETE) 14-0.4 MG TABS Take 1 tablet by mouth daily. 60 each 0 Taking    I have reviewed patient's Past Medical Hx, Surgical Hx, Family Hx, Social Hx, medications and allergies.   ROS:  Review of Systems  Constitutional: Negative for chills, fatigue and fever.  Respiratory: Negative for cough.   Cardiovascular: Negative for chest pain.  Gastrointestinal: Negative for abdominal pain, nausea and vomiting.  Musculoskeletal: Negative for myalgias.  Neurological: Negative for headaches.   Other systems negative  Physical Exam  Patient Vitals for the past 24 hrs:  BP Temp Temp src Pulse Resp Height  03/18/16 1027 102/59 98.3 F (36.8 C) Oral 80 18 5\' 6"  (1.676 m)   Constitutional: Well-developed, well-nourished female in no acute distress.  Cardiovascular: normal rate and rhythm Respiratory: normal effort, clear to auscultation bilaterally GI: Abd soft, non-tender, gravid appropriate for gestational age.   No rebound or guarding. MS: Extremities nontender, no edema, normal ROM Neurologic: Alert and oriented x 4.  GU: Neg CVAT.  Dilation: 1 Effacement (%): 70 Station: -2 Exam by:: Wynelle Bourgeois CNM  FHT:  Baseline 135 , moderate variability, accelerations present, no decelerations Contractions: q 2-5 mins Irregular    Labs: Results for orders placed or performed during the hospital encounter of 03/18/16 (from the past 72 hour(s))  Fetal fibronectin     Status: None   Collection Time: 03/18/16 11:05 AM  Result Value Ref Range   Fetal Fibronectin NEGATIVE NEGATIVE    A/POS/-- (08/31 1330)  Imaging:   No results found.  MAU Course/MDM: I have ordered labs and reviewed results.  NST reviewed   She was noted to be contracting, but not feeling them.  I did a fetal fibronectin prior to checking her cervix.   It resulted negative  Treatments in MAU included Procardia, which lessened strength of uterine contractions, appears to be irritability.  .  Fetal heart rate variability was good throughout and there were multiple accelerations   Assessment: SIUP at 2360w6d Preterm uterine irritability with negative Fetal fibronectin Reassuring Category I fetal heart rate tracing  Plan: Discharge home Preterm Labor precautions and fetal kick counts Follow up in Office for prenatal visits and recheck of cervix   Pt stable at time of discharge.  Wynelle BourgeoisMarie Harlea Goetzinger CNM, MSN Certified Nurse-Midwife 03/18/2016 11:12 AM

## 2016-03-18 NOTE — Discharge Instructions (Signed)
Preterm Labor and Birth Information °Pregnancy normally lasts 39-41 weeks. Preterm labor is when labor starts early. It starts before you have been pregnant for 37 whole weeks. °What are the risk factors for preterm labor? °Preterm labor is more likely to occur in women who: °· Have an infection while pregnant. °· Have a cervix that is short. °· Have gone into preterm labor before. °· Have had surgery on their cervix. °· Are younger than age 23. °· Are older than age 35. °· Are African American. °· Are pregnant with two or more babies. °· Take street drugs while pregnant. °· Smoke while pregnant. °· Do not gain enough weight while pregnant. °· Got pregnant right after another pregnancy. °What are the symptoms of preterm labor? °Symptoms of preterm labor include: °· Cramps. The cramps may feel like the cramps some women get during their period. The cramps may happen with watery poop (diarrhea). °· Pain in the belly (abdomen). °· Pain in the lower back. °· Regular contractions or tightening. It may feel like your belly is getting tighter. °· Pressure in the lower belly that seems to get stronger. °· More fluid (discharge) leaking from the vagina. The fluid may be watery or bloody. °· Water breaking. °Why is it important to notice signs of preterm labor? °Babies who are born early may not be fully developed. They have a higher chance for: °· Long-term heart problems. °· Long-term lung problems. °· Trouble controlling body systems, like breathing. °· Bleeding in the brain. °· A condition called cerebral palsy. °· Learning difficulties. °· Death. °These risks are highest for babies who are born before 34 weeks of pregnancy. °How is preterm labor treated? °Treatment depends on: °· How long you were pregnant. °· Your condition. °· The health of your baby. °Treatment may involve: °· Having a stitch (suture) placed in your cervix. When you give birth, your cervix opens so the baby can come out. The stitch keeps the cervix  from opening too soon. °· Staying at the hospital. °· Taking or getting medicines, such as: °¨ Hormone medicines. °¨ Medicines to stop contractions. °¨ Medicines to help the baby’s lungs develop. °¨ Medicines to prevent your baby from having cerebral palsy. °What should I do if I am in preterm labor? °If you think you are going into labor too soon, call your doctor right away. °How can I prevent preterm labor? °· Do not use any tobacco products. °¨ Examples of these are cigarettes, chewing tobacco, and e-cigarettes. °¨ If you need help quitting, ask your doctor. °· Do not use street drugs. °· Do not use any medicines unless you ask your doctor if they are safe for you. °· Talk with your doctor before taking any herbal supplements. °· Make sure you gain enough weight. °· Watch for infection. If you think you might have an infection, get it checked right away. °· If you have gone into preterm labor before, tell your doctor. °This information is not intended to replace advice given to you by your health care provider. Make sure you discuss any questions you have with your health care provider. °Document Released: 05/27/2008 Document Revised: 08/11/2015 Document Reviewed: 07/22/2015 °Elsevier Interactive Patient Education © 2017 Elsevier Inc. ° °

## 2016-03-21 ENCOUNTER — Ambulatory Visit (INDEPENDENT_AMBULATORY_CARE_PROVIDER_SITE_OTHER): Payer: Medicaid Other | Admitting: Obstetrics and Gynecology

## 2016-03-21 ENCOUNTER — Encounter: Payer: Self-pay | Admitting: Family Medicine

## 2016-03-21 VITALS — BP 97/69 | HR 78 | Wt 134.5 lb

## 2016-03-21 DIAGNOSIS — Z23 Encounter for immunization: Secondary | ICD-10-CM | POA: Diagnosis not present

## 2016-03-21 DIAGNOSIS — Z3493 Encounter for supervision of normal pregnancy, unspecified, third trimester: Secondary | ICD-10-CM

## 2016-03-21 NOTE — Progress Notes (Signed)
Subjective:  Sharon Waller is a 21 y.o. G1P0000 at 5945w2d being seen today for ongoing prenatal care.  She is currently monitored for the following issues for this low-risk pregnancy and has Supervision of low-risk pregnancy on her problem list.  Patient reports no complaints.  Contractions: Not present. Vag. Bleeding: None.  Movement: Present. Denies leaking of fluid.   The following portions of the patient's history were reviewed and updated as appropriate: allergies, current medications, past family history, past medical history, past social history, past surgical history and problem list. Problem list updated.  Objective:   Vitals:   03/21/16 1023  BP: 97/69  Pulse: 78  Weight: 134 lb 8 oz (61 kg)    Fetal Status: Fetal Heart Rate (bpm): 128   Movement: Present     General:  Alert, oriented and cooperative. Patient is in no acute distress.  Skin: Skin is warm and dry. No rash noted.   Cardiovascular: Normal heart rate noted  Respiratory: Normal respiratory effort, no problems with respiration noted  Abdomen: Soft, gravid, appropriate for gestational age. Pain/Pressure: Present     Pelvic:  Cervical exam deferred        Extremities: Normal range of motion.  Edema: None  Mental Status: Normal mood and affect. Normal behavior. Normal judgment and thought content.   Urinalysis:      Assessment and Plan:  Pregnancy: G1P0000 at 7945w2d  1. Encounter for supervision of low-risk pregnancy in third trimester Tdap today  Preterm labor symptoms and general obstetric precautions including but not limited to vaginal bleeding, contractions, leaking of fluid and fetal movement were reviewed in detail with the patient. Please refer to After Visit Summary for other counseling recommendations.  Return in about 2 weeks (around 04/04/2016) for OB visit.   Hermina StaggersMichael L Josua Ferrebee, MD

## 2016-03-21 NOTE — Patient Instructions (Signed)
AREA PEDIATRIC/FAMILY PRACTICE PHYSICIANS  [facility] [address], [address] [place], [place]  [address] Phone - [phone]   Fax - [phone]  [facility] [address] [place], [place] [address] Phone - [phone]   Fax - [phone]  [facility] [address] [place], [place]  [address] Phone - [phone]   Fax - [phone]  [facility] [address] [place], [place]  [address] Phone - [phone]   Fax - [phone]  [facility] [address] [place], [place]  [address] Phone - [phone]   Fax - [phone]  [facility] [address] [place], [place]  [address] Phone - [phone]   Fax - [phone]  [facility] [address] [place], [place]  [address] Phone - [phone]   Fax - [phone]  [facility] [address] [place], [place]  [address] Phone - [phone]   Fax - [phone]  [facility] [address] [place], [place]  [address] Phone - [phone]   Fax - [phone] [facility] [address] [place], [place]  [address] Phone - [phone]   Fax - [phone]  [facility] [address] [place], [place]  [address] Phone - [phone]   Fax - [phone]  [facility] [address] [place], [place]  [address] Phone - [phone]   Fax - [phone]  [facility] [address] [place], [place]  [address] Phone - [phone]   Fax - [phone]  [facility] [address] [place], [place]  [address] Phone - [phone]  [facility] [address] [place], [place]  [address] Phone - [phone]   Fax - [phone]  [facility] [address] [place], [place]  [address] Phone - [phone]   Fax - [phone]  [facility] [address] [place], [place]  [address] Phone - [phone]   Fax - [phone]  [facility] [address] [place], [place]  [address] Phone - [phone]   Fax - [phone]   [facility] [address] [place], [place]  [address] Phone - [phone]   Fax - [phone]  [facility] [address] [place], [place]  [address] Phone - [phone]   Fax - [phone]  [facility] [address] [place], [place]  [address] Phone - [phone]   Fax - [phone]  [facility] [address] [place], [place]  [address] Phone - [phone]   Fax - [phone]  [facility] [address] [place], [place]  [address] Phone - [phone]   Fax - [phone] [facility] [address] [place], [place]  [address] Phone - [phone]   Fax - [phone]  [facility] [address] [place], [place]  [address] Phone - [phone]   Fax - [phone]  [facility] [address] [place],   [address] Phone - [phone]   Fax - [phone]   

## 2016-03-21 NOTE — Addendum Note (Signed)
Addended by: Kathee DeltonHILLMAN, Azyah Flett L on: 03/21/2016 10:54 AM   Modules accepted: Orders

## 2016-03-29 ENCOUNTER — Encounter (HOSPITAL_COMMUNITY): Payer: Self-pay | Admitting: *Deleted

## 2016-03-29 ENCOUNTER — Inpatient Hospital Stay (HOSPITAL_COMMUNITY)
Admission: AD | Admit: 2016-03-29 | Discharge: 2016-03-29 | Disposition: A | Discharge status: Home / Self Care | Payer: Medicaid Other | Source: Ambulatory Visit | Attending: Obstetrics and Gynecology | Admitting: Obstetrics and Gynecology

## 2016-03-29 DIAGNOSIS — Z880 Allergy status to penicillin: Secondary | ICD-10-CM | POA: Diagnosis not present

## 2016-03-29 DIAGNOSIS — Z87891 Personal history of nicotine dependence: Secondary | ICD-10-CM | POA: Diagnosis not present

## 2016-03-29 DIAGNOSIS — Z3A33 33 weeks gestation of pregnancy: Secondary | ICD-10-CM | POA: Diagnosis not present

## 2016-03-29 DIAGNOSIS — O4693 Antepartum hemorrhage, unspecified, third trimester: Secondary | ICD-10-CM | POA: Insufficient documentation

## 2016-03-29 LAB — URINALYSIS, ROUTINE W REFLEX MICROSCOPIC
BILIRUBIN URINE: NEGATIVE
Glucose, UA: NEGATIVE mg/dL
KETONES UR: NEGATIVE mg/dL
LEUKOCYTES UA: NEGATIVE
Nitrite: NEGATIVE
PH: 7 (ref 5.0–8.0)
Protein, ur: NEGATIVE mg/dL
Specific Gravity, Urine: 1.006 (ref 1.005–1.030)

## 2016-03-29 LAB — WET PREP, GENITAL
Clue Cells Wet Prep HPF POC: NONE SEEN
Sperm: NONE SEEN
TRICH WET PREP: NONE SEEN
WBC, Wet Prep HPF POC: NONE SEEN
YEAST WET PREP: NONE SEEN

## 2016-03-29 NOTE — H&P (Signed)
Obstetric Resident MAU Note  Chief Complaint:  Vaginal Bleeding   First Provider Initiated Contact with Patient 03/29/16 1212     HPI: Sharon Waller is a 21 y.o. G1P0000 at [redacted]w[redacted]d who presents to maternity admissions reporting bleeding that started this morning after having intercourse. She reports the bleeding was about as much as a light period. No passage of clots. The bleeding has since stopped. She denies any abdominal pain.   Denies contractions, or leakage of fluid. Good fetal movement.   Pregnancy Course: Receives care at [facility]   Patient Active Problem List   Diagnosis Date Noted  . Supervision of low-risk pregnancy 11/12/2015    Past Medical History:  Diagnosis Date  . Asthma   . Bacterial vaginosis   . Bronchitis   . UTI (lower urinary tract infection)     OB History  Gravida Para Term Preterm AB Living  1 0 0 0 0 0  SAB TAB Ectopic Multiple Live Births  0 0 0 0 0    # Outcome Date GA Lbr Len/2nd Weight Sex Delivery Anes PTL Lv  1 Current               Past Surgical History:  Procedure Laterality Date  . NO PAST SURGERIES      Family History: Family History  Problem Relation Age of Onset  . Cancer Maternal Aunt   . Cancer Maternal Grandmother     Social History: Social History  Substance Use Topics  . Smoking status: Former Smoker    Types: Cigarettes  . Smokeless tobacco: Never Used  . Alcohol use No    Allergies:  Allergies  Allergen Reactions  . Augmentin [Amoxicillin-Pot Clavulanate] Rash    Has patient had a PCN reaction causing immediate rash, facial/tongue/throat swelling, SOB or lightheadedness with hypotension: Yes Has patient had a PCN reaction causing severe rash involving mucus membranes or skin necrosis: No Has patient had a PCN reaction that required hospitalization Yes Has patient had a PCN reaction occurring within the last 10 years: No If all of the above answers are "NO", then may proceed with Cephalosporin  use.     Prescriptions Prior to Admission  Medication Sig Dispense Refill Last Dose  . Prenatal Vit-Fe Fumarate-FA (PRENATAL COMPLETE) 14-0.4 MG TABS Take 1 tablet by mouth daily. 60 each 0 Taking    ROS: Pertinent findings in history of present illness.  Physical Exam  Blood pressure 102/61, pulse 85, temperature 98.6 F (37 C), temperature source Oral, resp. rate 16, weight 131 lb 12 oz (59.8 kg), last menstrual period 09/08/2015. CONSTITUTIONAL: Well-developed, well-nourished female in no acute distress.  HENT:  Normocephalic, atraumatic, moist mucus membranes EYES: Conjunctivae normal in appearance. No scleral icterus.  NECK: Normal range of motion, supple SKIN: Skin is warm and dry. No rash noted. Not diaphoretic.  NEUROLGIC: Alert and oriented to person, place, and time.No focal defects PSYCHIATRIC: Normal mood and affect. Normal behavior. Normal judgment and thought content. CARDIOVASCULAR: Normal heart rate noted, regular rhythm RESPIRATORY: Effort and breath sounds normal, LCTAB no problems with respiration noted ABDOMEN: Soft, nontender, nondistended, gravid appropriate for gestational age MUSCULOSKELETAL: Normal range of motion. No edema and no tenderness.  SPECULUM EXAM: NEFG, no vaginal wall lesions present. No bleeding. No discharge. Cervix with bruising present upon the surface.   Dilation: Closed Effacement (%): Thick Exam by:: Dr. Ma Rings (Resident)  FHT:  Baseline 140s , moderate variability, accelerations present, no decelerations Contractions: only an occasional contraction noted  Labs: Results for orders placed or performed during the hospital encounter of 03/29/16 (from the past 24 hour(s))  Urinalysis, Routine w reflex microscopic     Status: Abnormal   Collection Time: 03/29/16 11:40 AM  Result Value Ref Range   Color, Urine STRAW (A) YELLOW   APPearance CLEAR CLEAR   Specific Gravity, Urine 1.006 1.005 - 1.030   pH 7.0 5.0 - 8.0   Glucose, UA  NEGATIVE NEGATIVE mg/dL   Hgb urine dipstick LARGE (A) NEGATIVE   Bilirubin Urine NEGATIVE NEGATIVE   Ketones, ur NEGATIVE NEGATIVE mg/dL   Protein, ur NEGATIVE NEGATIVE mg/dL   Nitrite NEGATIVE NEGATIVE   Leukocytes, UA NEGATIVE NEGATIVE   RBC / HPF 0-5 0 - 5 RBC/hpf   WBC, UA 0-5 0 - 5 WBC/hpf   Bacteria, UA RARE (A) NONE SEEN   Squamous Epithelial / LPF 0-5 (A) NONE SEEN   Mucous PRESENT   Wet prep, genital     Status: None   Collection Time: 03/29/16 12:15 PM  Result Value Ref Range   Yeast Wet Prep HPF POC NONE SEEN NONE SEEN   Trich, Wet Prep NONE SEEN NONE SEEN   Clue Cells Wet Prep HPF POC NONE SEEN NONE SEEN   WBC, Wet Prep HPF POC NONE SEEN NONE SEEN   Sperm NONE SEEN     Imaging:  No results found.  MAU Course: Performed sterile speculum exam to evaluate for vaginal bleeding. No blood was present within the vaginal vault. No vaginal discharge was present. Wet prep and GC/Chlamydia were obtained. Wet prep negative for trichomonas. GC/Chlamydia will be followed per protocol.    Assessment: 1. Vaginal bleeding in pregnancy, third trimester     Plan: Discharge home Advised bleeding likely secondary to intercourse.  Labor precautions and fetal kick counts reviewed Follow up with OB provider    Allergies as of 03/29/2016      Reactions   Augmentin [amoxicillin-pot Clavulanate] Rash   Has patient had a PCN reaction causing immediate rash, facial/tongue/throat swelling, SOB or lightheadedness with hypotension: Yes Has patient had a PCN reaction causing severe rash involving mucus membranes or skin necrosis: No Has patient had a PCN reaction that required hospitalization Yes Has patient had a PCN reaction occurring within the last 10 years: No If all of the above answers are "NO", then may proceed with Cephalosporin use.      Medication List    TAKE these medications   PRENATAL COMPLETE 14-0.4 MG Tabs Take 1 tablet by mouth daily.       Lise AuerMegan C Campbell,  MD PGY-2 03/29/2016 12:45 PM

## 2016-03-29 NOTE — MAU Note (Signed)
Was having intercourse and started bleeding.  Looked like a lot, never happened before.  Denies pain. Denies hx of low lying or previa.

## 2016-03-29 NOTE — Discharge Instructions (Signed)
Vaginal Bleeding During Pregnancy, Third Trimester °A small amount of bleeding (spotting) from the vagina is common in pregnancy. Sometimes the bleeding is normal and is not a problem, and sometimes it is a sign of something serious. Be sure to tell your doctor about any bleeding from your vagina right away. °Follow these instructions at home: °· Watch your condition for any changes. °· Follow your doctor's instructions about how active you can be. °· If you are on bed rest: °¨ You may need to stay in bed and only get up to use the bathroom. °¨ You may be allowed to do some activities. °¨ If you need help, make plans for someone to help you. °· Write down: °¨ The number of pads you use each day. °¨ How often you change pads. °¨ How soaked (saturated) your pads are. °· Do not use tampons. °· Do not douche. °· Do not have sex or orgasms until your doctor says it is okay. °· Follow your doctor's advice about lifting, driving, and doing physical activities. °· If you pass any tissue from your vagina, save the tissue so you can show it to your doctor. °· Only take medicines as told by your doctor. °· Do not take aspirin because it can make you bleed. °· Keep all follow-up visits as told by your doctor. °Contact a doctor if: °· You bleed from your vagina. °· You have cramps. °· You have labor pains. °· You have a fever that does not go away after you take medicine. °Get help right away if: °· You have very bad cramps in your back or belly (abdomen). °· You have chills. °· You have a gush of fluid from your vagina. °· You pass large clots or tissue from your vagina. °· You bleed more. °· You feel light-headed or weak. °· You pass out (faint). °· You do not feel your baby move around as much as before. °This information is not intended to replace advice given to you by your health care provider. Make sure you discuss any questions you have with your health care provider. °Document Released: 07/15/2013 Document Revised:  08/06/2015 Document Reviewed: 11/05/2012 °Elsevier Interactive Patient Education © 2017 Elsevier Inc. ° °

## 2016-03-31 LAB — GC/CHLAMYDIA PROBE AMP (~~LOC~~) NOT AT ARMC
Chlamydia: NEGATIVE
NEISSERIA GONORRHEA: NEGATIVE

## 2016-04-04 ENCOUNTER — Ambulatory Visit (INDEPENDENT_AMBULATORY_CARE_PROVIDER_SITE_OTHER): Payer: Medicaid Other | Admitting: Obstetrics & Gynecology

## 2016-04-04 DIAGNOSIS — Z3490 Encounter for supervision of normal pregnancy, unspecified, unspecified trimester: Secondary | ICD-10-CM

## 2016-04-04 DIAGNOSIS — Z3493 Encounter for supervision of normal pregnancy, unspecified, third trimester: Secondary | ICD-10-CM

## 2016-04-04 NOTE — Progress Notes (Signed)
   PRENATAL VISIT NOTE  Subjective:  Sharon Waller is a 21 y.o. G1P0000 at 7067w2d being seen today for ongoing prenatal care.  She is currently monitored for the following issues for this low-risk pregnancy and has Supervision of low-risk pregnancy on her problem list.  Patient reports no complaints.  Contractions: Not present. Vag. Bleeding: None.  Movement: Present. Denies leaking of fluid.   The following portions of the patient's history were reviewed and updated as appropriate: allergies, current medications, past family history, past medical history, past social history, past surgical history and problem list. Problem list updated.  Objective:   Vitals:   04/04/16 0958  BP: 108/61  Pulse: 77  Weight: 132 lb (59.9 kg)    Fetal Status: Fetal Heart Rate (bpm): 140   Movement: Present     General:  Alert, oriented and cooperative. Patient is in no acute distress.  Skin: Skin is warm and dry. No rash noted.   Cardiovascular: Normal heart rate noted  Respiratory: Normal respiratory effort, no problems with respiration noted  Abdomen: Soft, gravid, appropriate for gestational age. Pain/Pressure: Absent     Pelvic:  Cervical exam deferred        Extremities: Normal range of motion.  Edema: None  Mental Status: Normal mood and affect. Normal behavior. Normal judgment and thought content.   Assessment and Plan:  Pregnancy: G1P0000 at 5967w2d  1. Encounter for supervision of low-risk pregnancy, antepartum -CP cyst resolved.  Size equals dates.  Needs to pick peds.  Needs pap smear postpartum  Preterm labor symptoms and general obstetric precautions including but not limited to vaginal bleeding, contractions, leaking of fluid and fetal movement were reviewed in detail with the patient. Please refer to After Visit Summary for other counseling recommendations.  Return in 2 weeks (on 04/18/2016).   Lesly DukesKelly H Jaycion Treml, MD

## 2016-04-18 ENCOUNTER — Other Ambulatory Visit (HOSPITAL_COMMUNITY)
Admission: RE | Admit: 2016-04-18 | Discharge: 2016-04-18 | Disposition: A | Discharge status: Home / Self Care | Payer: Medicaid Other | Source: Ambulatory Visit | Attending: Student | Admitting: Student

## 2016-04-18 ENCOUNTER — Ambulatory Visit (INDEPENDENT_AMBULATORY_CARE_PROVIDER_SITE_OTHER): Payer: Medicaid Other | Admitting: Student

## 2016-04-18 VITALS — BP 103/63 | HR 74 | Wt 134.6 lb

## 2016-04-18 DIAGNOSIS — Z113 Encounter for screening for infections with a predominantly sexual mode of transmission: Secondary | ICD-10-CM | POA: Diagnosis not present

## 2016-04-18 DIAGNOSIS — O368130 Decreased fetal movements, third trimester, not applicable or unspecified: Secondary | ICD-10-CM

## 2016-04-18 DIAGNOSIS — Z3403 Encounter for supervision of normal first pregnancy, third trimester: Secondary | ICD-10-CM | POA: Diagnosis present

## 2016-04-18 DIAGNOSIS — Z3490 Encounter for supervision of normal pregnancy, unspecified, unspecified trimester: Secondary | ICD-10-CM

## 2016-04-18 DIAGNOSIS — Z3400 Encounter for supervision of normal first pregnancy, unspecified trimester: Secondary | ICD-10-CM

## 2016-04-18 LAB — OB RESULTS CONSOLE GC/CHLAMYDIA: Gonorrhea: NEGATIVE

## 2016-04-18 LAB — OB RESULTS CONSOLE GBS: STREP GROUP B AG: NEGATIVE

## 2016-04-18 NOTE — Progress Notes (Signed)
   PRENATAL VISIT NOTE  Subjective:  Sharon Waller is a 21 y.o. G1P0000 at 2519w2d being seen today for ongoing prenatal care.  She is currently monitored for the following issues for this low-risk pregnancy and has Supervision of low-risk pregnancy on her problem list.  Patient reports no complaints and says she is not feeling the baby like normal, only smaller movements now and fewer kicks and turns. .  Contractions: Not present. Vag. Bleeding: None.  Movement: Present. Denies leaking of fluid.   The following portions of the patient's history were reviewed and updated as appropriate: allergies, current medications, past family history, past medical history, past social history, past surgical history and problem list. Problem list updated.  Objective:   Vitals:   04/18/16 0950  BP: 103/63  Pulse: 74  Weight: 134 lb 9.6 oz (61.1 kg)    Fetal Status:     Movement: Present     General:  Alert, oriented and cooperative. Patient is in no acute distress.  Skin: Skin is warm and dry. No rash noted.   Cardiovascular: Normal heart rate noted  Respiratory: Normal respiratory effort, no problems with respiration noted  Abdomen: Soft, gravid, appropriate for gestational age. Pain/Pressure: Present     Pelvic:  Cervical exam deferred        Extremities: Normal range of motion.  Edema: None  Mental Status: Normal mood and affect. Normal behavior. Normal judgment and thought content.   Assessment and Plan:  Pregnancy: G1P0000 at 3919w2d  1. Supervision of normal first pregnancy, antepartum  - Culture, beta strep (group b only) - GC/Chlamydia Probe Amp - Fetal nonstress test  2. Decreased fetal movements in third trimester, single or unspecified fetus  - Fetal nonstress test -NST reactive today; baseline 120 with accelerations, no decelerations, moderate variability and occasional contraction.  -Reviewed kick counts with patient; encouraged patient to return to the MAU if she felt like  baby wasn't moving like normal  Term labor symptoms and general obstetric precautions including but not limited to vaginal bleeding, contractions, leaking of fluid and fetal movement were reviewed in detail with the patient. Please refer to After Visit Summary for other counseling recommendations.  Return in about 1 week (around 04/25/2016) for Ob fu.   Marylene LandKathryn Lorraine Odile Veloso, CNM

## 2016-04-18 NOTE — Patient Instructions (Signed)
Introduction Patient Name: ________________________________________________ Patient Due Date: ____________________ What is a fetal movement count? A fetal movement count is the number of times that you feel your baby move during a certain amount of time. This may also be called a fetal kick count. A fetal movement count is recommended for every pregnant woman. You may be asked to start counting fetal movements as early as week 28 of your pregnancy. Pay attention to when your baby is most active. You may notice your baby's sleep and wake cycles. You may also notice things that make your baby move more. You should do a fetal movement count:  When your baby is normally most active.  At the same time each day. A good time to count movements is while you are resting, after having something to eat and drink. How do I count fetal movements? 1. Find a quiet, comfortable area. Sit, or lie down on your side. 2. Write down the date, the start time and stop time, and the number of movements that you felt between those two times. Take this information with you to your health care visits. 3. For 2 hours, count kicks, flutters, swishes, rolls, and jabs. You should feel at least 10 movements during 2 hours. 4. You may stop counting after you have felt 10 movements. 5. If you do not feel 10 movements in 2 hours, have something to eat and drink. Then, keep resting and counting for 1 hour. If you feel at least 4 movements during that hour, you may stop counting. Contact a health care provider if:  You feel fewer than 4 movements in 2 hours.  Your baby is not moving like he or she usually does. Date: ____________ Start time: ____________ Stop time: ____________ Movements: ____________ Date: ____________ Start time: ____________ Stop time: ____________ Movements: ____________ Date: ____________ Start time: ____________ Stop time: ____________ Movements: ____________ Date: ____________ Start time: ____________  Stop time: ____________ Movements: ____________ Date: ____________ Start time: ____________ Stop time: ____________ Movements: ____________ Date: ____________ Start time: ____________ Stop time: ____________ Movements: ____________ Date: ____________ Start time: ____________ Stop time: ____________ Movements: ____________ Date: ____________ Start time: ____________ Stop time: ____________ Movements: ____________ Date: ____________ Start time: ____________ Stop time: ____________ Movements: ____________ This information is not intended to replace advice given to you by your health care provider. Make sure you discuss any questions you have with your health care provider. Document Released: 03/30/2006 Document Revised: 10/28/2015 Document Reviewed: 04/09/2015 Elsevier Interactive Patient Education  2017 Elsevier Inc.  

## 2016-04-18 NOTE — Addendum Note (Signed)
Addended by: Kathee DeltonHILLMAN, Jakita Dutkiewicz L on: 04/18/2016 04:32 PM   Modules accepted: Orders

## 2016-04-18 NOTE — Progress Notes (Signed)
Pt reports some back pain.

## 2016-04-19 LAB — GC/CHLAMYDIA PROBE AMP (~~LOC~~) NOT AT ARMC
CHLAMYDIA, DNA PROBE: NEGATIVE
Neisseria Gonorrhea: NEGATIVE

## 2016-04-21 LAB — CULTURE, BETA STREP (GROUP B ONLY)

## 2016-04-28 ENCOUNTER — Ambulatory Visit (INDEPENDENT_AMBULATORY_CARE_PROVIDER_SITE_OTHER): Payer: Medicaid Other | Admitting: Student

## 2016-04-28 DIAGNOSIS — Z3493 Encounter for supervision of normal pregnancy, unspecified, third trimester: Secondary | ICD-10-CM

## 2016-04-28 NOTE — Progress Notes (Signed)
   PRENATAL VISIT NOTE  Subjective:  Sharon Waller is a 21 y.o. G1P0000 at 7879w5d being seen today for ongoing prenatal care.  She is currently monitored for the following issues for this low-risk pregnancy and has Supervision of low-risk pregnancy on her problem list.  Patient reports no complaints and Still taking her [employer] pills. .  Contractions: Irregular. Vag. Bleeding: None.  Movement: Present. Denies leaking of fluid.   The following portions of the patient's history were reviewed and updated as appropriate: allergies, current medications, past family history, past medical history, past social history, past surgical history and problem list. Problem list updated.  Objective:   Vitals:   04/28/16 1059  BP: 115/68  Pulse: 77  Weight: 60.1 kg (132 lb 6.4 oz)    Fetal Status: Fetal Heart Rate (bpm): 132 Fundal Height: 37 cm Movement: Present     General:  Alert, oriented and cooperative. Patient is in no acute distress.  Skin: Skin is warm and dry. No rash noted.   Cardiovascular: Normal heart rate noted  Respiratory: Normal respiratory effort, no problems with respiration noted  Abdomen: Soft, gravid, appropriate for gestational age. Pain/Pressure: Present     Pelvic:  Cervical exam deferred        Extremities: Normal range of motion.  Edema: None  Mental Status: Normal mood and affect. Normal behavior. Normal judgment and thought content.   Assessment and Plan:  Pregnancy: G1P0000 at 479w5d  1. Encounter for supervision of low-risk pregnancy in third trimester   Term labor symptoms and general obstetric precautions including but not limited to vaginal bleeding, contractions, leaking of fluid and fetal movement were reviewed in detail with the patient. Please refer to After Visit Summary for other counseling recommendations.  Return in about 1 week (around 05/05/2016) for ROB.   Marylene LandKathryn Lorraine Orville Widmann, CNM

## 2016-04-28 NOTE — Patient Instructions (Signed)

## 2016-05-02 ENCOUNTER — Inpatient Hospital Stay (HOSPITAL_COMMUNITY)
Admission: AD | Admit: 2016-05-02 | Discharge: 2016-05-02 | Disposition: A | Discharge status: Home / Self Care | Payer: Medicaid Other | Source: Ambulatory Visit | Attending: Obstetrics & Gynecology | Admitting: Obstetrics & Gynecology

## 2016-05-02 ENCOUNTER — Encounter (HOSPITAL_COMMUNITY): Payer: Self-pay | Admitting: *Deleted

## 2016-05-02 DIAGNOSIS — Z3A38 38 weeks gestation of pregnancy: Secondary | ICD-10-CM | POA: Diagnosis not present

## 2016-05-02 DIAGNOSIS — O471 False labor at or after 37 completed weeks of gestation: Secondary | ICD-10-CM | POA: Insufficient documentation

## 2016-05-02 NOTE — Discharge Instructions (Signed)
Braxton Hicks Contractions °Contractions of the uterus can occur throughout pregnancy. Contractions are not always a sign that you are in labor.  °WHAT ARE BRAXTON HICKS CONTRACTIONS?  °Contractions that occur before labor are called Braxton Hicks contractions, or false labor. Toward the end of pregnancy (32-34 weeks), these contractions can develop more often and may become more forceful. This is not true labor because these contractions do not result in opening (dilatation) and thinning of the cervix. They are sometimes difficult to tell apart from true labor because these contractions can be forceful and people have different pain tolerances. You should not feel embarrassed if you go to the hospital with false labor. Sometimes, the only way to tell if you are in true labor is for your health care provider to look for changes in the cervix. °If there are no prenatal problems or other health problems associated with the pregnancy, it is completely safe to be sent home with false labor and await the onset of true labor. °HOW CAN YOU TELL THE DIFFERENCE BETWEEN TRUE AND FALSE LABOR? °False Labor  °· The contractions of false labor are usually shorter and not as hard as those of true labor.   °· The contractions are usually irregular.   °· The contractions are often felt in the front of the lower abdomen and in the groin.   °· The contractions may go away when you walk around or change positions while lying down.   °· The contractions get weaker and are shorter lasting as time goes on.   °· The contractions do not usually become progressively stronger, regular, and closer together as with true labor.   °True Labor  °· Contractions in true labor last 30-70 seconds, become very regular, usually become more intense, and increase in frequency.   °· The contractions do not go away with walking.   °· The discomfort is usually felt in the top of the uterus and spreads to the lower abdomen and low back.   °· True labor can be  determined by your health care provider with an exam. This will show that the cervix is dilating and getting thinner.   °WHAT TO REMEMBER °· Keep up with your usual exercises and follow other instructions given by your health care provider.   °· Take medicines as directed by your health care provider.   °· Keep your regular prenatal appointments.   °· Eat and drink lightly if you think you are going into labor.   °· If Braxton Hicks contractions are making you uncomfortable:   °¨ Change your position from lying down or resting to walking, or from walking to resting.   °¨ Sit and rest in a tub of warm water.   °¨ Drink 2-3 glasses of water. Dehydration may cause these contractions.   °¨ Do slow and deep breathing several times an hour.   °WHEN SHOULD I SEEK IMMEDIATE MEDICAL CARE? °Seek immediate medical care if: °· Your contractions become stronger, more regular, and closer together.   °· You have fluid leaking or gushing from your vagina.   °· You have a fever.   °· You pass blood-tinged mucus.   °· You have vaginal bleeding.   °· You have continuous abdominal pain.   °· You have low back pain that you never had before.   °· You feel your baby's head pushing down and causing pelvic pressure.   °· Your baby is not moving as much as it used to.   °This information is not intended to replace advice given to you by your health care provider. Make sure you discuss any questions you have with your health care   provider. °Document Released: 02/28/2005 Document Revised: 06/22/2015 Document Reviewed: 12/10/2012 °Elsevier Interactive Patient Education © 2017 Elsevier Inc. ° °

## 2016-05-03 ENCOUNTER — Inpatient Hospital Stay (HOSPITAL_COMMUNITY)
Admission: AD | Admit: 2016-05-03 | Discharge: 2016-05-06 | DRG: 775 | Disposition: A | Discharge status: Home / Self Care | Payer: Medicaid Other | Source: Ambulatory Visit | Attending: Family Medicine | Admitting: Family Medicine

## 2016-05-03 ENCOUNTER — Encounter (HOSPITAL_COMMUNITY): Payer: Self-pay | Admitting: *Deleted

## 2016-05-03 DIAGNOSIS — Z87891 Personal history of nicotine dependence: Secondary | ICD-10-CM

## 2016-05-03 DIAGNOSIS — Z3A38 38 weeks gestation of pregnancy: Secondary | ICD-10-CM

## 2016-05-03 MED ORDER — LACTATED RINGERS IV SOLN
INTRAVENOUS | Status: DC
  Administered 2016-05-03 – 2016-05-04 (×2): via INTRAVENOUS

## 2016-05-03 MED ORDER — FENTANYL CITRATE (PF) 100 MCG/2ML IJ SOLN: 100.0000 ug | INTRAMUSCULAR | Status: DC | PRN

## 2016-05-03 NOTE — Progress Notes (Signed)
Orders to recheck in an hour.

## 2016-05-03 NOTE — MAU Note (Signed)
PT  SAYS UC  HURT  BAD  SINCE  930PM.      PNC-  YESTERDAY - 2-3   CM.       DENIES HSV AND MRSA.   GBS- NEG

## 2016-05-04 ENCOUNTER — Encounter: Payer: Medicaid Other | Admitting: Student

## 2016-05-04 ENCOUNTER — Encounter (HOSPITAL_COMMUNITY): Payer: Self-pay

## 2016-05-04 ENCOUNTER — Inpatient Hospital Stay (HOSPITAL_COMMUNITY): Payer: Medicaid Other | Admitting: Anesthesiology

## 2016-05-04 DIAGNOSIS — Z87891 Personal history of nicotine dependence: Secondary | ICD-10-CM | POA: Diagnosis not present

## 2016-05-04 DIAGNOSIS — Z3493 Encounter for supervision of normal pregnancy, unspecified, third trimester: Secondary | ICD-10-CM | POA: Diagnosis present

## 2016-05-04 DIAGNOSIS — Z3A38 38 weeks gestation of pregnancy: Secondary | ICD-10-CM | POA: Diagnosis not present

## 2016-05-04 LAB — CBC
HCT: 33.3 % — ABNORMAL LOW (ref 36.0–46.0)
Hemoglobin: 11.5 g/dL — ABNORMAL LOW (ref 12.0–15.0)
MCH: 31.4 pg (ref 26.0–34.0)
MCHC: 34.5 g/dL (ref 30.0–36.0)
MCV: 91 fL (ref 78.0–100.0)
PLATELETS: 262 10*3/uL (ref 150–400)
RBC: 3.66 MIL/uL — ABNORMAL LOW (ref 3.87–5.11)
RDW: 14.8 % (ref 11.5–15.5)
WBC: 10.9 10*3/uL — AB (ref 4.0–10.5)

## 2016-05-04 LAB — ABO/RH: ABO/RH(D): A POS

## 2016-05-04 LAB — TYPE AND SCREEN
ABO/RH(D): A POS
Antibody Screen: NEGATIVE

## 2016-05-04 MED ORDER — FENTANYL CITRATE (PF) 100 MCG/2ML IJ SOLN: 100.0000 ug | INTRAMUSCULAR | Status: DC | PRN

## 2016-05-04 MED ORDER — EPHEDRINE 5 MG/ML INJ
10.0000 mg | INTRAVENOUS | Status: DC | PRN
  Filled 2016-05-04: qty 4

## 2016-05-04 MED ORDER — ACETAMINOPHEN 325 MG PO TABS: 650.0000 mg | ORAL_TABLET | ORAL | Status: DC | PRN

## 2016-05-04 MED ORDER — PHENYLEPHRINE 40 MCG/ML (10ML) SYRINGE FOR IV PUSH (FOR BLOOD PRESSURE SUPPORT)
80.0000 ug | PREFILLED_SYRINGE | INTRAVENOUS | Status: DC | PRN
  Filled 2016-05-04: qty 5

## 2016-05-04 MED ORDER — OXYTOCIN BOLUS FROM INFUSION
500.0000 mL | Freq: Once | INTRAVENOUS | Status: AC
  Administered 2016-05-04: 500 mL via INTRAVENOUS

## 2016-05-04 MED ORDER — PHENYLEPHRINE 40 MCG/ML (10ML) SYRINGE FOR IV PUSH (FOR BLOOD PRESSURE SUPPORT)
PREFILLED_SYRINGE | INTRAVENOUS | Status: AC
  Filled 2016-05-04: qty 20

## 2016-05-04 MED ORDER — DIPHENHYDRAMINE HCL 25 MG PO CAPS: 25.0000 mg | ORAL_CAPSULE | Freq: Four times a day (QID) | ORAL | Status: DC | PRN

## 2016-05-04 MED ORDER — ONDANSETRON HCL 4 MG/2ML IJ SOLN: 4.0000 mg | Freq: Four times a day (QID) | INTRAMUSCULAR | Status: DC | PRN

## 2016-05-04 MED ORDER — IBUPROFEN 600 MG PO TABS
600.0000 mg | ORAL_TABLET | Freq: Four times a day (QID) | ORAL | Status: DC
  Administered 2016-05-04 – 2016-05-06 (×5): 600 mg via ORAL
  Filled 2016-05-04 (×8): qty 1

## 2016-05-04 MED ORDER — PHENYLEPHRINE 40 MCG/ML (10ML) SYRINGE FOR IV PUSH (FOR BLOOD PRESSURE SUPPORT): 80.0000 ug | PREFILLED_SYRINGE | INTRAVENOUS | Status: DC | PRN

## 2016-05-04 MED ORDER — LACTATED RINGERS IV SOLN: 500.0000 mL | Freq: Once | INTRAVENOUS | Status: DC

## 2016-05-04 MED ORDER — FENTANYL 2.5 MCG/ML BUPIVACAINE 1/10 % EPIDURAL INFUSION (WH - ANES): 14.0000 mL/h | INTRAMUSCULAR | Status: DC | PRN

## 2016-05-04 MED ORDER — SOD CITRATE-CITRIC ACID 500-334 MG/5ML PO SOLN: 30.0000 mL | ORAL | Status: DC | PRN

## 2016-05-04 MED ORDER — EPHEDRINE 5 MG/ML INJ: 10.0000 mg | INTRAVENOUS | Status: DC | PRN

## 2016-05-04 MED ORDER — OXYTOCIN 40 UNITS IN LACTATED RINGERS INFUSION - SIMPLE MED
2.5000 [IU]/h | INTRAVENOUS | Status: DC
  Administered 2016-05-04: 2.5 [IU]/h via INTRAVENOUS
  Filled 2016-05-04: qty 1000

## 2016-05-04 MED ORDER — LACTATED RINGERS IV SOLN
500.0000 mL | INTRAVENOUS | Status: DC | PRN
  Administered 2016-05-04: 500 mL via INTRAVENOUS

## 2016-05-04 MED ORDER — SENNOSIDES-DOCUSATE SODIUM 8.6-50 MG PO TABS
2.0000 | ORAL_TABLET | ORAL | Status: DC
  Administered 2016-05-05: 2 via ORAL
  Filled 2016-05-04 (×2): qty 2

## 2016-05-04 MED ORDER — OXYCODONE-ACETAMINOPHEN 5-325 MG PO TABS: 2.0000 | ORAL_TABLET | ORAL | Status: DC | PRN

## 2016-05-04 MED ORDER — FENTANYL 2.5 MCG/ML BUPIVACAINE 1/10 % EPIDURAL INFUSION (WH - ANES)
14.0000 mL/h | INTRAMUSCULAR | Status: DC | PRN
  Administered 2016-05-04: 14 mL/h via EPIDURAL

## 2016-05-04 MED ORDER — ONDANSETRON HCL 4 MG/2ML IJ SOLN: 4.0000 mg | INTRAMUSCULAR | Status: DC | PRN

## 2016-05-04 MED ORDER — BENZOCAINE-MENTHOL 20-0.5 % EX AERO
1.0000 "application " | INHALATION_SPRAY | CUTANEOUS | Status: DC | PRN
  Filled 2016-05-04: qty 56

## 2016-05-04 MED ORDER — LIDOCAINE HCL (PF) 1 % IJ SOLN
INTRAMUSCULAR | Status: DC | PRN
  Administered 2016-05-04: 5 mL via EPIDURAL

## 2016-05-04 MED ORDER — DIPHENHYDRAMINE HCL 50 MG/ML IJ SOLN: 12.5000 mg | INTRAMUSCULAR | Status: DC | PRN

## 2016-05-04 MED ORDER — SIMETHICONE 80 MG PO CHEW: 80.0000 mg | CHEWABLE_TABLET | ORAL | Status: DC | PRN

## 2016-05-04 MED ORDER — ONDANSETRON HCL 4 MG PO TABS
4.0000 mg | ORAL_TABLET | ORAL | Status: DC | PRN
Start: 2016-05-04 — End: 2016-05-06

## 2016-05-04 MED ORDER — LIDOCAINE HCL (PF) 1 % IJ SOLN
30.0000 mL | INTRAMUSCULAR | Status: DC | PRN
  Filled 2016-05-04: qty 30

## 2016-05-04 MED ORDER — OXYCODONE-ACETAMINOPHEN 5-325 MG PO TABS: 1.0000 | ORAL_TABLET | ORAL | Status: DC | PRN

## 2016-05-04 MED ORDER — DIBUCAINE 1 % RE OINT: 1.0000 "application " | TOPICAL_OINTMENT | RECTAL | Status: DC | PRN

## 2016-05-04 MED ORDER — COCONUT OIL OIL: 1.0000 "application " | TOPICAL_OIL | Status: DC | PRN

## 2016-05-04 MED ORDER — FENTANYL 2.5 MCG/ML BUPIVACAINE 1/10 % EPIDURAL INFUSION (WH - ANES)
INTRAMUSCULAR | Status: AC
  Filled 2016-05-04: qty 100

## 2016-05-04 MED ORDER — PRENATAL MULTIVITAMIN CH
1.0000 | ORAL_TABLET | Freq: Every day | ORAL | Status: DC
  Administered 2016-05-05: 1 via ORAL
  Filled 2016-05-04: qty 1

## 2016-05-04 MED ORDER — TETANUS-DIPHTH-ACELL PERTUSSIS 5-2.5-18.5 LF-MCG/0.5 IM SUSP: 0.5000 mL | Freq: Once | INTRAMUSCULAR | Status: DC

## 2016-05-04 MED ORDER — ZOLPIDEM TARTRATE 5 MG PO TABS: 5.0000 mg | ORAL_TABLET | Freq: Every evening | ORAL | Status: DC | PRN

## 2016-05-04 MED ORDER — WITCH HAZEL-GLYCERIN EX PADS: 1.0000 "application " | MEDICATED_PAD | CUTANEOUS | Status: DC | PRN

## 2016-05-04 NOTE — Progress Notes (Signed)
Patient told how to use callbell for emergency and help.  Patient told about color changed in infant and when to call for help. Patient told not to get up and told about plan of care. Patient told not to fall asleep with infant.

## 2016-05-04 NOTE — Anesthesia Preprocedure Evaluation (Signed)
Anesthesia Evaluation  Patient identified by MRN, date of birth, ID band Patient awake    Reviewed: Allergy & Precautions, H&P , NPO status , Patient's Chart, lab work & pertinent test results  Airway Mallampati: II   Neck ROM: full    Dental   Pulmonary former smoker,    breath sounds clear to auscultation       Cardiovascular negative cardio ROS   Rhythm:regular Rate:Normal     Neuro/Psych    GI/Hepatic   Endo/Other    Renal/GU      Musculoskeletal   Abdominal   Peds  Hematology   Anesthesia Other Findings   Reproductive/Obstetrics                             Anesthesia Physical Anesthesia Plan  ASA: II  Anesthesia Plan: Epidural   Post-op Pain Management:    Induction: Intravenous  Airway Management Planned: Natural Airway  Additional Equipment:   Intra-op Plan:   Post-operative Plan:   Informed Consent: I have reviewed the patients History and Physical, chart, labs and discussed the procedure including the risks, benefits and alternatives for the proposed anesthesia with the patient or authorized representative who has indicated his/her understanding and acceptance.     Plan Discussed with: Anesthesiologist  Anesthesia Plan Comments:         Anesthesia Quick Evaluation

## 2016-05-04 NOTE — Anesthesia Postprocedure Evaluation (Signed)
Anesthesia Post Note  Patient: Sharon Waller  Procedure(s) Performed: * No procedures listed *  Patient location during evaluation: Mother Baby Anesthesia Type: Epidural Level of consciousness: awake Pain management: pain level controlled Vital Signs Assessment: post-procedure vital signs reviewed and stable Respiratory status: spontaneous breathing Cardiovascular status: stable Postop Assessment: no headache, no backache, epidural receding and patient able to bend at knees Anesthetic complications: no        Last Vitals:  Vitals:   05/04/16 0715 05/04/16 0750  BP: 105/63 112/68  Pulse: 71 70  Resp:  18  Temp:  36.8 C    Last Pain:  Vitals:   05/04/16 0750  TempSrc: Oral  PainSc:    Pain Goal:                 [name],[name]

## 2016-05-04 NOTE — H&P (Signed)
LABOR AND DELIVERY ADMISSION HISTORY AND PHYSICAL NOTE  Sharon Waller is a 21 y.o. female G1P0000 with IUP at 5245w4d by 6 wk Ultrasound presenting for SOL this morning that has intensified through the day. She has no complications in this pregnancy and has been doing well.   She reports positive fetal movement. She denies leakage of fluid or vaginal bleeding.  Prenatal History/Complications:  Past Medical History: Past Medical History:  Diagnosis Date  . Asthma   . Bacterial vaginosis   . Bronchitis   . UTI (lower urinary tract infection)     Past Surgical History: Past Surgical History:  Procedure Laterality Date  . NO PAST SURGERIES      Obstetrical History: OB History    Gravida Para Term Preterm AB Living   1 0 0 0 0 0   SAB TAB Ectopic Multiple Live Births   0 0 0 0 0      Social History: Social History   Social History  . Marital status: Single    Spouse name: N/A  . Number of children: N/A  . Years of education: N/A   Social History Main Topics  . Smoking status: Former Smoker    Types: Cigarettes    Quit date: 09/30/2015  . Smokeless tobacco: Never Used  . Alcohol use No  . Drug use: No  . Sexual activity: Not Currently     Comment: last sex 19 Feb 18   Other Topics Concern  . None   Social History Narrative  . None    Family History: Family History  Problem Relation Age of Onset  . Cancer Maternal Aunt   . Cancer Maternal Grandmother     Allergies: Allergies  Allergen Reactions  . Augmentin [Amoxicillin-Pot Clavulanate] Rash    Has patient had a PCN reaction causing immediate rash, facial/tongue/throat swelling, SOB or lightheadedness with hypotension: Yes Has patient had a PCN reaction causing severe rash involving mucus membranes or skin necrosis: No Has patient had a PCN reaction that required hospitalization Yes Has patient had a PCN reaction occurring within the last 10 years: No If all of the above answers are "NO", then may  proceed with Cephalosporin use.     Prescriptions Prior to Admission  Medication Sig Dispense Refill Last Dose  . ferrous sulfate 325 (65 FE) MG EC tablet Take 325 mg by mouth daily with breakfast.   05/02/2016 at Unknown time  . Prenatal Vit-Fe Fumarate-FA (PRENATAL COMPLETE) 14-0.4 MG TABS Take 1 tablet by mouth daily. 60 each 0 05/03/2016 at Unknown time     Review of Systems   All systems reviewed and negative except as stated in HPI  Blood pressure 107/59, pulse 76, temperature 97.9 F (36.6 C), temperature source Oral, resp. rate 18, height 5\' 6"  (1.676 m), weight 133 lb (60.3 kg), last menstrual period 09/08/2015. General appearance: alert, cooperative and appears stated age Lungs: no respiratory distress, no audible wheezing Heart: regular rate and rhythm Abdomen: soft, non-tender; bowel sounds normal Extremities: No calf swelling or tenderness Presentation: cephalic by nursing exam Fetal monitoring: category 1 Uterine activity: contractions every 2-3 minutes Dilation: 5.5 Effacement (%): 90 Station: -2 Exam by:: Ciscomber Stovall RN   Prenatal labs: ABO, Rh: A/POS/-- (08/31 1330) Antibody: NEG (08/31 1330) Rubella: immune RPR: NON REAC (12/11 1046)  HBsAg: NEGATIVE (08/31 1330)  HIV: NONREACTIVE (12/11 1046)  GBS: Negative (02/05 0000)  1 hr Glucola: negative Genetic screening:  Quad negative Anatomy US: none  Prenatal Transfer Tool  Maternal Diabetes: No Genetic Screening: Normal Maternal Ultrasounds/Referrals: Normal Fetal Ultrasounds or other Referrals:  None Maternal Substance Abuse:  No Significant Maternal Medications:  None Significant Maternal Lab Results: Lab values include: Group B Strep negative  Results for orders placed or performed during the hospital encounter of 05/03/16 (from the past 24 hour(s))  CBC   Collection Time: 05/03/16 11:35 PM  Result Value Ref Range   WBC 10.9 (H) 4.0 - 10.5 K/uL   RBC 3.66 (L) 3.87 - 5.11 MIL/uL   Hemoglobin  11.5 (L) 12.0 - 15.0 g/dL   HCT 16.1 (L) 09.6 - 04.5 %   MCV 91.0 78.0 - 100.0 fL   MCH 31.4 26.0 - 34.0 pg   MCHC 34.5 30.0 - 36.0 g/dL   RDW 40.9 81.1 - 91.4 %   Platelets 262 150 - 400 K/uL    Patient Active Problem List   Diagnosis Date Noted  . Supervision of low-risk pregnancy 11/12/2015    Assessment: Sharon Waller is a 21 y.o. G1P0000 at [redacted]w[redacted]d here for SOL  #Labor:expectant managment #Pain: Plan for epidural #FWB: Category 1 #ID:  GBS negative #MOF: bottle #MOC: depo #Circ:  n/a  Ernestina Penna 05/04/2016, 12:50 AM

## 2016-05-04 NOTE — Anesthesia Procedure Notes (Signed)
Epidural Patient location during procedure: OB Start time: 05/04/2016 1:06 AM End time: 05/04/2016 1:16 AM  Staffing Anesthesiologist: Thaniel Coluccio Performed: anesthesiologist   Preanesthetic Checklist Completed: patient identified, site marked, pre-op evaluation, timeout performed, IV checked, risks and benefits discussed and monitors and equipment checked  Epidural Patient position: sitting Prep: DuraPrep Patient monitoring: heart rate, cardiac monitor, continuous pulse ox and blood pressure Approach: midline Location: L2-L3 Injection technique: LOR saline  Needle:  Needle type: Tuohy  Needle gauge: 17 G Needle length: 9 cm Needle insertion depth: 5 cm Catheter type: closed end flexible Catheter size: 19 Gauge Catheter at skin depth: 10 cm Test dose: negative and Other  Assessment Events: blood not aspirated, injection not painful, no injection resistance and negative IV test  Additional Notes Informed consent obtained prior to proceeding including risk of failure, 1% risk of PDPH, risk of minor discomfort and bruising.  Discussed rare but serious complications including epidural abscess, permanent nerve injury, epidural hematoma.  Discussed alternatives to epidural analgesia and patient desires to proceed.  Timeout performed pre-procedure verifying patient name, procedure, and platelet count.  Patient tolerated procedure well. Reason for block:procedure for pain

## 2016-05-05 LAB — CBC
HEMATOCRIT: 29.6 % — AB (ref 36.0–46.0)
HEMOGLOBIN: 9.9 g/dL — AB (ref 12.0–15.0)
MCH: 30.6 pg (ref 26.0–34.0)
MCHC: 33.4 g/dL (ref 30.0–36.0)
MCV: 91.4 fL (ref 78.0–100.0)
Platelets: 235 10*3/uL (ref 150–400)
RBC: 3.24 MIL/uL — ABNORMAL LOW (ref 3.87–5.11)
RDW: 15 % (ref 11.5–15.5)
WBC: 12.9 10*3/uL — ABNORMAL HIGH (ref 4.0–10.5)

## 2016-05-05 LAB — RPR: RPR Ser Ql: NONREACTIVE

## 2016-05-05 NOTE — Progress Notes (Signed)
Post Partum Day #1 Subjective: no complaints, up ad lib and tolerating PO; bottlefeeding; plans on Depo for contraception  Objective: Blood pressure 116/63, pulse (!) 54, temperature 97.9 F (36.6 C), temperature source Oral, resp. rate 20, height 5\' 6"  (1.676 m), weight 60.3 kg (133 lb), last menstrual period 09/08/2015, SpO2 100 %, unknown if currently breastfeeding.  Physical Exam:  General: alert, cooperative and no distress Lochia: appropriate Uterine Fundus: firm DVT Evaluation: No evidence of DVT seen on physical exam.   Recent Labs  05/03/16 2335 05/05/16 0606  HGB 11.5* 9.9*  HCT 33.3* 29.6*    Assessment/Plan: Plan for discharge tomorrow  Resume iron when home meds reconciled at discharge   LOS: 1 day   Cam HaiSHAW, KIMBERLY CNM 05/05/2016, 7:55 AM

## 2016-05-06 MED ORDER — IBUPROFEN 600 MG PO TABS: 600.0000 mg | ORAL_TABLET | Freq: Four times a day (QID) | ORAL | 0 refills | Status: DC

## 2016-05-06 NOTE — Discharge Instructions (Signed)

## 2016-05-06 NOTE — Discharge Summary (Signed)
OB Discharge Summary     Patient Name: Sharon Waller DOB: 1995-08-09 MRN: [id number]  Date of admission: 05/03/2016 Delivering MD: Ernestina Penna MICHAEL   Date of discharge: 05/06/2016  Admitting diagnosis: 38w ctx bad, 3 min apart, pressure Intrauterine pregnancy: [redacted]w[redacted]d     Secondary diagnosis:  Active Problems:   SVD (spontaneous vaginal delivery)  Additional problems: none     Discharge diagnosis: Term Pregnancy Delivered                                                                                                Post partum procedures:none  Augmentation: none]  Complications: None  Hospital course:  Onset of Labor With Vaginal Delivery     21 y.o. yo G1P1001 at [redacted]w[redacted]d was admitted in Active Labor on 05/03/2016. Patient had an uncomplicated labor course as follows:  Membrane Rupture Time/Date: 3:45 AM ,05/04/2016   Intrapartum Procedures: Episiotomy: None [1]                                         Lacerations:  1st degree [2]  Patient had a delivery of a Viable infant. 05/04/2016  Information for the patient's newborn:  Dallis, Darden Girl Carriann [[id number]]       Pateint had an uncomplicated postpartum course.  She is ambulating, tolerating a regular diet, passing flatus, and urinating well. Patient is discharged home in stable condition on 05/06/16.   Physical exam  Vitals:   05/04/16 1735 05/05/16 0523 05/05/16 1820 05/06/16 0536  BP: (!) 96/51 116/63 104/62 (!) 90/53  Pulse: 61 (!) 54 (!) 54 (!) 49  Resp: 18 20 18 18   Temp: 98.3 F (36.8 C) 97.9 F (36.6 C) 98.4 F (36.9 C) 98.2 F (36.8 C)  TempSrc: Oral Oral Oral   SpO2:      Weight:      Height:       General: alert, cooperative and no distress Lochia: appropriate Uterine Fundus: firm Incision: N/A DVT Evaluation: No evidence of DVT seen on physical exam. Negative Homan's sign. No significant calf/ankle edema. Labs: Lab Results  Component Value Date   WBC 12.9 (H) 05/05/2016   HGB 9.9 (L)  05/05/2016   HCT 29.6 (L) 05/05/2016   MCV 91.4 05/05/2016   PLT 235 05/05/2016   CMP Latest Ref Rng & Units 09/15/2015  Glucose 65 - 99 mg/dL 94  BUN 6 - 20 mg/dL 8  Creatinine 7.82 - 9.56 mg/dL 2.13  Sodium 086 - 578 mmol/L 135  Potassium 3.5 - 5.1 mmol/L 3.5  Chloride 101 - 111 mmol/L 105  CO2 22 - 32 mmol/L 24  Calcium 8.9 - 10.3 mg/dL 9.6  Total Protein 6.5 - 8.1 g/dL 6.8  Total Bilirubin 0.3 - 1.2 mg/dL 0.5  Alkaline Phos 38 - 126 U/L 43  AST 15 - 41 U/L 18  ALT 14 - 54 U/L 15    Discharge instruction: per After Visit Summary and "Baby and Me Booklet".  After visit meds:  Allergies  as of 05/06/2016      Reactions   Augmentin [amoxicillin-pot Clavulanate] Rash   Has patient had a PCN reaction causing immediate rash, facial/tongue/throat swelling, SOB or lightheadedness with hypotension: Yes Has patient had a PCN reaction causing severe rash involving mucus membranes or skin necrosis: No Has patient had a PCN reaction that required hospitalization Yes Has patient had a PCN reaction occurring within the last 10 years: No If all of the above answers are "NO", then may proceed with Cephalosporin use.      Medication List    STOP taking these medications   ferrous sulfate 325 (65 FE) MG EC tablet     TAKE these medications   ibuprofen 600 MG tablet Commonly known as:  ADVIL,MOTRIN Take 1 tablet (600 mg total) by mouth every 6 (six) hours.   PRENATAL COMPLETE 14-0.4 MG Tabs Take 1 tablet by mouth daily.       Diet: routine diet  Activity: Advance as tolerated. Pelvic rest for 6 weeks.   Outpatient follow up: Follow up Appt: Future Appointments Date Time Provider Department Center  06/09/2016 10:20 AM Dorathy KinsmanVirginia Smith, CNM [facility]   Follow up Visit:No Follow-up on file.  Postpartum contraception: Depo Provera  Newborn Data: Live born female  Birth Weight: 7 lb 4.1 oz (3291 g) APGAR: 9, 9  Baby Feeding: Bottle Disposition:home with  mother   05/06/2016 Greig RightRESENZO-DISHMAN,Sharon Willeford, CNM

## 2016-05-11 ENCOUNTER — Telehealth: Payer: Self-pay

## 2016-05-11 NOTE — Telephone Encounter (Signed)
Open in error

## 2016-06-09 ENCOUNTER — Ambulatory Visit (INDEPENDENT_AMBULATORY_CARE_PROVIDER_SITE_OTHER): Payer: Medicaid Other | Admitting: Advanced Practice Midwife

## 2016-06-09 ENCOUNTER — Encounter: Payer: Self-pay | Admitting: Advanced Practice Midwife

## 2016-06-09 ENCOUNTER — Other Ambulatory Visit (HOSPITAL_COMMUNITY)
Admission: RE | Admit: 2016-06-09 | Discharge: 2016-06-09 | Disposition: A | Discharge status: Home / Self Care | Payer: Medicaid Other | Source: Ambulatory Visit | Attending: Obstetrics & Gynecology | Admitting: Obstetrics & Gynecology

## 2016-06-09 VITALS — BP 119/76 | HR 56 | Temp 98.6°F | Ht 67.0 in | Wt 119.5 lb

## 2016-06-09 DIAGNOSIS — Z01419 Encounter for gynecological examination (general) (routine) without abnormal findings: Secondary | ICD-10-CM | POA: Insufficient documentation

## 2016-06-09 DIAGNOSIS — Z3042 Encounter for surveillance of injectable contraceptive: Secondary | ICD-10-CM

## 2016-06-09 DIAGNOSIS — Z3202 Encounter for pregnancy test, result negative: Secondary | ICD-10-CM

## 2016-06-09 DIAGNOSIS — Z30013 Encounter for initial prescription of injectable contraceptive: Secondary | ICD-10-CM

## 2016-06-09 DIAGNOSIS — Z124 Encounter for screening for malignant neoplasm of cervix: Secondary | ICD-10-CM | POA: Diagnosis present

## 2016-06-09 LAB — POCT PREGNANCY, URINE: Preg Test, Ur: NEGATIVE

## 2016-06-09 MED ORDER — MEDROXYPROGESTERONE ACETATE 150 MG/ML IM SUSP
150.0000 mg | INTRAMUSCULAR | Status: DC
  Administered 2016-06-09 – 2016-11-10 (×3): 150 mg via INTRAMUSCULAR

## 2016-06-09 NOTE — Progress Notes (Signed)
Subjective:     Sharon Waller is a 21 y.o. female who presents for a postpartum visit. She is 5 weeks postpartum following a spontaneous vaginal delivery. I have fully reviewed the prenatal and intrapartum course. The delivery was at 38 gestational weeks. Outcome: spontaneous vaginal delivery. Anesthesia: epidural. Postpartum course has been uncomplicated. Baby's course has been uncomplicated. Baby is feeding by bottle -  . Bleeding no bleeding. Bowel function is normal. Bladder function is normal. Patient is sexually active. Contraception method is condoms. Postpartum depression screening: negative.  The following portions of the patient's history were reviewed and updated as appropriate: allergies, current medications, past family history, past medical history, past social history, past surgical history and problem list.  Review of Systems Pertinent items are noted in HPI.   Objective:    There were no vitals taken for this visit.  General:  alert, cooperative, appears stated age and no distress   Breasts:  inspection negative, no nipple discharge or bleeding, no masses or nodularity palpable  Lungs: clear to auscultation bilaterally  Heart:  regular rate and rhythm, S1, S2 normal, no murmur, click, rub or gallop  Abdomen: soft, non-tender; bowel sounds normal; no masses,  no organomegaly   Vulva:  normal  Vagina: not evaluated  Cervix:  multiparous appearance, no cervical motion tenderness and scant bleeding after Pap  Corpus: normal size, contour, position, consistency, mobility, non-tender  Adnexa:  no mass, fullness, tenderness  Rectal Exam: Not performed.        Assessment:     Normal postpartum exam. Pap smear done at today's visit.  1. Screening for cervical cancer  - Cytology - PAP  2. Encounter for prescription for depo-Provera  - medroxyPROGESTERone (DEPO-PROVERA) injection 150 mg; Inject 1 mL (150 mg total) into the muscle every 3 (three) months.  Plan:    1.  Contraception: Depo-Provera injections 2. Recommend condoms for STD prevention 3. Follow up in: 12 weeks or as needed.

## 2016-06-10 NOTE — Patient Instructions (Signed)

## 2016-06-14 LAB — CYTOLOGY - PAP: DIAGNOSIS: NEGATIVE

## 2016-08-18 ENCOUNTER — Emergency Department (HOSPITAL_COMMUNITY)
Admission: EM | Admit: 2016-08-18 | Discharge: 2016-08-18 | Disposition: A | Discharge status: Home / Self Care | Payer: Medicaid Other | Attending: Emergency Medicine | Admitting: Emergency Medicine

## 2016-08-18 ENCOUNTER — Encounter (HOSPITAL_COMMUNITY): Payer: Self-pay | Admitting: Emergency Medicine

## 2016-08-18 DIAGNOSIS — M791 Myalgia: Secondary | ICD-10-CM | POA: Diagnosis not present

## 2016-08-18 DIAGNOSIS — Z87891 Personal history of nicotine dependence: Secondary | ICD-10-CM | POA: Diagnosis not present

## 2016-08-18 DIAGNOSIS — J45909 Unspecified asthma, uncomplicated: Secondary | ICD-10-CM | POA: Diagnosis not present

## 2016-08-18 DIAGNOSIS — Y92410 Unspecified street and highway as the place of occurrence of the external cause: Secondary | ICD-10-CM | POA: Insufficient documentation

## 2016-08-18 DIAGNOSIS — Y999 Unspecified external cause status: Secondary | ICD-10-CM | POA: Diagnosis not present

## 2016-08-18 DIAGNOSIS — Y939 Activity, unspecified: Secondary | ICD-10-CM | POA: Insufficient documentation

## 2016-08-18 DIAGNOSIS — Z79899 Other long term (current) drug therapy: Secondary | ICD-10-CM | POA: Insufficient documentation

## 2016-08-18 DIAGNOSIS — S299XXA Unspecified injury of thorax, initial encounter: Secondary | ICD-10-CM | POA: Insufficient documentation

## 2016-08-18 DIAGNOSIS — M7918 Myalgia, other site: Secondary | ICD-10-CM

## 2016-08-18 MED ORDER — IBUPROFEN 800 MG PO TABS: 800.0000 mg | ORAL_TABLET | Freq: Three times a day (TID) | ORAL | 0 refills | Status: DC

## 2016-08-18 MED ORDER — METHOCARBAMOL 500 MG PO TABS: 500.0000 mg | ORAL_TABLET | Freq: Two times a day (BID) | ORAL | 0 refills | Status: DC

## 2016-08-18 NOTE — ED Notes (Signed)
See EDP secondary assessment.  
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
## 2016-08-18 NOTE — Discharge Instructions (Signed)
Return if any problems.  See the Orthopaedist for recheck if pain persist,

## 2016-08-25 ENCOUNTER — Ambulatory Visit (INDEPENDENT_AMBULATORY_CARE_PROVIDER_SITE_OTHER): Payer: Medicaid Other | Admitting: *Deleted

## 2016-08-25 ENCOUNTER — Encounter: Payer: Self-pay | Admitting: *Deleted

## 2016-08-25 VITALS — BP 116/73 | HR 74

## 2016-08-25 DIAGNOSIS — Z3042 Encounter for surveillance of injectable contraceptive: Secondary | ICD-10-CM

## 2016-11-10 ENCOUNTER — Ambulatory Visit (INDEPENDENT_AMBULATORY_CARE_PROVIDER_SITE_OTHER): Payer: Medicaid Other | Admitting: *Deleted

## 2016-11-10 VITALS — BP 119/70 | HR 88

## 2016-11-10 DIAGNOSIS — Z3042 Encounter for surveillance of injectable contraceptive: Secondary | ICD-10-CM | POA: Diagnosis present

## 2016-11-10 NOTE — Progress Notes (Signed)
Agree with nursing staff's documentation of this patient's clinic encounter.  Zavion Sleight, MD    

## 2016-11-10 NOTE — Progress Notes (Signed)
Presents to clinic for depo provera injection. Tolerated well.

## 2016-12-06 ENCOUNTER — Emergency Department (HOSPITAL_COMMUNITY): Payer: Medicaid Other

## 2016-12-06 ENCOUNTER — Encounter (HOSPITAL_COMMUNITY): Payer: Self-pay | Admitting: Emergency Medicine

## 2016-12-06 ENCOUNTER — Emergency Department (HOSPITAL_COMMUNITY)
Admission: EM | Admit: 2016-12-06 | Discharge: 2016-12-06 | Disposition: A | Discharge status: Home / Self Care | Payer: Medicaid Other | Attending: Emergency Medicine | Admitting: Emergency Medicine

## 2016-12-06 DIAGNOSIS — Z87891 Personal history of nicotine dependence: Secondary | ICD-10-CM | POA: Diagnosis not present

## 2016-12-06 DIAGNOSIS — R51 Headache: Secondary | ICD-10-CM | POA: Diagnosis not present

## 2016-12-06 DIAGNOSIS — Z79899 Other long term (current) drug therapy: Secondary | ICD-10-CM | POA: Insufficient documentation

## 2016-12-06 DIAGNOSIS — J45909 Unspecified asthma, uncomplicated: Secondary | ICD-10-CM | POA: Insufficient documentation

## 2016-12-06 DIAGNOSIS — R519 Headache, unspecified: Secondary | ICD-10-CM

## 2016-12-06 MED ORDER — METOCLOPRAMIDE HCL 5 MG/ML IJ SOLN
10.0000 mg | Freq: Once | INTRAMUSCULAR | Status: AC
  Administered 2016-12-06: 10 mg via INTRAVENOUS
  Filled 2016-12-06: qty 2

## 2016-12-06 MED ORDER — DEXAMETHASONE SODIUM PHOSPHATE 10 MG/ML IJ SOLN
10.0000 mg | Freq: Once | INTRAMUSCULAR | Status: AC
  Administered 2016-12-06: 10 mg via INTRAVENOUS
  Filled 2016-12-06: qty 1

## 2016-12-06 MED ORDER — KETOROLAC TROMETHAMINE 15 MG/ML IJ SOLN
15.0000 mg | Freq: Once | INTRAMUSCULAR | Status: AC
  Administered 2016-12-06: 15 mg via INTRAVENOUS
  Filled 2016-12-06: qty 1

## 2016-12-06 MED ORDER — SODIUM CHLORIDE 0.9 % IV BOLUS (SEPSIS)
500.0000 mL | Freq: Once | INTRAVENOUS | Status: AC
  Administered 2016-12-06: 500 mL via INTRAVENOUS

## 2016-12-06 MED ORDER — DIPHENHYDRAMINE HCL 50 MG/ML IJ SOLN
25.0000 mg | Freq: Once | INTRAMUSCULAR | Status: AC
  Administered 2016-12-06: 25 mg via INTRAVENOUS
  Filled 2016-12-06: qty 1
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
## 2016-12-06 NOTE — ED Triage Notes (Signed)
Patient presents with HA x 4 weeks, started after Depo-shot. Says she's "tried everything". Been to Urgent Care but "they couldn't do much"

## 2016-12-06 NOTE — Discharge Instructions (Signed)
Please drink plenty of fluids Take Ibuprofen or Naproxen for headache as needed Try Flonase for sinus congestion' Return for worsening symptoms

## 2017-01-26 ENCOUNTER — Ambulatory Visit: Payer: Self-pay

## 2017-02-07 ENCOUNTER — Ambulatory Visit: Payer: Medicaid Other

## 2017-06-04 ENCOUNTER — Other Ambulatory Visit: Payer: Self-pay

## 2017-06-04 DIAGNOSIS — J45909 Unspecified asthma, uncomplicated: Secondary | ICD-10-CM | POA: Diagnosis not present

## 2017-06-04 DIAGNOSIS — R197 Diarrhea, unspecified: Secondary | ICD-10-CM | POA: Insufficient documentation

## 2017-06-04 DIAGNOSIS — R112 Nausea with vomiting, unspecified: Secondary | ICD-10-CM | POA: Insufficient documentation

## 2017-06-04 DIAGNOSIS — Z87891 Personal history of nicotine dependence: Secondary | ICD-10-CM | POA: Diagnosis not present

## 2017-06-05 ENCOUNTER — Encounter (HOSPITAL_COMMUNITY): Payer: Self-pay | Admitting: Emergency Medicine

## 2017-06-05 ENCOUNTER — Emergency Department (HOSPITAL_COMMUNITY)
Admission: EM | Admit: 2017-06-05 | Discharge: 2017-06-05 | Disposition: A | Discharge status: Home / Self Care | Payer: Medicaid Other | Attending: Physician Assistant | Admitting: Physician Assistant

## 2017-06-05 DIAGNOSIS — R112 Nausea with vomiting, unspecified: Secondary | ICD-10-CM

## 2017-06-05 DIAGNOSIS — R197 Diarrhea, unspecified: Secondary | ICD-10-CM

## 2017-06-05 LAB — COMPREHENSIVE METABOLIC PANEL
ALT: 23 U/L (ref 14–54)
ANION GAP: 9 (ref 5–15)
AST: 19 U/L (ref 15–41)
Albumin: 4.2 g/dL (ref 3.5–5.0)
Alkaline Phosphatase: 63 U/L (ref 38–126)
BILIRUBIN TOTAL: 0.8 mg/dL (ref 0.3–1.2)
BUN: 7 mg/dL (ref 6–20)
CALCIUM: 9.1 mg/dL (ref 8.9–10.3)
CO2: 26 mmol/L (ref 22–32)
Chloride: 103 mmol/L (ref 101–111)
Creatinine, Ser: 0.57 mg/dL (ref 0.44–1.00)
GFR calc Af Amer: >60 mL/min (ref >60)
Glucose, Bld: 98 mg/dL (ref 65–99)
POTASSIUM: 3.2 mmol/L — AB (ref 3.5–5.1)
Sodium: 138 mmol/L (ref 135–145)
TOTAL PROTEIN: 7.3 g/dL (ref 6.5–8.1)

## 2017-06-05 LAB — CBC
HEMATOCRIT: 39.1 % (ref 36.0–46.0)
HEMOGLOBIN: 12.8 g/dL (ref 12.0–15.0)
MCH: 30.4 pg (ref 26.0–34.0)
MCHC: 32.7 g/dL (ref 30.0–36.0)
MCV: 92.9 fL (ref 78.0–100.0)
Platelets: 266 10*3/uL (ref 150–400)
RBC: 4.21 MIL/uL (ref 3.87–5.11)
RDW: 13.3 % (ref 11.5–15.5)
WBC: 7.9 10*3/uL (ref 4.0–10.5)

## 2017-06-05 LAB — I-STAT BETA HCG BLOOD, ED (MC, WL, AP ONLY)

## 2017-06-05 LAB — LIPASE, BLOOD: Lipase: 25 U/L (ref 11–51)

## 2017-06-05 MED ORDER — ONDANSETRON HCL 4 MG/2ML IJ SOLN
4.0000 mg | Freq: Once | INTRAMUSCULAR | Status: AC
  Administered 2017-06-05: 4 mg via INTRAVENOUS
  Filled 2017-06-05: qty 2

## 2017-06-05 MED ORDER — GI COCKTAIL ~~LOC~~
30.0000 mL | Freq: Once | ORAL | Status: AC
  Administered 2017-06-05: 30 mL via ORAL
  Filled 2017-06-05: qty 30

## 2017-06-05 MED ORDER — ONDANSETRON 8 MG PO TBDP: 8.0000 mg | ORAL_TABLET | Freq: Three times a day (TID) | ORAL | 0 refills | Status: DC | PRN

## 2017-06-05 MED ORDER — SODIUM CHLORIDE 0.9 % IV BOLUS (SEPSIS)
1000.0000 mL | Freq: Once | INTRAVENOUS | Status: AC
  Administered 2017-06-05: 1000 mL via INTRAVENOUS

## 2017-06-05 NOTE — Discharge Instructions (Addendum)
Zofran for nausea and vomiting as prescribed. Take imodium for diarrhea only if needed. Follow up with family doctor for recheck. Return if worsening.

## 2017-06-05 NOTE — ED Provider Notes (Signed)
[facility] EMERGENCY DEPARTMENT Provider Note   CSN: [id number] Arrival date & time: 06/04/17  2347     History   Chief Complaint Chief Complaint  Patient presents with  . Generalized Body Aches  . Emesis  . Diarrhea    HPI Sharon Waller is a 22 y.o. female.  HPI Sharon Waller is a 22 y.o. female presents to emergency department with complaint of nausea, vomiting, diarrhea. Pt states she has been throwing up and having diarrhea all day today. States her young daughter had similar symptoms few days ago. Pt admits to subjective fevers, chills, mild cough and nasal congestion.  Reports body aches for the last 3 days.  Denies any blood in her stool or emesis.  She states she is unable to keep anything down including water.  No urinary symptoms.  No vaginal discharge or bleeding.  She has not tried any medications prior to coming in.  She has no abdominal pain  Past Medical History:  Diagnosis Date  . Asthma   . Bacterial vaginosis   . Bronchitis   . UTI (lower urinary tract infection)     There are no active problems to display for this patient.   Past Surgical History:  Procedure Laterality Date  . NO PAST SURGERIES       OB History    Gravida  1   Para  1   Term  1   Preterm  0   AB  0   Living  1     SAB  0   TAB  0   Ectopic  0   Multiple  0   Live Births  1            Home Medications    Prior to Admission medications   Medication Sig Start Date End Date Taking? Authorizing Provider  ibuprofen (ADVIL,MOTRIN) 800 MG tablet Take 1 tablet (800 mg total) by mouth 3 (three) times daily. 08/18/16   Elson Areas, PA-C  methocarbamol (ROBAXIN) 500 MG tablet Take 1 tablet (500 mg total) by mouth 2 (two) times daily. 08/18/16   Elson Areas, PA-C  Prenatal Vit-Fe Fumarate-FA (PRENATAL COMPLETE) 14-0.4 MG TABS Take 1 tablet by mouth daily. Patient not taking: Reported on 06/09/2016 09/15/15   Elpidio Anis, PA-C    dicyclomine (BENTYL) 20 MG tablet Take 1 tablet (20 mg total) by mouth 2 (two) times daily as needed for spasms. Patient not taking: Reported on 01/29/2014 05/21/13 01/24/15  Loren Racer, MD    Family History Family History  Problem Relation Age of Onset  . Cancer Maternal Aunt   . Cancer Maternal Grandmother     Social History Social History   Tobacco Use  . Smoking status: Former Smoker    Types: Cigarettes    Last attempt to quit: 09/30/2015    Years since quitting: 1.6  . Smokeless tobacco: Never Used  Substance Use Topics  . Alcohol use: No  . Drug use: No     Allergies   Augmentin [amoxicillin-pot clavulanate]   Review of Systems Review of Systems  Constitutional: Positive for chills, fatigue and fever.  HENT: Positive for congestion. Negative for sore throat.   Respiratory: Positive for cough. Negative for chest tightness and shortness of breath.   Cardiovascular: Negative for chest pain, palpitations and leg swelling.  Gastrointestinal: Positive for diarrhea, nausea and vomiting. Negative for abdominal pain, blood in stool and constipation.  Genitourinary: Negative for dysuria, flank pain,  pelvic pain, vaginal bleeding, vaginal discharge and vaginal pain.  Musculoskeletal: Positive for myalgias. Negative for arthralgias, neck pain and neck stiffness.  Skin: Negative for rash.  Neurological: Positive for weakness and headaches. Negative for dizziness.  All other systems reviewed and are negative.    Physical Exam Updated Vital Signs BP 105/70 (BP Location: Right Arm)   Pulse 77   Temp 98.8 F (37.1 C) (Oral)   SpO2 97%   Physical Exam  Constitutional: She is oriented to person, place, and time. She appears well-developed and well-nourished. No distress.  HENT:  Head: Normocephalic.  Eyes: Conjunctivae are normal.  Neck: Neck supple.  Cardiovascular: Normal rate, regular rhythm and normal heart sounds.  Pulmonary/Chest: Effort normal and breath  sounds normal. No respiratory distress. She has no wheezes. She has no rales.  Abdominal: Soft. Bowel sounds are normal. She exhibits no distension. There is no tenderness. There is no rebound.  Musculoskeletal: She exhibits no edema.  Neurological: She is alert and oriented to person, place, and time.  Skin: Skin is warm and dry.  Psychiatric: She has a normal mood and affect. Her behavior is normal.  Nursing note and vitals reviewed.    ED Treatments / Results  Labs (all labs ordered are listed, but only abnormal results are displayed) Labs Reviewed  COMPREHENSIVE METABOLIC PANEL - Abnormal; Notable for the following components:      Result Value   Potassium 3.2 (*)    All other components within normal limits  LIPASE, BLOOD  CBC  URINALYSIS, ROUTINE W REFLEX MICROSCOPIC  I-STAT BETA HCG BLOOD, ED ([facility], [facility], [facility] ONLY)    EKG None  Radiology No results found.  Procedures Procedures (including critical care time)  Medications Ordered in ED Medications  sodium chloride 0.9 % bolus 1,000 mL (has no administration in time range)  ondansetron (ZOFRAN) injection 4 mg (has no administration in time range)  gi cocktail (Maalox,Lidocaine,Donnatal) (has no administration in time range)     Initial Impression / Assessment and Plan / ED Course  I have reviewed the triage vital signs and the nursing notes.  Pertinent labs & imaging results that were available during my care of the patient were reviewed by me and considered in my medical decision making (see chart for details).     Patient in emergency department with nausea, vomiting, diarrhea.  She states she is unable to keep anything down.  Her abdomen is nontender.  No blood in her stool or emesis.  Vital signs are normal here, labs unremarkable other than potassium of 3.2.  Will hydrate and give antiemetics and try oral trial.  5:55 AM Feeling better. Drinking ginger ale with no vomiting. Abdomen soft, non tender. Most likely  viral gastroenteritis. Plan to dc home with close outpatient follow up. Return precautions discussed.   Vitals:   06/05/17 0435 06/05/17 0500  BP: 109/68 106/73  Pulse: 74 71  Resp: 16   Temp: 99 F (37.2 C)   SpO2: 99% 99%     Final Clinical Impressions(s) / ED Diagnoses   Final diagnoses:  Nausea vomiting and diarrhea    ED Discharge Orders        Ordered    ondansetron (ZOFRAN ODT) 8 MG disintegrating tablet  Every 8 hours PRN     06/05/17 0556       Jaynie CrumbleKirichenko, Buckley Bradly, PA-C 06/05/17 0557    Mackuen, Cindee Saltourteney Lyn, MD 06/07/17 1554

## 2017-06-05 NOTE — ED Triage Notes (Signed)
Pt reports body aches, emesis, diarrhea, headaches since Thursday.  OTC medications not working

## 2017-06-27 ENCOUNTER — Encounter: Payer: Self-pay | Admitting: *Deleted

## 2017-07-21 ENCOUNTER — Ambulatory Visit: Payer: Medicaid Other | Admitting: Obstetrics & Gynecology

## 2018-01-17 LAB — CHG URINE PREGNANCY TEST VISUAL COLOR CMPRSN METHS: Preg Test, Ur: POSITIVE

## 2018-02-13 ENCOUNTER — Ambulatory Visit (INDEPENDENT_AMBULATORY_CARE_PROVIDER_SITE_OTHER): Payer: Medicaid Other

## 2018-02-13 VITALS — Wt 126.5 lb

## 2018-02-13 DIAGNOSIS — O219 Vomiting of pregnancy, unspecified: Secondary | ICD-10-CM

## 2018-02-13 MED ORDER — PROMETHAZINE HCL 12.5 MG PO TABS: 12.5000 mg | ORAL_TABLET | Freq: Four times a day (QID) | ORAL | 0 refills | Status: DC | PRN

## 2018-02-13 MED ORDER — ONDANSETRON HCL 8 MG PO TABS: 8.0000 mg | ORAL_TABLET | Freq: Three times a day (TID) | ORAL | 2 refills | Status: DC | PRN

## 2018-02-13 NOTE — Progress Notes (Signed)
Pt is here for nausea, states the nausea started at 6 weeks in her pregnancy. Pt has New OB intake 04/03/17 and is just here today for nausea medications. Advised pt to eat smaller meals throughout the day and drink plenty of water.

## 2018-02-13 NOTE — Addendum Note (Signed)
Addended by: Tamera StandsWALLACE, Gabriellah Rabel S on: 02/13/2018 10:05 AM   Modules accepted: Orders

## 2018-02-13 NOTE — Addendum Note (Signed)
Addended by: Ernestina PatchesAPEL, Chaynce Schafer S on: 02/13/2018 10:33 AM   Modules accepted: Level of Service

## 2018-02-13 NOTE — Progress Notes (Signed)
Patient reports pregnancy related to nausea. No new pregnancy episode created, unsure LMP. Was here for nurse visit and Rx for Zofran. Given presumed early pregnancy, would not recommend treatment with Zofran. Rx for phenergan to pharmacy.   Cristal DeerLaurel S. Earlene PlaterWallace, DO OB/GYN Fellow

## 2018-02-20 ENCOUNTER — Encounter: Payer: Self-pay | Admitting: *Deleted

## 2018-02-23 ENCOUNTER — Encounter: Payer: Self-pay | Admitting: *Deleted

## 2018-04-17 ENCOUNTER — Encounter: Payer: Medicaid Other | Admitting: Student

## 2018-04-23 ENCOUNTER — Ambulatory Visit: Payer: Medicaid Other | Admitting: Advanced Practice Midwife

## 2018-06-27 ENCOUNTER — Emergency Department (HOSPITAL_COMMUNITY)
Admission: EM | Admit: 2018-06-27 | Discharge: 2018-06-27 | Disposition: A | Discharge status: Home / Self Care | Payer: Medicaid Other | Attending: Emergency Medicine | Admitting: Emergency Medicine

## 2018-06-27 ENCOUNTER — Emergency Department (HOSPITAL_COMMUNITY): Payer: Medicaid Other

## 2018-06-27 ENCOUNTER — Other Ambulatory Visit: Payer: Self-pay

## 2018-06-27 ENCOUNTER — Encounter (HOSPITAL_COMMUNITY): Payer: Self-pay

## 2018-06-27 DIAGNOSIS — Z87891 Personal history of nicotine dependence: Secondary | ICD-10-CM | POA: Diagnosis not present

## 2018-06-27 DIAGNOSIS — R0789 Other chest pain: Secondary | ICD-10-CM | POA: Diagnosis not present

## 2018-06-27 DIAGNOSIS — R0602 Shortness of breath: Secondary | ICD-10-CM | POA: Diagnosis not present

## 2018-06-27 DIAGNOSIS — R002 Palpitations: Secondary | ICD-10-CM

## 2018-06-27 LAB — CBC WITH DIFFERENTIAL/PLATELET
Abs Immature Granulocytes: 0.01 10*3/uL (ref 0.00–0.07)
Basophils Absolute: 0 10*3/uL (ref 0.0–0.1)
Basophils Relative: 0 %
Eosinophils Absolute: 0.1 10*3/uL (ref 0.0–0.5)
Eosinophils Relative: 1 %
HCT: 40.6 % (ref 36.0–46.0)
Hemoglobin: 12.8 g/dL (ref 12.0–15.0)
Immature Granulocytes: 0 %
Lymphocytes Relative: 36 %
Lymphs Abs: 1.8 10*3/uL (ref 0.7–4.0)
MCH: 29.3 pg (ref 26.0–34.0)
MCHC: 31.5 g/dL (ref 30.0–36.0)
MCV: 92.9 fL (ref 80.0–100.0)
Monocytes Absolute: 0.4 10*3/uL (ref 0.1–1.0)
Monocytes Relative: 8 %
Neutro Abs: 2.7 10*3/uL (ref 1.7–7.7)
Neutrophils Relative %: 55 %
Platelets: 268 10*3/uL (ref 150–400)
RBC: 4.37 MIL/uL (ref 3.87–5.11)
RDW: 13.7 % (ref 11.5–15.5)
WBC: 4.9 10*3/uL (ref 4.0–10.5)
nRBC: 0 % (ref 0.0–0.2)

## 2018-06-27 LAB — D-DIMER, QUANTITATIVE: D-Dimer, Quant: 0.38 ug/mL-FEU (ref 0.00–0.50)

## 2018-06-27 LAB — URINALYSIS, ROUTINE W REFLEX MICROSCOPIC
Bilirubin Urine: NEGATIVE
Glucose, UA: NEGATIVE mg/dL
Hgb urine dipstick: NEGATIVE
Ketones, ur: NEGATIVE mg/dL
Leukocytes,Ua: NEGATIVE
Nitrite: NEGATIVE
Protein, ur: NEGATIVE mg/dL
Specific Gravity, Urine: 1.006 (ref 1.005–1.030)
pH: 6 (ref 5.0–8.0)

## 2018-06-27 LAB — RAPID URINE DRUG SCREEN, HOSP PERFORMED
Amphetamines: NOT DETECTED
Barbiturates: NOT DETECTED
Benzodiazepines: NOT DETECTED
Cocaine: NOT DETECTED
Opiates: NOT DETECTED
Tetrahydrocannabinol: NOT DETECTED

## 2018-06-27 LAB — COMPREHENSIVE METABOLIC PANEL
ALT: 13 U/L (ref 0–44)
AST: 17 U/L (ref 15–41)
Albumin: 4.1 g/dL (ref 3.5–5.0)
Alkaline Phosphatase: 47 U/L (ref 38–126)
Anion gap: 8 (ref 5–15)
BUN: 8 mg/dL (ref 6–20)
CO2: 26 mmol/L (ref 22–32)
Calcium: 9.3 mg/dL (ref 8.9–10.3)
Chloride: 104 mmol/L (ref 98–111)
Creatinine, Ser: 0.59 mg/dL (ref 0.44–1.00)
GFR calc Af Amer: >60 mL/min (ref >60)
GFR calc non Af Amer: >60 mL/min (ref >60)
Glucose, Bld: 89 mg/dL (ref 70–99)
Potassium: 3.5 mmol/L (ref 3.5–5.1)
Sodium: 138 mmol/L (ref 135–145)
Total Bilirubin: 0.6 mg/dL (ref 0.3–1.2)
Total Protein: 7.6 g/dL (ref 6.5–8.1)

## 2018-06-27 LAB — PREGNANCY, URINE: Preg Test, Ur: NEGATIVE

## 2018-06-27 LAB — TSH: TSH: 1.056 u[IU]/mL (ref 0.350–4.500)

## 2018-06-27 MED ORDER — LORAZEPAM 0.5 MG PO TABS: 0.5000 mg | ORAL_TABLET | Freq: Two times a day (BID) | ORAL | 0 refills | Status: DC | PRN

## 2018-06-27 MED ORDER — SODIUM CHLORIDE 0.9 % IV BOLUS
1000.0000 mL | Freq: Once | INTRAVENOUS | Status: AC
  Administered 2018-06-27: 1000 mL via INTRAVENOUS

## 2018-06-27 NOTE — ED Provider Notes (Signed)
[facility] EMERGENCY DEPARTMENT Provider Note   CSN: [id number] Arrival date & time: 06/27/18  1402    History   Chief Complaint Chief Complaint  Patient presents with  . SOB/heart racing    HPI Sharon Waller is a 23 y.o. female.     Pt presents to the ED today with sob and cp.  The pt said she's been feeling this way for about 2 weeks.  She went to urgent care about a week ago and they gave her an inhaler and atarax for anxiety.  She said the atarax just makes her tired.  She denies f/c.  She said her HR gets faster when she stands up quickly.     Past Medical History:  Diagnosis Date  . Asthma   . Bacterial vaginosis   . Bronchitis   . UTI (lower urinary tract infection)     There are no active problems to display for this patient.   Past Surgical History:  Procedure Laterality Date  . NO PAST SURGERIES       OB History    Gravida  2   Para  1   Term  1   Preterm  0   AB  0   Living  1     SAB  0   TAB  0   Ectopic  0   Multiple  0   Live Births  1            Home Medications    Prior to Admission medications   Medication Sig Start Date End Date Taking? Authorizing Provider  LORazepam (ATIVAN) 0.5 MG tablet Take 1 tablet (0.5 mg total) by mouth 2 (two) times daily as needed for anxiety. 06/27/18   Jacalyn Lefevre, MD  Prenatal Vit-Fe Fumarate-FA (PRENATAL COMPLETE) 14-0.4 MG TABS Take 1 tablet by mouth daily. 09/15/15   Elpidio Anis, PA-C  promethazine (PHENERGAN) 12.5 MG tablet Take 1 tablet (12.5 mg total) by mouth every 6 (six) hours as needed for nausea or vomiting. 02/13/18   Tamera Stands, DO    Family History Family History  Problem Relation Age of Onset  . Cancer Maternal Aunt   . Cancer Maternal Grandmother     Social History Social History   Tobacco Use  . Smoking status: Former Smoker    Types: Cigarettes    Last attempt to quit: 09/30/2015    Years since quitting: 2.7  . Smokeless  tobacco: Never Used  Substance Use Topics  . Alcohol use: No  . Drug use: No     Allergies   Augmentin [amoxicillin-pot clavulanate]   Review of Systems Review of Systems  Respiratory: Positive for shortness of breath.   Cardiovascular: Positive for palpitations.  All other systems reviewed and are negative.    Physical Exam Updated Vital Signs BP 107/86 (BP Location: Right Arm)   Pulse 68   Temp 98 F (36.7 C) (Oral)   Resp 20   LMP 12/16/2017 (Exact Date)   SpO2 98%   Physical Exam Vitals signs and nursing note reviewed.  Constitutional:      Appearance: Normal appearance.  HENT:     Head: Normocephalic and atraumatic.     Right Ear: External ear normal.     Left Ear: External ear normal.     Nose: Nose normal.     Mouth/Throat:     Mouth: Mucous membranes are moist.  Eyes:     Extraocular Movements: Extraocular movements intact.  Pupils: Pupils are equal, round, and reactive to light.  Neck:     Musculoskeletal: Normal range of motion.  Cardiovascular:     Rate and Rhythm: Normal rate and regular rhythm.     Pulses: Normal pulses.     Heart sounds: Normal heart sounds.  Pulmonary:     Effort: Pulmonary effort is normal.     Breath sounds: Normal breath sounds.  Abdominal:     General: Abdomen is flat. Bowel sounds are normal.     Palpations: Abdomen is soft.  Musculoskeletal: Normal range of motion.  Skin:    General: Skin is warm.     Capillary Refill: Capillary refill takes less than 2 seconds.  Neurological:     General: No focal deficit present.     Mental Status: She is alert and oriented to person, place, and time.  Psychiatric:        Mood and Affect: Mood normal.        Behavior: Behavior normal.      ED Treatments / Results  Labs (all labs ordered are listed, but only abnormal results are displayed) Labs Reviewed  URINALYSIS, ROUTINE W REFLEX MICROSCOPIC - Abnormal; Notable for the following components:      Result Value    Color, Urine STRAW (*)    All other components within normal limits  COMPREHENSIVE METABOLIC PANEL  CBC WITH DIFFERENTIAL/PLATELET  PREGNANCY, URINE  TSH  RAPID URINE DRUG SCREEN, HOSP PERFORMED  D-DIMER, QUANTITATIVE ([facility])    EKG EKG Interpretation  Date/Time:  Wednesday June 27 2018 14:15:29 EDT Ventricular Rate:  74 PR Interval:  138 QRS Duration: 90 QT Interval:  374 QTC Calculation: 415 R Axis:   81 Text Interpretation:  Normal sinus rhythm T wave abnormality, consider inferior ischemia Abnormal ECG No significant change since last tracing Confirmed by Jacalyn LefevreHaviland, Mouhamad Teed [phone]([id number]) on 06/27/2018 2:34:07 PM   Radiology Dg Chest 2 View  Result Date: 06/27/2018 CLINICAL DATA:  23 year old female EXAM: CHEST - 2 VIEW COMPARISON:  04/12/2015 FINDINGS: The heart size and mediastinal contours are within normal limits. Both lungs are clear. Redemonstration of scoliotic curvature with no acute displaced fracture IMPRESSION: Negative for acute cardiopulmonary disease Electronically Signed   By: Gilmer MorJaime  Wagner D.O.   On: 06/27/2018 14:48    Procedures Procedures (including critical care time)  Medications Ordered in ED Medications  sodium chloride 0.9 % bolus 1,000 mL (1,000 mLs Intravenous New Bag/Given 06/27/18 1511)     Initial Impression / Assessment and Plan / ED Course  I have reviewed the triage vital signs and the nursing notes.  Pertinent labs & imaging results that were available during my care of the patient were reviewed by me and considered in my medical decision making (see chart for details).       Pt is feeling better after IVFs.  She is instructed to return if worse.  F/u with pcp.  Jonte I Hilda Bladesrmstrong was evaluated in Emergency Department on 06/27/2018 for the symptoms described in the history of present illness. She was evaluated in the context of the global COVID-19 pandemic, which necessitated consideration that the patient might be at risk for infection  with the SARS-CoV-2 virus that causes COVID-19. Institutional protocols and algorithms that pertain to the evaluation of patients at risk for COVID-19 are in a state of rapid change based on information released by regulatory bodies including the CDC and federal and state organizations. These policies and algorithms were followed during the patient's care  in the ED.  Final Clinical Impressions(s) / ED Diagnoses   Final diagnoses:  Palpitations    ED Discharge Orders         Ordered    LORazepam (ATIVAN) 0.5 MG tablet  2 times daily PRN     06/27/18 1609           Jacalyn Lefevre, MD 06/27/18 1610

## 2018-06-27 NOTE — ED Notes (Signed)
Patient transported to X-ray 

## 2018-06-27 NOTE — ED Notes (Signed)
Patient verbalizes understanding of discharge instructions. Opportunity for questioning and answers were provided. Armband removed by staff, pt discharged from ED. Pt ambulatory to lobby. Prescriptions and pharmacy reviewed.

## 2018-06-27 NOTE — ED Triage Notes (Signed)
Patient complains of dry cough and intermittent feeling of heart racing x 2 weeks. Hx of asthma but reports that this is different. Denies fever, no cough, NAD

## 2018-07-11 ENCOUNTER — Ambulatory Visit: Payer: Medicaid Other | Attending: Family Medicine | Admitting: Family Medicine

## 2018-07-11 ENCOUNTER — Other Ambulatory Visit: Payer: Self-pay

## 2018-07-11 ENCOUNTER — Encounter: Payer: Self-pay | Admitting: Family Medicine

## 2018-07-11 VITALS — BP 114/77 | HR 70 | Temp 98.4°F | Resp 18 | Ht 66.0 in | Wt 128.5 lb

## 2018-07-11 DIAGNOSIS — R0789 Other chest pain: Secondary | ICD-10-CM | POA: Diagnosis not present

## 2018-07-11 DIAGNOSIS — R1024 Suprapubic pain: Secondary | ICD-10-CM

## 2018-07-11 DIAGNOSIS — R5383 Other fatigue: Secondary | ICD-10-CM

## 2018-07-11 DIAGNOSIS — N644 Mastodynia: Secondary | ICD-10-CM

## 2018-07-11 DIAGNOSIS — R002 Palpitations: Secondary | ICD-10-CM

## 2018-07-11 DIAGNOSIS — J309 Allergic rhinitis, unspecified: Secondary | ICD-10-CM | POA: Insufficient documentation

## 2018-07-11 DIAGNOSIS — R82998 Other abnormal findings in urine: Secondary | ICD-10-CM

## 2018-07-11 DIAGNOSIS — J4520 Mild intermittent asthma, uncomplicated: Secondary | ICD-10-CM

## 2018-07-11 DIAGNOSIS — R102 Pelvic and perineal pain: Secondary | ICD-10-CM | POA: Diagnosis not present

## 2018-07-11 HISTORY — DX: Allergic rhinitis, unspecified: J30.9

## 2018-07-11 HISTORY — DX: Mild intermittent asthma, uncomplicated: J45.20

## 2018-07-11 LAB — POCT URINALYSIS DIP (CLINITEK)
Bilirubin, UA: NEGATIVE
Blood, UA: NEGATIVE
Glucose, UA: NEGATIVE mg/dL
Ketones, POC UA: NEGATIVE mg/dL
Nitrite, UA: NEGATIVE
POC PROTEIN,UA: NEGATIVE
Spec Grav, UA: 1.02
Urobilinogen, UA: 1 U/dL
pH, UA: 6.5

## 2018-07-11 MED ORDER — PREDNISONE 20 MG PO TABS: ORAL_TABLET | ORAL | 0 refills | Status: DC

## 2018-07-11 NOTE — Patient Instructions (Addendum)
A prescription has been sent to your pharmacy for prednisone which can help with inflammation and sensation of chest tightness. You can also take over the counter ibuprofen or naproxen as needed for pain as long as this medications due not affect your asthma. You have also been referred to see a heart doctor about your episodes of increased heart rate. You will also be contacted to have an ultrasound done of your left breast due to breast tenderness.  Costochondritis Costochondritis is swelling and irritation (inflammation) of the tissue (cartilage) that connects your ribs to your breastbone (sternum). This causes pain in the front of your chest. Usually, the pain:  Starts gradually.  Is in more than one rib. This condition usually goes away on its own over time. Follow these instructions at home:  Do not do anything that makes your pain worse.  If directed, put ice on the painful area: ? Put ice in a plastic bag. ? Place a towel between your skin and the bag. ? Leave the ice on for 20 minutes, 2-3 times a day.  If directed, put heat on the affected area as often as told by your doctor. Use the heat source that your doctor tells you to use, such as a moist heat pack or a heating pad. ? Place a towel between your skin and the heat source. ? Leave the heat on for 20-30 minutes. ? Take off the heat if your skin turns bright red. This is very important if you cannot feel pain, heat, or cold. You may have a greater risk of getting burned.  Take over-the-counter and prescription medicines only as told by your doctor.  Return to your normal activities as told by your doctor. Ask your doctor what activities are safe for you.  Keep all follow-up visits as told by your doctor. This is important. Contact a doctor if:  You have chills or a fever.  Your pain does not go away or it gets worse.  You have a cough that does not go away. Get help right away if:  You are short of breath. This  information is not intended to replace advice given to you by your health care provider. Make sure you discuss any questions you have with your health care provider. Document Released: 08/17/2007 Document Revised: 09/18/2015 Document Reviewed: 06/24/2015 Elsevier Interactive Patient Education  2019 [employer].

## 2018-07-11 NOTE — Progress Notes (Signed)
New Patient Office Visit  Subjective:  Patient ID: Sharon Waller, female    DOB: Jan 19, 1996  Age: 23 y.o. MRN: [id number]  CC:  Chief Complaint  Patient presents with  . Shortness of Breath  . Chest Pain  . Fatigue  . Medication Refill    HPI Sharon Waller presents for ED follow-up on 06/27/2018 for palpitations.  Patient with complaint of episodes of feeling as if she is having a racing heart rate and shortness of breath.  Patient reports that this can occur randomly.  She has noticed that sometimes she will have an increased heart rate with sudden movements such as standing up quickly.  Patient does have a history of asthma.  Prior to her recent emergency department visit on 06/27/2018 she had gone to an urgent care and states that she was told that her symptoms were probably related to anxiety and patient was given Atarax as well as an albuterol inhaler.  Patient states that all the Atarax did was cause her to feel sleepy.  At the emergency department per ED notes, patient was given IV fluids which did seem to help the patient feel better and patient was given lorazepam.  Patient does not feel that her symptoms are related to anxiety.      Patient reports that she has been having some left-sided chest pain.  Patient states that her left chest near her breastbone has felt painful to touch and with deep breaths.  Patient has also felt as if she has had some left upper outer breast pain.  She also reports that she has felt more fatigued.  She denies any possibility of her pregnancy.  Patient has noticed some mild lower abdominal discomfort.  Patient does not think that she is having any urinary frequency, urgency or burning/discomfort with urination.  No fever or chills.  She states that her asthma has been well controlled.  She does not believe that her episodes of shortness of breath during her increased heart rate are asthma related as she has had no issues with coughing, chest tightness,  or wheezing related to her asthma.  Patient has had some recent increase in nasal congestion and postnasal drainage as well as sneezing related to allergic rhinitis.  Past Medical History:  Diagnosis Date  . Asthma   . Bacterial vaginosis   . Bronchitis   . UTI (lower urinary tract infection)     Past Surgical History:  Procedure Laterality Date  . NO PAST SURGERIES      Family History  Problem Relation Age of Onset  . Cancer Maternal Aunt   . Cancer Maternal Grandmother   . Breast cancer Maternal Grandmother    Social History   Tobacco Use  . Smoking status: Former Smoker    Types: Cigarettes    Last attempt to quit: 09/30/2015    Years since quitting: 2.7  . Smokeless tobacco: Never Used  Substance Use Topics  . Alcohol use: No  . Drug use: No     ROS Review of Systems  Constitutional: Positive for fatigue. Negative for chills and fever.  HENT: Positive for congestion, postnasal drip and rhinorrhea. Negative for ear pain, nosebleeds, sinus pressure, sinus pain, sore throat and trouble swallowing.   Respiratory: Positive for chest tightness and shortness of breath. Negative for cough and wheezing.   Cardiovascular: Positive for chest pain (pain in left chest along the breastbone) and palpitations. Negative for leg swelling.  Gastrointestinal: Negative for abdominal pain, blood in  stool, constipation, diarrhea and nausea.  Endocrine: Negative for polydipsia and polyphagia.  Genitourinary: Positive for frequency. Negative for dysuria and flank pain.  Musculoskeletal: Positive for arthralgias (chest wall pain). Negative for back pain and gait problem.  Neurological: Positive for dizziness. Negative for headaches.  Hematological: Negative for adenopathy.    Objective:   Today's Vitals: BP 114/77 (BP Location: Right Arm, Patient Position: Sitting, Cuff Size: Normal)   Pulse 70   Temp 98.4 F (36.9 C) (Oral)   Resp 18   Ht  (1.676 m)   Wt 128 lb 8 oz (58.3 kg)    LMP 06/13/2018 (Exact Date)   SpO2 100%   Breastfeeding No   BMI 20.74 kg/m   Physical Exam Vitals signs and nursing note reviewed.  Constitutional:      General: She is not in acute distress.    Appearance: She is well-developed.     Comments: Thin framed young adult female in NAD  HENT:     Head: Normocephalic and atraumatic.     Right Ear: Hearing, tympanic membrane, ear canal and external ear normal.     Left Ear: Hearing, tympanic membrane, ear canal and external ear normal.     Nose: Mucosal edema (mild, pale mucosa), congestion and rhinorrhea (mild, clear) present.     Mouth/Throat:     Pharynx: Pharyngeal swelling (mild) and posterior oropharyngeal erythema (mild) present.  Neck:     Musculoskeletal: Normal range of motion and neck supple. No neck rigidity or muscular tenderness.     Vascular: No carotid bruit.     Comments: No thyromegaly Cardiovascular:     Rate and Rhythm: Normal rate and regular rhythm.     Pulses: Normal pulses.     Heart sounds: Normal heart sounds.  Pulmonary:     Effort: Pulmonary effort is normal.     Breath sounds: Normal breath sounds.  Chest:     Breasts:        Right: Normal. No inverted nipple, mass, nipple discharge, skin change or tenderness.        Left: Tenderness present. No swelling, bleeding, inverted nipple, nipple discharge or skin change.    Abdominal:     Palpations: Abdomen is soft.     Tenderness: There is abdominal tenderness (suprapubic tenderness). There is no right CVA tenderness, left CVA tenderness, guarding or rebound.  Musculoskeletal:        General: No tenderness or deformity.     Right lower leg: No edema.     Left lower leg: No edema.  Lymphadenopathy:     Cervical: No cervical adenopathy.     Upper Body:     Right upper body: No supraclavicular, axillary or pectoral adenopathy.     Left upper body: No supraclavicular, axillary or pectoral adenopathy.  Skin:    General: Skin is warm and dry.   Neurological:     Mental Status: She is alert.  Psychiatric:        Mood and Affect: Mood normal.        Behavior: Behavior normal.        Thought Content: Thought content normal.        Judgment: Judgment normal.     Assessment & Plan:  1. Palpitations Patient with complaint of sensation of palpitations with increased heart rate as well as shortness of breath.  Patient will be referred to cardiology for further evaluation and treatment.  Patient has had recent normal electrolytes and normal CBC in the  emergency department.  TSH was normal but will repeat along with T4 as well as thyroid antibodies to look for possible thyroiditis. - Ambulatory referral to Cardiology - Thyroid antibodies - T4 AND TSH  2. Left-sided chest wall pain Patient with left-sided chest wall pain which I suspect may be related to costochondritis.  Will order sed rate and patient will be placed on prednisone.  Patient is also encouraged to take over-the-counter nonsteroidal anti-inflammatories for the next 5 to 6 days to help with chest wall pain.  Patient should also pay sure that she eats prior to taking the prednisone and nonsteroidal anti-inflammatories in order to avoid GI upset. - Sedimentation Rate - predniSONE (DELTASONE) 20 MG tablet; Two pills once daily x 5 days; take after eating  Dispense: 10 tablet; Refill: 0  3. Breast tenderness in female Patient with left medial and upper breast tenderness to palpation as well as increased tissue density which is likely secondary to fibrocystic breast changes.  Ultrasound of the breast will be ordered for further evaluation. - US BREAST LTD UNI LEFT INC AXILLA; Future  4. Fatigue, unspecified type Patient with complaint of increased fatigue.  Patient has had recent normal CBC and complete metabolic panel on 06/27/2018 at the emergency department.  Will check vitamin D level to look for vitamin D deficiency, urinalysis to look for urinary tract infection and T4/TSH  and thyroid antibodies to look for thyroid disorder. - POCT URINALYSIS DIP (CLINITEK) - Vitamin D, 25-hydroxy - Thyroid antibodies - T4 AND TSH - Urine Culture  5. Suprapubic discomfort Patient with suprapubic discomfort on examination and urinalysis done to look for possible urinary tract infection.  Patient did have presence of leukocytes in the urine therefore urine will be sent for culture and patient will be notified if further treatment is needed based on the culture results. - POCT URINALYSIS DIP (CLINITEK) - Urine Culture  6. Mild intermittent asthma without complication Patient with mild intermittent asthma and has had some recent chest discomfort and episodes of shortness of breath.  Patient does have home albuterol inhaler to use as needed.  I suspect that her symptoms are more so related to costochondritis due to reproducibility of left medial chest wall pain but patient is also being placed on prednisone which would help both asthma as well as inflammation if costochondritis is the cause of her pain. - predniSONE (DELTASONE) 20 MG tablet; Two pills once daily x 5 days; take after eating  Dispense: 10 tablet; Refill: 0  7. Allergic rhinitis, unspecified seasonality, unspecified trigger Patient with allergic rhinitis symptoms and patient states that she will try an over-the-counter medication.  8. Leukocytes in urine Patient with leukocytes in the urine and urine will be sent for culture and patient will be notified if further treatment is needed based on the urine culture results - Urine Culture   Outpatient Encounter Medications as of 07/11/2018  Medication Sig  . LORazepam (ATIVAN) 0.5 MG tablet Take 1 tablet (0.5 mg total) by mouth 2 (two) times daily as needed for anxiety.  . predniSONE (DELTASONE) 20 MG tablet Two pills once daily x 5 days; take after eating  . Prenatal Vit-Fe Fumarate-FA (PRENATAL COMPLETE) 14-0.4 MG TABS Take 1 tablet by mouth daily.  . promethazine  (PHENERGAN) 12.5 MG tablet Take 1 tablet (12.5 mg total) by mouth every 6 (six) hours as needed for nausea or vomiting.   No facility-administered encounter medications on file as of 07/11/2018.    An After Visit Summary was  printed and given to the patient.  Follow-up: Return in about 2 weeks (around 07/25/2018) for chest pain/palpitations.  Cain Saupeammie Layken Beg, MD

## 2018-07-11 NOTE — Progress Notes (Signed)
Per patient her chest has been hurting for about 1 week now. Heart racing out of no where. Per patient she would wake up in the morning and her heart would be racing, Per patient she went to the ER and they told her that is was Anxiety related. Per pt she is always fatigue,

## 2018-07-12 ENCOUNTER — Other Ambulatory Visit: Payer: Self-pay | Admitting: Family Medicine

## 2018-07-12 DIAGNOSIS — E559 Vitamin D deficiency, unspecified: Secondary | ICD-10-CM

## 2018-07-12 LAB — T4 AND TSH
T4, Total: 7.6 ug/dL (ref 4.5–12.0)
TSH: 0.57 u[IU]/mL (ref 0.450–4.500)

## 2018-07-12 LAB — VITAMIN D 25 HYDROXY (VIT D DEFICIENCY, FRACTURES): Vit D, 25-Hydroxy: 15.6 ng/mL — ABNORMAL LOW (ref 30.0–100.0)

## 2018-07-12 LAB — THYROID ANTIBODIES (THYROPEROXIDASE & THYROGLOBULIN)
Thyroglobulin Antibody: <1 [IU]/mL (ref 0.0–0.9)
Thyroperoxidase Ab SerPl-aCnc: 25 [IU]/mL (ref 0–34)

## 2018-07-12 LAB — SEDIMENTATION RATE: Sed Rate: 2 mm/h (ref 0–32)

## 2018-07-12 MED ORDER — VITAMIN D (ERGOCALCIFEROL) 1.25 MG (50000 UNIT) PO CAPS: ORAL_CAPSULE | ORAL | 2 refills | Status: DC

## 2018-07-12 NOTE — Progress Notes (Signed)
Patient ID: Sharon Waller, female   DOB: 12-04-1995, 23 y.o.   MRN: [id number]   Patient with Vitamin D deficiency on recent blood work and RX will be sent to her pharmacy for replacement Vitamin D therapy

## 2018-07-13 ENCOUNTER — Other Ambulatory Visit: Payer: Self-pay | Admitting: Family Medicine

## 2018-07-13 LAB — URINE CULTURE

## 2018-07-16 ENCOUNTER — Telehealth: Payer: Self-pay

## 2018-07-16 NOTE — Telephone Encounter (Signed)
Virtual Visit Pre-Appointment Phone Call  "(Sharon Waller), I am calling you today to discuss your upcoming appointment. We are currently trying to limit exposure to the virus that causes COVID-19 by seeing patients at home rather than in the office."  1. "What is the BEST phone number to call the day of the visit?" - include this in appointment notes  2. Do you have or have access to (through a family member/friend) a smartphone with video capability that we can use for your visit?" a. If yes - list this number in appt notes as cell (if different from BEST phone #) and list the appointment type as a VIDEO visit in appointment notes b. If no - list the appointment type as a PHONE visit in appointment notes  Confirm consent - "In the setting of the current Covid19 crisis, you are scheduled for a (phone or video) visit with your provider on (date) at (time).  Just as we do with many in-office visits, in order for you to participate in this visit, we must obtain consent.  If you'd like, I can send this to your mychart (if signed up) or email for you to review.  Otherwise, I can obtain your verbal consent now.  All virtual visits are billed to your insurance company just like a normal visit would be.  By agreeing to a virtual visit, we'd like you to understand that the technology does not allow for your provider to perform an examination, and thus may limit your provider's ability to fully assess your condition. If your provider identifies any concerns that need to be evaluated in person, we will make arrangements to do so.  Finally, though the technology is pretty good, we cannot assure that it will always work on either your or our end, and in the setting of a video visit, we may have to convert it to a phone-only visit.  In either situation, we cannot ensure that we have a secure connection.  Are you willing to proceed?"   YES/New pt/Telephone only/smartphone [phone]/verbal consent  5.4.20   3. Advise patient to be prepared - "Two hours prior to your appointment, go ahead and check your blood pressure, pulse, oxygen saturation, and your weight (if you have the equipment to check those) and write them all down. When your visit starts, your provider will ask you for this information. If you have an Apple Watch or Kardia device, please plan to have heart rate information ready on the day of your appointment. Please have a pen and paper handy nearby the day of the visit as well."  4. Give patient instructions for MyChart download to smartphone OR [email or web address] as below if video visit (depending on what platform provider is using)  5. Inform patient they will receive a phone call 15 minutes prior to their appointment time (may be from unknown caller ID) so they should be prepared to answer    TELEPHONE CALL NOTE  Avon I Hilda Bladesrmstrong has been deemed a candidate for a follow-up tele-health visit to limit community exposure during the Covid-19 pandemic. I spoke with the patient via phone to ensure availability of phone/video source, confirm preferred email & phone number, and discuss instructions and expectations.  I reminded Lucely I Spires to be prepared with any vital sign and/or heart rhythm information that could potentially be obtained via home monitoring, at the time of her visit. I reminded Lydian I Hagos to expect a phone call prior to her visit.  Jasmine DecemberSharon  B Ferguson 07/16/2018 3:30 PM   INSTRUCTIONS FOR DOWNLOADING THE MYCHART APP TO SMARTPHONE  - The patient must first make sure to have activated MyChart and know their login information - If Apple, go to [employer] and type in MyChart in the search bar and download the app. If Android, ask patient to go to [employer] and type in [place] in the search bar and download the app. The app is free but as with any other app downloads, their phone may require them to verify saved payment information or Apple/Android  password.  - The patient will need to then log into the app with their MyChart username and password, and select [facility] as their healthcare provider to link the account. When it is time for your visit, go to the MyChart app, find appointments, and click Begin Video Visit. Be sure to Select Allow for your device to access the Microphone and Camera for your visit. You will then be connected, and your provider will be with you shortly.  **If they have any issues connecting, or need assistance please contact MyChart service desk [phone])**  **If using a computer, in order to ensure the best quality for their visit they will need to use either of the following Internet Browsers: [employer] or Google Chrome**  IF USING DOXIMITY or DOXY.ME - The patient will receive a link just prior to their visit by text.     FULL LENGTH CONSENT FOR TELE-HEALTH VISIT   I hereby voluntarily request, consent and authorize [facility] and its employed or contracted physicians, physician assistants, nurse practitioners or other licensed health care professionals (the Practitioner), to provide me with telemedicine health care services (the Services") as deemed necessary by the treating Practitioner. I acknowledge and consent to receive the Services by the Practitioner via telemedicine. I understand that the telemedicine visit will involve communicating with the Practitioner through live audiovisual communication technology and the disclosure of certain medical information by electronic transmission. I acknowledge that I have been given the opportunity to request an in-person assessment or other available alternative prior to the telemedicine visit and am voluntarily participating in the telemedicine visit.  I understand that I have the right to withhold or withdraw my consent to the use of telemedicine in the course of my care at any time, without affecting my right to future care or treatment,  and that the Practitioner or I may terminate the telemedicine visit at any time. I understand that I have the right to inspect all information obtained and/or recorded in the course of the telemedicine visit and may receive copies of available information for a reasonable fee.  I understand that some of the potential risks of receiving the Services via telemedicine include:   Delay or interruption in medical evaluation due to technological equipment failure or disruption;  Information transmitted may not be sufficient (e.g. poor resolution of images) to allow for appropriate medical decision making by the Practitioner; and/or   In rare instances, security protocols could fail, causing a breach of personal health information.  Furthermore, I acknowledge that it is my responsibility to provide information about my medical history, conditions and care that is complete and accurate to the best of my ability. I acknowledge that Practitioner's advice, recommendations, and/or decision may be based on factors not within their control, such as incomplete or inaccurate data provided by me or distortions of diagnostic images or specimens that may result from electronic transmissions. I understand that the practice  of medicine is not an Chief Strategy Officer and that Practitioner makes no warranties or guarantees regarding treatment outcomes. I acknowledge that I will receive a copy of this consent concurrently upon execution via email to the email address I last provided but may also request a printed copy by calling the office of [facility].    I understand that my insurance will be billed for this visit.   I have read or had this consent read to me.  I understand the contents of this consent, which adequately explains the benefits and risks of the Services being provided via telemedicine.   I have been provided ample opportunity to ask questions regarding this consent and the Services and have had my questions  answered to my satisfaction.  I give my informed consent for the services to be provided through the use of telemedicine in my medical care  By participating in this telemedicine visit I agree to the above.

## 2018-07-17 ENCOUNTER — Other Ambulatory Visit: Payer: Self-pay

## 2018-07-17 ENCOUNTER — Encounter: Payer: Self-pay | Admitting: Cardiology

## 2018-07-17 ENCOUNTER — Telehealth (INDEPENDENT_AMBULATORY_CARE_PROVIDER_SITE_OTHER): Payer: Medicaid Other | Admitting: Cardiology

## 2018-07-17 VITALS — Ht 66.0 in | Wt 129.0 lb

## 2018-07-17 DIAGNOSIS — R0602 Shortness of breath: Secondary | ICD-10-CM | POA: Diagnosis not present

## 2018-07-17 DIAGNOSIS — R079 Chest pain, unspecified: Secondary | ICD-10-CM | POA: Insufficient documentation

## 2018-07-17 DIAGNOSIS — R0789 Other chest pain: Secondary | ICD-10-CM

## 2018-07-17 DIAGNOSIS — R002 Palpitations: Secondary | ICD-10-CM | POA: Insufficient documentation

## 2018-07-17 DIAGNOSIS — Z7189 Other specified counseling: Secondary | ICD-10-CM

## 2018-07-17 HISTORY — DX: Palpitations: R00.2

## 2018-07-17 HISTORY — DX: Chest pain, unspecified: R07.9

## 2018-07-17 HISTORY — DX: Shortness of breath: R06.02

## 2018-07-17 NOTE — Progress Notes (Signed)
Virtual Visit via Video Note   This visit type was conducted due to national recommendations for restrictions regarding the COVID-19 Pandemic (e.g. social distancing) in an effort to limit this patient's exposure and mitigate transmission in our community.  Due to her co-morbid illnesses, this patient is at least at moderate risk for complications without adequate follow up.  This format is felt to be most appropriate for this patient at this time.  All issues noted in this document were discussed and addressed.  A limited physical exam was performed with this format.  Please refer to the patient's chart for her consent to telehealth for [facility].   Evaluation Performed:  Cardiology consult  This visit type was conducted due to national recommendations for restrictions regarding the COVID-19 Pandemic (e.g. social distancing).  This format is felt to be most appropriate for this patient at this time.  All issues noted in this document were discussed and addressed.  No physical exam was performed (except for noted visual exam findings with Video Visits).  Please refer to the patient's chart (MyChart message for video visits and phone note for telephone visits) for the patient's consent to telehealth for [facility].  Date:  07/17/2018   ID:  Sharon Waller, DOB 07-11-1995, MRN [id number]  Patient Location:  [place]  Provider location:   [place]  PCP:  Antony Blackbird, MD  Cardiologist:  NEW Electrophysiologist:  None   Chief Complaint:  Palpitations/SOB  History of Present Illness:    Sharon Waller is a 23 y.o. female who presents via audio/video conferencing for a telehealth visit today in referral by Antony Blackbird, MD for evaluation of palpitations and SOB.   This is a 22yo AAF with a hx of asthma who was seen in the ER on 06/27/2018 with complaints of palpitations and SOB.  She says that the palpitations occur at random times and she also knows that her heart is speed up with  sudden movements such as standing up too quickly.    She had been seen in urgent care  and told that her symptoms were likely related to anxiety and was given Atarax as well as albuterol inhaler.  The Atarax made her feel sleepy.    She was seen in the ER 06/27/2018 and EKG showed NSR with inferior T wave abnormality.  She was given IVFs and Ativan with improvement in her sx.  She tells me that she does not feel her symptoms are related to anxiety.    She is also been complaining of some left-sided chest pain near her breastbone that was painful to touch and also painful with deep breaths.  She has been feeling fatigued as well.  She has had some shortness of breath but does not feel like it is been related to her asthma because she has had no cough, chest tightness or wheezing that she usually gets with her asthma.  She was seen recently by her PCP.  TSH and T4 were normal as well as CBC and electrolytes.  Her PCP felt that her chest wall pain was likely costochondritis.  She was started on some steroids for the pain and also encouraged to take nonsteroidals for a few days.  Her ESR was normal at 2.    She is now here for further evaluation. Her CP has completely resolved after taking NSAIDS.   She denies any PND, orthopnea, lower extremity edema, syncope.  She stil gets racing of her heart and SOB with minimal activity.  There is no family history of CAD.  She used to smoke cigarettes but quit almost 3 years ago.  The patient does not have symptoms concerning for COVID-19 infection (fever, chills, cough, or new shortness of breath).    Prior CV studies:   The following studies were reviewed today:  none  Past Medical History:  Diagnosis Date  . Abdominal pain affecting pregnancy, antepartum 09/26/2015   9w6don 09/23/15   . Allergic rhinitis 07/11/2018  . Asthma   . Bacterial vaginosis   . Bronchitis   . Choroid plexus cyst of fetus 01/07/2016  . Mild intermittent asthma 07/11/2018  . SVD  (spontaneous vaginal delivery) 05/04/2016  . UTI (lower urinary tract infection)    Past Surgical History:  Procedure Laterality Date  . NO PAST SURGERIES       Current Meds  Medication Sig  . Vitamin D, Ergocalciferol, (DRISDOL) 1.25 MG (50000 UT) CAPS capsule Take one pill once per week for a total of 12 weeks     Allergies:   Augmentin [amoxicillin-pot clavulanate]   Social History   Tobacco Use  . Smoking status: Former Smoker    Types: Cigarettes    Last attempt to quit: 09/30/2015    Years since quitting: 2.7  . Smokeless tobacco: Never Used  Substance Use Topics  . Alcohol use: No  . Drug use: No     Family Hx: The patient's family history includes Breast cancer in her maternal grandmother; Cancer in her maternal aunt and maternal grandmother.  ROS:   Please see the history of present illness.     All other systems reviewed and are negative.   Labs/Other Tests and Data Reviewed:    Recent Labs: 06/27/2018: ALT 13; BUN 8; Creatinine, Ser 0.59; Hemoglobin 12.8; Platelets 268; Potassium 3.5; Sodium 138 07/11/2018: TSH 0.570   Recent Lipid Panel No results found for: CHOL, TRIG, HDL, CHOLHDL, LDLCALC, LDLDIRECT  Wt Readings from Last 3 Encounters:  07/17/18 129 lb (58.5 kg)  07/11/18 128 lb 8 oz (58.3 kg)  02/13/18 126 lb 8 oz (57.4 kg)     Objective:    Vital Signs:  Ht _0  (1.676 m)   Wt 129 lb (58.5 kg)   LMP 12/16/2017 (Exact Date)   BMI 20.82 kg/m    CONSTITUTIONAL:  Well nourished, well developed female in no acute distress.  EYES: anicteric MOUTH: oral mucosa is pink RESPIRATORY: Normal respiratory effort, symmetric expansion CARDIOVASCULAR: No peripheral edema SKIN: No rash, lesions or ulcers MUSCULOSKELETAL: no digital cyanosis NEURO: Cranial Nerves II-XII grossly intact, moves all extremities PSYCH: Intact judgement and insight.  A&O x 3, Mood/affect appropriate   ASSESSMENT & PLAN:    1.  Chest pain - her CP is very atypical and  reproduced with palpation on exam by PCP and with movement.  It has completely resolved after treatment with NSAIDs.  ESR was normal. This is unlikely to be ischemic in origin given young age and only CRF being remote hx of tobacco but she does have an abnormal EKG with diffuse T wave abnormality.  Other dx on differential include pericarditis but hx not c/w with that and sed rate was normal.  Will await results of 2D echo to assess wall motion prior to any further workup.    2.  Palpitations - unclear etiology of palpitations.  They occur at any time when laying down or sitting or walking.  I will get a long term Zio patch to assess for arrhythmias. TSH  was normal a few weeks ago.  Pregnancy test negative.  3.  SOB - unclear etiology of SOB but it occurs with minimal exertion.  She has no LE edema.  Hbg was normal and TSH in ER as well as cxray. Ddimer was normal 06/27/2018 in ER. I will get a 2D echo to assess LVF.   4.  COVID-19 Education:The signs and symptoms of COVID-19 were discussed with the patient and how to seek care for testing (follow up with PCP or arrange E-visit).  The importance of social distancing was discussed today.  Patient Risk:   After full review of this patient's clinical status, I feel that they are at least moderate risk at this time.  Time:   Today, I have spent 20 minutes directly with the patient on video discussing medical problems including palpitations, CP and SOB.  We also reviewed the symptoms of COVID 19 and the ways to protect against contracting the virus with telehealth technology.  I spent an additional 5 minutes reviewing patient's chart including office notes.  Medication Adjustments/Labs and Tests Ordered: Current medicines are reviewed at length with the patient today.  Concerns regarding medicines are outlined above.  Tests Ordered: Orders Placed This Encounter  Procedures  . LONG TERM MONITOR (3-14 DAYS)  . ECHOCARDIOGRAM COMPLETE   Medication  Changes: No orders of the defined types were placed in this encounter.   Disposition:  Follow up prn  Signed, Fransico Him, MD  07/17/2018 9:08 PM    [place]

## 2018-07-17 NOTE — Patient Instructions (Signed)
Medication Instructions:  Your physician recommends that you continue on your current medications as directed. Please refer to the Current Medication list given to you today.  If you need a refill on your cardiac medications before your next appointment, please call your pharmacy.   Lab work: None If you have labs (blood work) drawn today and your tests are completely normal, you will receive your results only by: Marland Kitchen MyChart Message (if you have MyChart) OR . A paper copy in the mail If you have any lab test that is abnormal or we need to change your treatment, we will call you to review the results.  Testing/Procedures: Your physician has requested that you have an echocardiogram. Echocardiography is a painless test that uses sound waves to create images of your heart. It provides your doctor with information about the size and shape of your heart and how well your heart's chambers and valves are working. This procedure takes approximately one hour. There are no restrictions for this procedure.  Schedule a Zio Patch, 14 day monitor  Follow-Up: As needed, pending results.

## 2018-07-18 ENCOUNTER — Telehealth: Payer: Self-pay | Admitting: Radiology

## 2018-07-18 NOTE — Telephone Encounter (Signed)
Enrolled patient for a 14 Day Zio Long Term monitor to be mailed ude to covid-19. Brief instructions were gone over with the patient and she knows to expect the monitor to arrive in 3-4 days

## 2018-07-19 ENCOUNTER — Other Ambulatory Visit: Payer: Self-pay

## 2018-07-19 ENCOUNTER — Ambulatory Visit (HOSPITAL_COMMUNITY): Payer: Medicaid Other | Attending: Cardiology

## 2018-07-19 DIAGNOSIS — R0602 Shortness of breath: Secondary | ICD-10-CM | POA: Insufficient documentation

## 2018-07-23 ENCOUNTER — Other Ambulatory Visit: Payer: Self-pay

## 2018-07-23 ENCOUNTER — Ambulatory Visit
Admission: RE | Admit: 2018-07-23 | Discharge: 2018-07-23 | Disposition: A | Discharge status: Home / Self Care | Payer: Medicaid Other | Source: Ambulatory Visit | Attending: Family Medicine | Admitting: Family Medicine

## 2018-07-23 DIAGNOSIS — N644 Mastodynia: Secondary | ICD-10-CM

## 2018-07-25 ENCOUNTER — Encounter: Payer: Self-pay | Admitting: Family Medicine

## 2018-07-25 ENCOUNTER — Other Ambulatory Visit: Payer: Self-pay

## 2018-07-25 ENCOUNTER — Ambulatory Visit: Payer: Medicaid Other | Attending: Family Medicine | Admitting: Family Medicine

## 2018-07-25 VITALS — BP 115/81 | HR 72 | Temp 98.2°F | Ht 66.0 in | Wt 128.0 lb

## 2018-07-25 DIAGNOSIS — R002 Palpitations: Secondary | ICD-10-CM

## 2018-07-25 DIAGNOSIS — N3000 Acute cystitis without hematuria: Secondary | ICD-10-CM | POA: Diagnosis not present

## 2018-07-25 DIAGNOSIS — R51 Headache: Secondary | ICD-10-CM

## 2018-07-25 DIAGNOSIS — R519 Headache, unspecified: Secondary | ICD-10-CM

## 2018-07-25 DIAGNOSIS — R350 Frequency of micturition: Secondary | ICD-10-CM

## 2018-07-25 LAB — POCT URINALYSIS DIP (CLINITEK)
Bilirubin, UA: NEGATIVE
Blood, UA: NEGATIVE
Glucose, UA: NEGATIVE mg/dL
Ketones, POC UA: NEGATIVE mg/dL
Nitrite, UA: NEGATIVE
POC PROTEIN,UA: NEGATIVE
Spec Grav, UA: 1.02
Urobilinogen, UA: 0.2 U/dL
pH, UA: 7

## 2018-07-25 MED ORDER — LORATADINE 10 MG PO TABS: 10.0000 mg | ORAL_TABLET | Freq: Every day | ORAL | 11 refills | Status: DC

## 2018-07-25 MED ORDER — SULFAMETHOXAZOLE-TRIMETHOPRIM 800-160 MG PO TABS: 1.0000 | ORAL_TABLET | Freq: Two times a day (BID) | ORAL | 0 refills | Status: AC

## 2018-07-25 NOTE — Progress Notes (Signed)
Palpitation and urinating a lot and not chest pain, Per pt she is dizzy. Per pt she noticed she have not been sick until she moved into her new house

## 2018-07-25 NOTE — Progress Notes (Signed)
Established Patient Office Visit  Subjective:  Patient ID: Sharon Waller, female    DOB: [birth year]  Age: 23 y.o. MRN: [id number]  CC: No chief complaint on file.   HPI Sharon I [name] presents for follow-up of medical issues from her recent new patient appointment.  She completed the prednisone prescribed at her last visit as well as took nonsteroidal anti-inflammatories for a few days and she reports that her sensation of chest tightness and left chest wall pain have subsided.  She also had an ultrasound of the left breast which revealed no significant abnormalities.  She still has some mild bilateral breast tenderness.  She continues to have some fatigue.  She states that she feels as if she has some type of problem or infection that just has not yet been discovered.        She continues to have some issues with sensation of an abnormal heart rate.  She did have a telephone visit with the cardiologist and is currently wearing a heart monitor.  She feels that her palpitations have improved since her last visit.  She denies any fever or chills.  No shortness of breath or cough.  No wheezing or chest tightness associated with her asthma which she continues to feel as well controlled.  She has noticed a recurrent frontal headache.  She does not take any allergy medication currently.  She does have some mild nasal congestion.  She has not had any dizziness associated with her frontal headache.  She does feel as if she urinates frequently.  She denies any abdominal pain and does not feel as if she is having any dysuria.  Past Medical History:  Diagnosis Date  . Abdominal pain affecting pregnancy, antepartum 09/26/2015   [id number] on 09/23/15   . Allergic rhinitis 07/11/2018  . Asthma   . Bacterial vaginosis   . Bronchitis   . Choroid plexus cyst of fetus 01/07/2016  . Mild intermittent asthma 07/11/2018  . SVD (spontaneous vaginal delivery) 05/04/2016  . UTI (lower urinary tract infection)      Past Surgical History:  Procedure Laterality Date  . NO PAST SURGERIES      Family History  Problem Relation Age of Onset  . Cancer Maternal Aunt   . Cancer Maternal Grandmother   . Breast cancer Maternal Grandmother     Social History   Tobacco Use  . Smoking status: Former Smoker    Types: Cigarettes    Last attempt to quit: 09/30/2015    Years since quitting: 2.8  . Smokeless tobacco: Never Used  Substance Use Topics  . Alcohol use: No  . Drug use: No    Outpatient Medications Prior to Visit  Medication Sig Dispense Refill  . Vitamin D, Ergocalciferol, (DRISDOL) 1.25 MG (50000 UT) CAPS capsule Take one pill once per week for a total of 12 weeks 4 capsule 2   No facility-administered medications prior to visit.     Social History   Tobacco Use  . Smoking status: Former Smoker    Types: Cigarettes    Last attempt to quit: 09/30/2015    Years since quitting: 2.8  . Smokeless tobacco: Never Used  Substance Use Topics  . Alcohol use: No  . Drug use: No   ROS Review of Systems  Constitutional: Positive for fatigue. Negative for chills and fever.  HENT: Positive for congestion and rhinorrhea (mild, clear). Negative for ear pain.   Eyes: Negative for photophobia and visual disturbance.  Respiratory: Negative  for cough, chest tightness and shortness of breath.   Cardiovascular: Positive for palpitations. Negative for chest pain and leg swelling.  Gastrointestinal: Negative for abdominal pain, constipation, diarrhea and nausea.  Endocrine: Negative for cold intolerance, heat intolerance, polydipsia, polyphagia and polyuria.  Genitourinary: Positive for frequency. Negative for dysuria.  Musculoskeletal: Negative for arthralgias and joint swelling.  Neurological: Positive for headaches. Negative for dizziness.  Hematological: Negative for adenopathy. Does not bruise/bleed easily.      Objective:    Physical Exam  Constitutional: She is oriented to person,  place, and time. She appears well-developed and well-nourished.  HENT:  Head: Normocephalic and atraumatic.  Right Ear: Hearing, tympanic membrane, external ear and ear canal normal.  Left Ear: Hearing, tympanic membrane, external ear and ear canal normal.  Nose: Mucosal edema and rhinorrhea (scant clear nasal congestion) present. Right sinus exhibits no maxillary sinus tenderness and no frontal sinus tenderness. Left sinus exhibits no maxillary sinus tenderness and no frontal sinus tenderness.  Mouth/Throat: Posterior oropharyngeal erythema (mild) present. No posterior oropharyngeal edema.  Neck: Normal range of motion. Neck supple. No thyromegaly present.  Cardiovascular: Normal rate and regular rhythm.  Pulmonary/Chest: Effort normal and breath sounds normal. She has no wheezes. She exhibits no tenderness.  Abdominal: Soft. There is abdominal tenderness (mild suprapubic discomfort to palpation). There is no rebound and no guarding.  No CVA tenderness  Musculoskeletal:        General: No tenderness or edema.  Lymphadenopathy:    She has no cervical adenopathy.  Neurological: She is alert and oriented to person, place, and time.  Skin: Skin is warm and dry.  Psychiatric: She has a normal mood and affect. Her behavior is normal.  Nursing note and vitals reviewed.   BP 115/81 (BP Location: Right Arm, Patient Position: Sitting, Cuff Size: Normal)   Pulse 72   Temp 98.2 F (36.8 C) (Oral)   Ht  (1.676 m)   Wt 128 lb (58.1 kg)   LMP 07/13/2018 (Exact Date)   SpO2 99%   BMI 20.66 kg/m  Wt Readings from Last 3 Encounters:  07/25/18 128 lb (58.1 kg)  07/17/18 129 lb (58.5 kg)  07/11/18 128 lb 8 oz (58.3 kg)     Lab Results  Component Value Date   TSH 0.570 07/11/2018   Lab Results  Component Value Date   WBC 4.9 06/27/2018   HGB 12.8 06/27/2018   HCT 40.6 06/27/2018   MCV 92.9 06/27/2018   PLT 268 06/27/2018   Lab Results  Component Value Date   NA 138 06/27/2018    K 3.5 06/27/2018   CO2 26 06/27/2018   GLUCOSE 89 06/27/2018   BUN 8 06/27/2018   CREATININE 0.59 06/27/2018   BILITOT 0.6 06/27/2018   ALKPHOS 47 06/27/2018   AST 17 06/27/2018   ALT 13 06/27/2018   PROT 7.6 06/27/2018   ALBUMIN 4.1 06/27/2018   CALCIUM 9.3 06/27/2018   ANIONGAP 8 06/27/2018      Assessment & Plan:  1. Urinary frequency Patient with complaint of some urinary frequency but denies dysuria.  Patient has not felt as if she is having abdominal pain however on examination, patient with suprapubic discomfort to palpation.  Will check urinalysis to look for possible urinary tract infection.  Patient denies any possibility of pregnancy. - POCT URINALYSIS DIP (CLINITEK)  2. Frontal headache Patient with frontal headache but also has history of allergic rhinitis.  Prescription provided for patient to try loratadine 10 mg daily.  She may wish to take over-the-counter Tylenol or naproxen and use cool compresses to her forehead however she should call or return to clinic of her headaches do not improve or worsen - loratadine (CLARITIN) 10 MG tablet; Take 1 tablet (10 mg total) by mouth daily. To help with headache/congestion  Dispense: 30 tablet; Refill: 11  3. Palpitation Patient's cardiology note is been reviewed and patient with normal electrolytes from her last visit.  She is wearing a cardiac monitor at today's visit.  She will continue cardiology follow-up  4. Acute cystitis without hematuria Patient with 1+ leukocytes on urinalysis and suprapubic discomfort on exam.  Prescription provided for Septra DS twice daily x3 days for treatment of cystitis and urine will be sent for culture.  She will be notified if change in therapy is needed based on the culture results.  She is encouraged to increase water intake. - sulfamethoxazole-trimethoprim (BACTRIM DS) 800-160 MG tablet; Take 1 tablet by mouth 2 (two) times daily for 3 days.  Dispense: 6 tablet; Refill: 0   Allergies as of  07/25/2018      Reactions   Augmentin [amoxicillin-pot Clavulanate] Rash   Has patient had a PCN reaction causing immediate rash, facial/tongue/throat swelling, SOB or lightheadedness with hypotension: Yes Has patient had a PCN reaction causing severe rash involving mucus membranes or skin necrosis: No Has patient had a PCN reaction that required hospitalization Yes Has patient had a PCN reaction occurring within the last 10 years: No If all of the above answers are "NO", then may proceed with Cephalosporin use.      Medication List       Accurate as of Jul 25, 2018 11:59 PM. If you have any questions, ask your nurse or doctor.        loratadine 10 MG tablet Commonly known as:  CLARITIN Take 1 tablet (10 mg total) by mouth daily. To help with headache/congestion Started by:  Cain Saupe, MD   sulfamethoxazole-trimethoprim 800-160 MG tablet Commonly known as:  Bactrim DS Take 1 tablet by mouth 2 (two) times daily for 3 days. Started by:  Cain Saupe, MD   Vitamin D (Ergocalciferol) 1.25 MG (50000 UT) Caps capsule Commonly known as:  DRISDOL Take one pill once per week for a total of 12 weeks      An After Visit Summary was printed and given to the patient.   Follow-up: Return in about 3 weeks (around 08/15/2018) for follow-up in 2-3 weeks if continued headache.   Cain Saupe, MD

## 2018-07-25 NOTE — Patient Instructions (Signed)

## 2018-07-26 ENCOUNTER — Ambulatory Visit (INDEPENDENT_AMBULATORY_CARE_PROVIDER_SITE_OTHER): Payer: Medicaid Other

## 2018-07-26 DIAGNOSIS — R002 Palpitations: Secondary | ICD-10-CM

## 2018-08-01 ENCOUNTER — Other Ambulatory Visit: Payer: Self-pay

## 2018-08-01 ENCOUNTER — Ambulatory Visit: Payer: Medicaid Other | Attending: Family Medicine | Admitting: Family Medicine

## 2018-08-01 ENCOUNTER — Encounter: Payer: Self-pay | Admitting: Family Medicine

## 2018-08-01 VITALS — BP 111/74 | HR 80 | Temp 98.5°F | Ht 66.0 in | Wt 130.0 lb

## 2018-08-01 DIAGNOSIS — R002 Palpitations: Secondary | ICD-10-CM | POA: Diagnosis not present

## 2018-08-01 DIAGNOSIS — J4520 Mild intermittent asthma, uncomplicated: Secondary | ICD-10-CM | POA: Diagnosis not present

## 2018-08-01 DIAGNOSIS — R5383 Other fatigue: Secondary | ICD-10-CM

## 2018-08-01 DIAGNOSIS — Z77120 Contact with and (suspected) exposure to mold (toxic): Secondary | ICD-10-CM

## 2018-08-01 DIAGNOSIS — L209 Atopic dermatitis, unspecified: Secondary | ICD-10-CM

## 2018-08-01 DIAGNOSIS — J309 Allergic rhinitis, unspecified: Secondary | ICD-10-CM | POA: Diagnosis not present

## 2018-08-01 DIAGNOSIS — H6121 Impacted cerumen, right ear: Secondary | ICD-10-CM

## 2018-08-01 MED ORDER — TRIAMCINOLONE ACETONIDE 0.1 % EX CREA: 1.0000 "application " | TOPICAL_CREAM | Freq: Two times a day (BID) | CUTANEOUS | 11 refills | Status: DC

## 2018-08-01 MED FILL — TRIAMCINOLONE ACETONIDE 0.1: 0.1 | 15 days supply | Qty: 30 | Fill #0

## 2018-08-01 NOTE — Progress Notes (Signed)
Per pt she have rash on her upper shoulder blade on both side.Patient said that they did a mold test in her appt and they think it's mold but they are sending it off for more testing and may have to move out. She said she needs testing because this could be why she is feeling the way she is and why she keeps getting sick.

## 2018-08-01 NOTE — Patient Instructions (Addendum)
Please obtain over-the-counter Debrox to use in the right ear to reduce wax buildup.  Please call or return or if you would like to have irrigation of your ear to help with wax removal or if you continue to have difficulty with your hearing.   Atopic Dermatitis Atopic dermatitis is a skin disorder that causes inflammation of the skin. This is the most common type of eczema. Eczema is a group of skin conditions that cause the skin to be itchy, red, and swollen. This condition is generally worse during the cooler winter months and often improves during the warm summer months. Symptoms can vary from person to person. Atopic dermatitis usually starts showing signs in infancy and can last through adulthood. This condition cannot be passed from one person to another (non-contagious), but it is more common in families. Atopic dermatitis may not always be present. When it is present, it is called a flare-up. What are the causes? The exact cause of this condition is not known. Flare-ups of the condition may be triggered by:  Contact with something that you are sensitive or allergic to.  Stress.  Certain foods.  Extremely hot or cold weather.  Harsh chemicals and soaps.  Dry air.  Chlorine. What increases the risk? This condition is more likely to develop in people who have a personal history or family history of eczema, allergies, asthma, or hay fever. What are the signs or symptoms? Symptoms of this condition include:  Dry, scaly skin.  Red, itchy rash.  Itchiness, which can be severe. This may occur before the skin rash. This can make sleeping difficult.  Skin thickening and cracking that can occur over time. How is this diagnosed? This condition is diagnosed based on your symptoms, a medical history, and a physical exam. How is this treated? There is no cure for this condition, but symptoms can usually be controlled. Treatment focuses on:  Controlling the itchiness and scratching.  You may be given medicines, such as antihistamines or steroid creams.  Limiting exposure to things that you are sensitive or allergic to (allergens).  Recognizing situations that cause stress and developing a plan to manage stress. If your atopic dermatitis does not get better with medicines, or if it is all over your body (widespread), a treatment using a specific type of light (phototherapy) may be used. Follow these instructions at home: Skin care   Keep your skin well-moisturized. Doing this seals in moisture and helps to prevent dryness. ? Use unscented lotions that have petroleum in them. ? Avoid lotions that contain alcohol or water. They can dry the skin.  Keep baths or showers short (less than 5 minutes) in warm water. Do not use hot water. ? Use mild, unscented cleansers for bathing. Avoid soap and bubble bath. ? Apply a moisturizer to your skin right after a bath or shower.  Do not apply anything to your skin without checking with your health care provider. General instructions  Dress in clothes made of cotton or cotton blends. Dress lightly because heat increases itchiness.  When washing your clothes, rinse your clothes twice so all of the soap is removed.  Avoid any triggers that can cause a flare-up.  Try to manage your stress.  Keep your fingernails cut short.  Avoid scratching. Scratching makes the rash and itchiness worse. It may also result in a skin infection (impetigo) due to a break in the skin caused by scratching.  Take or apply over-the-counter and prescription medicines only as told by your  health care provider.  Keep all follow-up visits as told by your health care provider. This is important.  Do not be around people who have cold sores or fever blisters. If you get the infection, it may cause your atopic dermatitis to worsen. Contact a health care provider if:  Your itchiness interferes with sleep.  Your rash gets worse or it is not better within  one week of starting treatment.  You have a fever.  You have a rash flare-up after having contact with someone who has cold sores or fever blisters. Get help right away if:  You develop pus or soft yellow scabs in the rash area. Summary  This condition causes a red rash and itchy, dry, scaly skin.  Treatment focuses on controlling the itchiness and scratching, limiting exposure to things that you are sensitive or allergic to (allergens), recognizing situations that cause stress, and developing a plan to manage stress.  Keep your skin well-moisturized.  Keep baths or showers shorter than 5 minutes and use warm water. Do not use hot water. This information is not intended to replace advice given to you by your health care provider. Make sure you discuss any questions you have with your health care provider. Document Released: 02/26/2000 Document Revised: 04/01/2016 Document Reviewed: 04/01/2016 Elsevier Interactive Patient Education  2019 [employer]
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
## 2018-08-03 LAB — ALLERGEN PROFILE, MOLD
Alternaria Alternata IgE: <0.1 kU/L
Aspergillus Fumigatus IgE: <0.1 kU/L
Aureobasidi Pullulans IgE: <0.1 kU/L
Candida Albicans IgE: <0.1 kU/L
Cladosporium Herbarum IgE: <0.1 kU/L
M009-IgE Fusarium proliferatum: <0.1 kU/L
M014-IgE Epicoccum purpur: <0.1 kU/L
Mucor Racemosus IgE: <0.1 kU/L
Penicillium Chrysogen IgE: <0.1 kU/L
Phoma Betae IgE: <0.1 kU/L
Setomelanomma Rostrat: <0.1 kU/L
Stemphylium Herbarum IgE: <0.1 kU/L

## 2018-08-08 ENCOUNTER — Other Ambulatory Visit: Payer: Self-pay

## 2018-08-08 ENCOUNTER — Ambulatory Visit (INDEPENDENT_AMBULATORY_CARE_PROVIDER_SITE_OTHER): Payer: Medicaid Other | Admitting: Allergy

## 2018-08-08 ENCOUNTER — Encounter: Payer: Self-pay | Admitting: Allergy

## 2018-08-08 VITALS — BP 112/78 | HR 77 | Temp 98.8°F | Resp 16 | Ht 67.5 in | Wt 131.8 lb

## 2018-08-08 DIAGNOSIS — J4540 Moderate persistent asthma, uncomplicated: Secondary | ICD-10-CM | POA: Diagnosis not present

## 2018-08-08 DIAGNOSIS — J3089 Other allergic rhinitis: Secondary | ICD-10-CM | POA: Diagnosis not present

## 2018-08-08 DIAGNOSIS — R002 Palpitations: Secondary | ICD-10-CM | POA: Diagnosis not present

## 2018-08-08 DIAGNOSIS — L2089 Other atopic dermatitis: Secondary | ICD-10-CM

## 2018-08-08 MED ORDER — LEVALBUTEROL TARTRATE 45 MCG/ACT IN AERO: 2.0000 | INHALATION_SPRAY | RESPIRATORY_TRACT | 1 refills | Status: DC | PRN

## 2018-08-08 MED ORDER — FLUTICASONE PROPIONATE HFA 110 MCG/ACT IN AERO: 2.0000 | INHALATION_SPRAY | Freq: Two times a day (BID) | RESPIRATORY_TRACT | 5 refills | Status: DC

## 2018-08-08 MED ORDER — CETIRIZINE HCL 10 MG PO TABS: 10.0000 mg | ORAL_TABLET | Freq: Every day | ORAL | 5 refills | Status: DC

## 2018-08-08 NOTE — Patient Instructions (Addendum)
-   stop albuterol and change to Xopenex due to ongoing issues with palpitations.  Albuterol use could be contributing factor.   Use Xopenex inhaler 2 puffs every 4-6 hours as needed for cough/wheeze/shortness of breath/chest tightness.  May use 15-20 minutes prior to activity.   Monitor frequency of use.   - for control of respiratory symptoms recommend starting Flovent 2 puffs twice a day - Control goals:   Full participation in all desired activities (may need albuterol before activity)  Albuterol use two time or less a week on average (not counting use with activity)  Cough interfering with sleep two time or less a month  Oral steroids no more than once a year  No hospitalizations  - environmental allergy skin testing is positive to molds, dust mites, cockroach and weed pollen - allergen avoidance measures discussed/handouts provided - for general allergy symptom relief take long-acting antihistamine Xyzal 5mg , Allegra 180mg  or Zyrtec 10mg  daily as needed   - agree with use of triamcinolone twice a day application until rash improves  - daily moisturization especially after bathing  Follow-up 3-4 months or sooner if needed
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
## 2018-08-13 ENCOUNTER — Telehealth: Payer: Self-pay | Admitting: *Deleted

## 2018-08-13 NOTE — Telephone Encounter (Signed)
PA for levalbuterol hfa done in [facility] but it states suspended will check back at later time

## 2018-08-14 NOTE — Telephone Encounter (Signed)
PA approved for Xopenex HFA in [facility] faxed to pharmacy. Writer called patient and advised this was approved and she can contact the pharmacy to refill patient verbalized understanding

## 2018-08-21 ENCOUNTER — Other Ambulatory Visit: Payer: Self-pay

## 2018-09-07 ENCOUNTER — Telehealth: Payer: Self-pay | Admitting: Cardiology

## 2018-09-07 DIAGNOSIS — I471 Supraventricular tachycardia: Secondary | ICD-10-CM

## 2018-09-07 NOTE — Telephone Encounter (Signed)
The results are in the system but not resulted yet.

## 2018-09-07 NOTE — Telephone Encounter (Signed)
Patient would like to know the results of the monitor that she wore. She states it has been some time since she wore it. She was hoping the office had results for her by now. If the office could all her when the results come in she would appreciate it

## 2018-09-10 ENCOUNTER — Ambulatory Visit: Payer: Medicaid Other | Admitting: Family Medicine

## 2018-09-10 NOTE — Telephone Encounter (Signed)
Spoke with the patient, she expressed understanding about the results and agreed to the EP referral.

## 2018-09-10 NOTE — Telephone Encounter (Signed)
Notes recorded by Dorothy Spark, MD on 09/08/2018 at 2:54 PM EDT  Bradycardia to sinus tachycardia. 29 Supraventricular Tachycardias the longest lasting 12 seconds which rate 182 bpm. This possibly represent atrial tachycardia, consider referral to EP.

## 2018-09-10 NOTE — Telephone Encounter (Signed)
Spoke with the patient, she is scheduled for Dr. Rayann Heman on 09/12/18. She agreed to a video visit.

## 2018-09-10 NOTE — Telephone Encounter (Signed)
  Patient is calling to get results from monitor and to see what needs to happen next

## 2018-09-11 ENCOUNTER — Telehealth: Payer: Self-pay

## 2018-09-11 NOTE — Telephone Encounter (Signed)
Spoke with pt regarding appt on 09/12/18. Pt stated she does not have a way to check vitals prior to appt. Pt questions and concerns were address.

## 2018-09-12 ENCOUNTER — Telehealth (INDEPENDENT_AMBULATORY_CARE_PROVIDER_SITE_OTHER): Payer: Medicaid Other | Admitting: Internal Medicine

## 2018-09-12 ENCOUNTER — Encounter: Payer: Self-pay | Admitting: Internal Medicine

## 2018-09-12 VITALS — Ht 67.5 in | Wt 131.0 lb

## 2018-09-12 DIAGNOSIS — R002 Palpitations: Secondary | ICD-10-CM | POA: Diagnosis not present

## 2018-09-12 DIAGNOSIS — I471 Supraventricular tachycardia: Secondary | ICD-10-CM | POA: Diagnosis not present

## 2018-09-12 MED ORDER — DILTIAZEM HCL ER COATED BEADS 120 MG PO CP24: 120.0000 mg | ORAL_CAPSULE | Freq: Every day | ORAL | 2 refills | Status: DC

## 2018-09-12 NOTE — Progress Notes (Signed)
Electrophysiology TeleHealth Note   Due to national recommendations of social distancing due to [place] 19, Audio/video telehealth visit is felt to be most appropriate for this patient at this time.  See MyChart message from today for patient consent regarding telehealth for [facility].   Date:  09/12/2018   ID:  Sharon Waller, DOB [birth year], MRN [id number]  Location: home Provider location: [address], [place] Evaluation Performed: New patient consult  PCP:  Antony Blackbird, MD  Cardiologist:   Dr Radford Pax Electrophysiologist:  None   Chief Complaint:  palpitations  History of Present Illness:    Sharon Waller is a 23 y.o. female who presents via audio/video conferencing for a telehealth visit today.   The patient is referred for new consultation regarding palpitations by Dr Radford Pax.  She has rare and short palpitations.  She reports associated SOB.  She is active with small children.  She recently wore an event monitor which documented primarily sinus rhythm and sinus tachy. She may have had very short and infrequent atach events.    Today, she denies symptoms of chest pain, shortness of breath, orthopnea, PND, lower extremity edema, claudication, dizziness, presyncope, syncope, bleeding, or neurologic sequela. The patient is tolerating medications without difficulties and is otherwise without complaint today.   she denies symptoms of cough, fevers, chills, or new SOB worrisome for COVID 19.   Past Medical History:  Diagnosis Date  . Abdominal pain affecting pregnancy, antepartum 09/26/2015   [redacted]w[redacted]d on 09/23/15   . Allergic rhinitis 07/11/2018  . Asthma   . Bacterial vaginosis   . Bronchitis   . Choroid plexus cyst of fetus 01/07/2016  . Mild intermittent asthma 07/11/2018  . SVD (spontaneous vaginal delivery) 05/04/2016  . UTI (lower urinary tract infection)     Past Surgical History:  Procedure Laterality Date  . NO PAST SURGERIES      Current  Outpatient Medications  Medication Sig Dispense Refill  . cetirizine (ZYRTEC) 10 MG tablet Take 1 tablet (10 mg total) by mouth daily. 30 tablet 5  . fluticasone (FLOVENT HFA) 110 MCG/ACT inhaler Inhale 2 puffs into the lungs 2 (two) times daily. 1 Inhaler 5  . levalbuterol (XOPENEX HFA) 45 MCG/ACT inhaler Inhale 2 puffs into the lungs every 4 (four) hours as needed. 1 Inhaler 1  . loratadine (CLARITIN) 10 MG tablet Take 1 tablet (10 mg total) by mouth daily. To help with headache/congestion 30 tablet 11  . triamcinolone cream (KENALOG) 0.1 % Apply 1 application topically 2 (two) times daily. For 10 days then as needed 30 g 11  . Vitamin D, Ergocalciferol, (DRISDOL) 1.25 MG (50000 UT) CAPS capsule Take one pill once per week for a total of 12 weeks 4 capsule 2   No current facility-administered medications for this visit.     Allergies:   Augmentin [amoxicillin-pot clavulanate]   Social History:  The patient  reports that she quit smoking about 2 years ago. Her smoking use included cigarettes. She has never used smokeless tobacco. She reports that she does not drink alcohol or use drugs.   Family History:  The patient's family history includes Allergic rhinitis in her father, paternal grandfather, and paternal grandmother; Asthma in her brother; Breast cancer in her maternal grandmother; Cancer in her maternal aunt and maternal grandmother.    ROS:  Please see the history of present illness.   All other systems are personally reviewed and negative.    Exam:  Vital Signs:  Ht 5' 7.5" (1.715 m)   Wt 131 lb (59.4 kg)   LMP 12/16/2017 (Exact Date)   BMI 20.21 kg/m   Well appearing, alert and conversant, regular work of breathing,  good skin color Eyes- anicteric, neuro- grossly intact, skin- no apparent rash or lesions or cyanosis, mouth- oral mucosa is pink   Labs/Other Tests and Data Reviewed:    Recent Labs: 06/27/2018: ALT 13; BUN 8; Creatinine, Ser 0.59; Hemoglobin 12.8;  Platelets 268; Potassium 3.5; Sodium 138 07/11/2018: TSH 0.570   Wt Readings from Last 3 Encounters:  09/12/18 131 lb (59.4 kg)  08/08/18 131 lb 12.8 oz (59.8 kg)  08/01/18 130 lb (59 kg)     Other studies personally reviewed: Additional studies/ records that were reviewed today include: recent monitor, prior echo  Review of the above records today demonstrates: as above  ASSESSMENT & PLAN:    1.  Palpitations Unclear etiology Benign monitor results noted Appears to be primarily due to sinus tach.  Very rare and infrequent atach is also noted.  This is not amenable to ablation and is not likely clinically significant. I will try low dose diltiazem CD 120mg  daily. We discussed lifestyle modification at length including regular exercise, ETOH caffeine avoidance, stress management and adequate hydration.   Follow-up:  EP APP in 6-8 weeks  Current medicines are reviewed at length with the patient today.   The patient does not have concerns regarding her medicines.  The following changes were made today:  none  Labs/ tests ordered today include:  No orders of the defined types were placed in this encounter.   Patient Risk:  after full review of this patients clinical status, I feel that they are at moderate risk at this time.   Today, I have spent 20 minutes with the patient with telehealth technology discussing palpitations .    SignedHillis Range, Bartholomew Ramesh MD, [facility] ArizonaFACC [facility]icsFHRS 09/12/2018 11:32 AM   [facility] [address] [place] [address] [phone] (office) [phone] (fax)

## 2018-10-04 ENCOUNTER — Telehealth: Payer: Self-pay | Admitting: Internal Medicine

## 2018-10-04 NOTE — Telephone Encounter (Signed)
Returned call to pt.  Per Pt she continues to have palpitations, she states they are not as bad as before she started the medication.  Will discuss with JA to see if she can increase her diltiazem.

## 2018-10-04 NOTE — Telephone Encounter (Signed)
Pt sent a mychart message that she is having really bad palpitations and her medicine isn't working. She ahs a 6 week fu with Renee 11-05-18. Pls advise [phone]

## 2018-10-14 NOTE — Telephone Encounter (Signed)
No arrhythmias documented on monitor. Given benign etiology, I would not advise increasing medicine.  regular exercise, ETOH caffeine avoidance, stress management and adequate hydration are encouraged.

## 2018-10-16 NOTE — Progress Notes (Signed)
Cardiology Office Note Date:  10/17/2018  Patient ID:  Sharon, Waller 12/04/1995, MRN [id number] PCP:  Antony Blackbird, MD  Cardiologist:  Dr. Radford Pax Electrophysiologist: Dr. Rayann Heman    Chief Complaint: follow up on diltiazem response  History of Present Illness: Sharon Waller is a 23 y.o. female with history of asthma referred to Dr. Radford Pax for c/o palpitations and atypical CP.  Her felt most c/w musculoskeletal with reproducibility and resolution after steroid and NSAIDs.  Much less likely pericarditis with normal ESR and history inconsistent with this, echo was ordered.  Her palpitations sounded random, occurred at rest when supine, as well as with simple exertion like standing up.  She had a couple ER visist that suggested they were secondary to anxiety, perhaps her asthma with a component of SOB.  The patient did not feel either of these were the issue. And a ZIo monitor was ordered.  Echo noted LVEF 55%, mod thickening of MV leaflets only trivial MR.  Longterm monitor noted few breif tachycardia episodes.and referred to EP. She saw Dr. Rayann Heman via a telehealth visit 09/12/2018, felt these may be very short and infrequent Atach episodes.  Her event monitor results felt to be benign with primarily sinus tachycardia, with rare, infrequent AT that is not amenable to ablation and not likely clinically significant. Discussed lifestyle modification at length, regular exercise, hydration, avoidance of caffeine and ETOH, stress management.  Started on diltiazem 185mdaily with plans to f/u in 6 weeks to re-assess.  She feels like she had some minimal improvement in her palpitations from feeling them all day every day with any exertion to having some good days, though still feels like she is sick.  Numerically she reports going from a 10/10 terrible to a 6/10. No CP, no neat syncope or syncope.  With the racing she gets a fullness in her head, no near syncope or syncope.  She is terribly  frustrated, says until April her life was fine and felt great to being nearly unable to do anything without her heart racing.   She denies any ETOH drugs, stays away from sodas, no coffee.  She has a 2 year and is busy active carinig for her.  Feels like she drinks enough/good amount of water.  Past Medical History:  Diagnosis Date  . Abdominal pain affecting pregnancy, antepartum 09/26/2015   [id number] 09/23/15   . Allergic rhinitis 07/11/2018  . Asthma   . Bacterial vaginosis   . Bronchitis   . Choroid plexus cyst of fetus 01/07/2016  . Mild intermittent asthma 07/11/2018  . SVD (spontaneous vaginal delivery) 05/04/2016  . UTI (lower urinary tract infection)     Past Surgical History:  Procedure Laterality Date  . NO PAST SURGERIES      Current Outpatient Medications  Medication Sig Dispense Refill  . cetirizine (ZYRTEC) 10 MG tablet Take 1 tablet (10 mg total) by mouth daily. 30 tablet 5  . diltiazem (CARDIZEM CD) 120 MG 24 hr capsule Take 1 capsule (120 mg total) by mouth daily. 30 capsule 2  . fluticasone (FLOVENT HFA) 110 MCG/ACT inhaler Inhale 2 puffs into the lungs 2 (two) times daily. 1 Inhaler 5  . levalbuterol (XOPENEX HFA) 45 MCG/ACT inhaler Inhale 2 puffs into the lungs every 4 (four) hours as needed. 1 Inhaler 1  . loratadine (CLARITIN) 10 MG tablet Take 1 tablet (10 mg total) by mouth daily. To help with headache/congestion 30 tablet 11  . triamcinolone cream (KENALOG) 0.1 %  Apply 1 application topically 2 (two) times daily. For 10 days then as needed 30 g 11  . Vitamin D, Ergocalciferol, (DRISDOL) 1.25 MG (50000 UT) CAPS capsule Take one pill once per week for a total of 12 weeks 4 capsule 2   No current facility-administered medications for this visit.     Allergies:   Augmentin [amoxicillin-pot clavulanate]   Social History:  The patient  reports that she quit smoking about 3 years ago. Her smoking use included cigarettes. She has never used smokeless tobacco. She  reports that she does not drink alcohol or use drugs.   Family History:  The patient's family history includes Allergic rhinitis in her father, paternal grandfather, and paternal grandmother; Asthma in her brother; Breast cancer in her maternal grandmother; Cancer in her maternal aunt and maternal grandmother.   ROS:  Please see the history of present illness.    All other systems are reviewed and otherwise negative.   PHYSICAL EXAM:  VS:  BP 118/79   Pulse 62   Ht '5\' 6"'  (1.676 m)   Wt 133 lb (60.3 kg)   LMP 12/16/2017 (Exact Date)   BMI 21.47 kg/m  BMI: Body mass index is 21.47 kg/m. Well nourished, well developed, healthy appearing female in no acute distress  HEENT: normocephalic, atraumatic  Neck: no JVD, carotid bruits or masses Cardiac:  RRR; no significant murmurs, no rubs, or gallops Lungs:  CTA b/l, no wheezing, rhonchi or rales  Abd: soft, nontender MS: no deformity or atrophy Ext: no edema  Skin: warm and dry, no rash Neuro:  No gross deficits appreciated Psych: euthymic mood, full affect    EKG: not done today   08/21/2018: Long term monitor  Bradycardia to sinus tachycardia.  29 Supraventricular Tachycardias the longest lasting 12 seconds which rate 182 bpm.   Bradycardia to sinus tachycardia. 29 Supraventricular Tachycardias the longest lasting 12 seconds which rate 182 bpm.  This possibly represent atrial tachycardia, consider referral to EP.   07/19/2018: TTE IMPRESSIONS  1. The left ventricle has a visually estimated ejection fraction of 55%. The cavity size was normal. Left ventricular diastolic parameters were normal.  2. The right ventricle has normal systolic function. The cavity was normal. There is no increase in right ventricular wall thickness.  3. The mitral valve is degenerative. Moderate thickening of the mitral valve leaflet. Mild calcification of the mitral valve leaflet.  4. The aortic valve is tricuspid. Mild thickening of the aortic valve.  Mild calcification of the aortic valve.  FINDINGS  Left Ventricle: The left ventricle has a visually estimated ejection fraction of 55. The cavity size was normal. There is no increase in left ventricular wall thickness. Left ventricular diastolic parameters were normal.  Right Ventricle: The right ventricle has normal systolic function. The cavity was normal. There is no increase in right ventricular wall thickness.  Left Atrium: Left atrial size was normal in size.  Right Atrium: Right atrial size was normal in size. Right atrial pressure is estimated at 10 mmHg.  Interatrial Septum: No atrial level shunt detected by color flow Doppler.  Pericardium: There is no evidence of pericardial effusion.  Mitral Valve: The mitral valve is degenerative in appearance. Moderate thickening of the mitral valve leaflet. Mild calcification of the mitral valve leaflet. Mitral valve regurgitation is trivial by color flow Doppler.  Tricuspid Valve: The tricuspid valve is normal in structure. Tricuspid valve regurgitation is mild by color flow Doppler.  Aortic Valve: The aortic valve is tricuspid  Mild thickening of the aortic valve. Mild calcification of the aortic valve. Aortic valve regurgitation was not visualized by color flow Doppler. There is no evidence of aortic valve stenosis.  Pulmonic Valve: The pulmonic valve was grossly normal. Pulmonic valve regurgitation is mild by color flow Doppler.  Venous: The inferior vena cava is normal in size with greater than 50% respiratory variability.  Recent Labs: 06/27/2018: ALT 13; BUN 8; Creatinine, Ser 0.59; Hemoglobin 12.8; Platelets 268; Potassium 3.5; Sodium 138 07/11/2018: TSH 0.570  No results found for requested labs within last 8760 hours.   CrCl cannot be calculated (Patient's most recent lab result is older than the maximum 21 days allowed.).   Wt Readings from Last 3 Encounters:  10/17/18 133 lb (60.3 kg)  09/12/18 131 lb (59.4 kg)   08/08/18 131 lb 12.8 oz (59.8 kg)     Other studies reviewed: Additional studies/records reviewed today include: summarized above  ASSESSMENT AND PLAN:  1. Palpitations, SOB     Sinus tach, A tach     She has had some improvement with diltiazem  There seems a disconnect in the frequency and duration of her symptoms and the monitor findings of only seconds of tachycardia at a time I initially discussed perhaps increasing dose of medication though  I think may lead to hypotension and/or bradycardia with resting BP today of 100/67 and 58bpm initially  I encouraged her to continue to follow lifestyle recommendations as Dr. Rayann Heman discussed with her as well. She is certain that her symptoms are not anxiety related She denies pregnancy states she finished her menses yesterday.  She is counseled not to get pregnant while on medicines and if she becomes pregnant to let us know   She is a bit disappointed with no changes being made today, so I discussed with DOD.  Agrees with plan   I will reach out to Dr. Rayann Heman for his thoughts as well, plan to see her back in a month, perhaps she will continue to see more improvement.     Disposition: as above  Current medicines are reviewed at length with the patient today.  The patient did not have any concerns regarding medicines.  Venetia Night, PA-C 10/17/2018 2:23 PM     [place]  [id number] [phone] (office)  [phone] (fax)

## 2018-10-17 ENCOUNTER — Other Ambulatory Visit: Payer: Self-pay

## 2018-10-17 ENCOUNTER — Encounter: Payer: Self-pay | Admitting: Physician Assistant

## 2018-10-17 ENCOUNTER — Ambulatory Visit: Payer: Medicaid Other | Admitting: Physician Assistant

## 2018-10-17 VITALS — BP 118/79 | HR 62 | Ht 66.0 in | Wt 133.0 lb

## 2018-10-17 DIAGNOSIS — R000 Tachycardia, unspecified: Secondary | ICD-10-CM | POA: Diagnosis not present

## 2018-10-17 DIAGNOSIS — R002 Palpitations: Secondary | ICD-10-CM | POA: Diagnosis not present

## 2018-10-17 DIAGNOSIS — I471 Supraventricular tachycardia: Secondary | ICD-10-CM

## 2018-10-17 NOTE — Patient Instructions (Addendum)
Medication Instructions:   Your physician recommends that you continue on your current medications as directed. Please refer to the Current Medication list given to you today.   If you need a refill on your cardiac medications before your next appointment, please call your pharmacy.   Lab work: NONE ORDERED  TODAY   If you have labs (blood work) drawn today and your tests are completely normal, you will receive your results only by: Marland Kitchen MyChart Message (if you have MyChart) OR . A paper copy in the mail If you have any lab test that is abnormal or we need to change your treatment, we will call you to review the results.  Testing/Procedures: NONE ORDERED  TODAY  Follow-Up: At [facility], you and your health needs are our priority.  As part of our continuing mission to provide you with exceptional heart care, we have created designated Provider Care Teams.  These Care Teams include your primary Cardiologist (physician) and Advanced Practice Providers (APPs -  Physician Assistants and Nurse Practitioners) who all work together to provide you with the care you need, when you need it. You will need a follow up appointment in 1 months. You may see Dr. Rayann Heman  or one of the following Advanced Practice Providers on your designated Care Team:   Chanetta Marshall, NP . Tommye Standard, PA-C  Any Other Special Instructions Will Be Listed Below (If Applicable).

## 2018-11-05 ENCOUNTER — Ambulatory Visit: Payer: Medicaid Other | Admitting: Physician Assistant

## 2018-11-13 NOTE — Progress Notes (Signed)
Cardiology Office Note Date:  11/13/2018  Patient ID:  Sharon Waller, DOB 18-Nov-1995, MRN [id number] PCP:  Antony Blackbird, MD  Cardiologist:  Dr. Radford Pax Electrophysiologist: Dr. Rayann Heman    Chief Complaint: follow up on diltiazem response  History of Present Illness: Sharon Waller is a 23 y.o. female with history of asthma referred to Dr. Radford Pax for c/o palpitations and atypical CP.  Her felt most c/w musculoskeletal with reproducibility and resolution after steroid and NSAIDs.  Much less likely pericarditis with normal ESR and history inconsistent with this, echo was ordered.  Her palpitations sounded random, occurred at rest when supine, as well as with simple exertion like standing up.  She had a couple ER visist that suggested they were secondary to anxiety, perhaps her asthma with a component of SOB.  The patient did not feel either of these were the issue. And a ZIo monitor was ordered.  Echo noted LVEF 55%, mod thickening of MV leaflets only trivial MR.  Longterm monitor noted few breif tachycardia episodes.and referred to EP. She saw Dr. Rayann Heman via a telehealth visit 09/12/2018, felt these may be very short and infrequent Atach episodes.  Her event monitor results felt to be benign with primarily sinus tachycardia, with rare, infrequent AT that is not amenable to ablation and not likely clinically significant. Discussed lifestyle modification at length, regular exercise, hydration, avoidance of caffeine and ETOH, stress management.  Started on diltiazem 144mdaily with plans to f/u in 6 weeks to re-assess.  I saw her in f/u last month, she reported she had some minimal improvement in her palpitations from feeling them all day every day with any exertion to having some good days, though still feels like she is sick.  Numerically she reports going from a 10/10 terrible to a 6/10. No CP, no neat syncope or syncope.  With the racing she gets a fullness in her head, no near syncope or  syncope.  The frequency and severity of her symptoms seemed much different then what we saw on her longterm monitoring, I did not feel like she had BP room for uptitration and was not convinced tachycardia was of significant burden.  She was terribly frustrated, says until April her life was fine and felt great to being nearly unable to do anything without her heart racing.   She denied any ETOH drugs, stays away from sodas, no coffee.  She has a 2 year and is busy active carinig for her.  Feels like she drinks enough/good amount of water. Orthostatic vitas were negative  I made no changes at that visit, though afterwards did reach out to Dr. ARayann Hemanfor his thoughts.  He recommended confirming no marijuana/drug use and outside if this reassurance was about all we had to offer.  She has had some slight improvement in her symptoms again, though still continues to feel that she is limited in her ability to be active 2/2 heart racing.  Though not all day every day anymore like it had been.  No near syncope or syncope. She assures no ETOH or drug use of any kind.  No smoking.    Past Medical History:  Diagnosis Date  . Abdominal pain affecting pregnancy, antepartum 09/26/2015   [id number] 09/23/15   . Allergic rhinitis 07/11/2018  . Asthma   . Bacterial vaginosis   . Bronchitis   . Choroid plexus cyst of fetus 01/07/2016  . Mild intermittent asthma 07/11/2018  . SVD (spontaneous vaginal delivery) 05/04/2016  .  UTI (lower urinary tract infection)     Past Surgical History:  Procedure Laterality Date  . NO PAST SURGERIES      Current Outpatient Medications  Medication Sig Dispense Refill  . cetirizine (ZYRTEC) 10 MG tablet Take 1 tablet (10 mg total) by mouth daily. 30 tablet 5  . diltiazem (CARDIZEM CD) 120 MG 24 hr capsule Take 1 capsule (120 mg total) by mouth daily. 30 capsule 2  . fluticasone (FLOVENT HFA) 110 MCG/ACT inhaler Inhale 2 puffs into the lungs 2 (two) times daily. 1 Inhaler 5   . levalbuterol (XOPENEX HFA) 45 MCG/ACT inhaler Inhale 2 puffs into the lungs every 4 (four) hours as needed. 1 Inhaler 1  . loratadine (CLARITIN) 10 MG tablet Take 1 tablet (10 mg total) by mouth daily. To help with headache/congestion 30 tablet 11  . triamcinolone cream (KENALOG) 0.1 % Apply 1 application topically 2 (two) times daily. For 10 days then as needed 30 g 11  . Vitamin D, Ergocalciferol, (DRISDOL) 1.25 MG (50000 UT) CAPS capsule Take one pill once per week for a total of 12 weeks 4 capsule 2   No current facility-administered medications for this visit.     Allergies:   Augmentin [amoxicillin-pot clavulanate]   Social History:  The patient  reports that she quit smoking about 3 years ago. Her smoking use included cigarettes. She has never used smokeless tobacco. She reports that she does not drink alcohol or use drugs.   Family History:  The patient's family history includes Allergic rhinitis in her father, paternal grandfather, and paternal grandmother; Asthma in her brother; Breast cancer in her maternal grandmother; Cancer in her maternal aunt and maternal grandmother.   ROS:  Please see the history of present illness.    All other systems are reviewed and otherwise negative.   PHYSICAL EXAM:  VS:  LMP 12/16/2017 (Exact Date)  BMI: There is no height or weight on file to calculate BMI. Well nourished, well developed, healthy appearing female in no acute distress  HEENT: normocephalic, atraumatic  Neck: no JVD, carotid bruits or masses Cardiac:  RRR; no significant murmurs, no rubs, or gallops Lungs:   CTA b/l, no wheezing, rhonchi or rales  Abd: soft, nontender MS: no deformity or atrophy Ext: no edema  Skin: warm and dry, no rash Neuro:  No gross deficits appreciated Psych: euthymic mood, full affect    EKG: not done today   08/21/2018: Long term monitor  Bradycardia to sinus tachycardia.  29 Supraventricular Tachycardias the longest lasting 12 seconds which  rate 182 bpm.   Bradycardia to sinus tachycardia. 29 Supraventricular Tachycardias the longest lasting 12 seconds which rate 182 bpm.  This possibly represent atrial tachycardia, consider referral to EP.   07/19/2018: TTE IMPRESSIONS  1. The left ventricle has a visually estimated ejection fraction of 55%. The cavity size was normal. Left ventricular diastolic parameters were normal.  2. The right ventricle has normal systolic function. The cavity was normal. There is no increase in right ventricular wall thickness.  3. The mitral valve is degenerative. Moderate thickening of the mitral valve leaflet. Mild calcification of the mitral valve leaflet.  4. The aortic valve is tricuspid. Mild thickening of the aortic valve. Mild calcification of the aortic valve.  FINDINGS  Left Ventricle: The left ventricle has a visually estimated ejection fraction of 55. The cavity size was normal. There is no increase in left ventricular wall thickness. Left ventricular diastolic parameters were normal.  Right Ventricle: The  right ventricle has normal systolic function. The cavity was normal. There is no increase in right ventricular wall thickness.  Left Atrium: Left atrial size was normal in size.  Right Atrium: Right atrial size was normal in size. Right atrial pressure is estimated at 10 mmHg.  Interatrial Septum: No atrial level shunt detected by color flow Doppler.  Pericardium: There is no evidence of pericardial effusion.  Mitral Valve: The mitral valve is degenerative in appearance. Moderate thickening of the mitral valve leaflet. Mild calcification of the mitral valve leaflet. Mitral valve regurgitation is trivial by color flow Doppler.  Tricuspid Valve: The tricuspid valve is normal in structure. Tricuspid valve regurgitation is mild by color flow Doppler.  Aortic Valve: The aortic valve is tricuspid Mild thickening of the aortic valve. Mild calcification of the aortic valve. Aortic  valve regurgitation was not visualized by color flow Doppler. There is no evidence of aortic valve stenosis.  Pulmonic Valve: The pulmonic valve was grossly normal. Pulmonic valve regurgitation is mild by color flow Doppler.  Venous: The inferior vena cava is normal in size with greater than 50% respiratory variability.  Recent Labs: 06/27/2018: ALT 13; BUN 8; Creatinine, Ser 0.59; Hemoglobin 12.8; Platelets 268; Potassium 3.5; Sodium 138 07/11/2018: TSH 0.570  No results found for requested labs within last 8760 hours.   CrCl cannot be calculated (Patient's most recent lab result is older than the maximum 21 days allowed.).   Wt Readings from Last 3 Encounters:  10/17/18 133 lb (60.3 kg)  09/12/18 131 lb (59.4 kg)  08/08/18 131 lb 12.8 oz (59.8 kg)     Other studies reviewed: Additional studies/records reviewed today include: summarized above  ASSESSMENT AND PLAN:  1. Palpitations, SOB     Sinus tach, A tach     She has had some more mild improvement with diltiazem though remains quite frustrated with her symptoms.     Orthostatics vitals today are normal   We revisited life style strategies, rational for not increasing her medicine.    Given her frustration over lack of significant improvement and not feeling like she cannot live her life any more, we discussed re-monitoring her to better identify her symptom and correlation (or not) to HR/rhythm during times of symptoms, she would like to do that  Will plan for Zio XT  See her back in 2 mo, sooner if needed.      Disposition: as above  Current medicines are reviewed at length with the patient today.  The patient did not have any concerns regarding medicines.  Venetia Night, PA-C 11/13/2018 9:19 AM     [place] [address] [place] [address] [phone] (office)  [phone] (fax)

## 2018-11-14 ENCOUNTER — Telehealth: Payer: Self-pay | Admitting: Radiology

## 2018-11-14 ENCOUNTER — Other Ambulatory Visit: Payer: Self-pay

## 2018-11-14 ENCOUNTER — Ambulatory Visit (INDEPENDENT_AMBULATORY_CARE_PROVIDER_SITE_OTHER): Payer: Medicaid Other | Admitting: Physician Assistant

## 2018-11-14 ENCOUNTER — Encounter: Payer: Self-pay | Admitting: Physician Assistant

## 2018-11-14 VITALS — BP 102/62 | HR 56 | Ht 66.0 in | Wt 131.0 lb

## 2018-11-14 DIAGNOSIS — R002 Palpitations: Secondary | ICD-10-CM | POA: Diagnosis not present

## 2018-11-14 DIAGNOSIS — I471 Supraventricular tachycardia: Secondary | ICD-10-CM

## 2018-11-14 NOTE — Patient Instructions (Signed)
Medication Instructions:  Your physician recommends that you continue on your current medications as directed. Please refer to the Current Medication list given to you today.  If you need a refill on your cardiac medications before your next appointment, please call your pharmacy.   Lab work: NONE ORDERED  TODAY   If you have labs (blood work) drawn today and your tests are completely normal, you will receive your results only by: Marland Kitchen MyChart Message (if you have MyChart) OR . A paper copy in the mail If you have any lab test that is abnormal or we need to change your treatment, we will call you to review the results.  Testing/Procedures: Your physician has recommended that you wear an event monitor. Event monitors are medical devices that record the heart's electrical activity. Doctors most often [place] these monitors to diagnose arrhythmias. Arrhythmias are problems with the speed or rhythm of the heartbeat. The monitor is a small, portable device. You can wear one while you do your normal daily activities. This is usually used to diagnose what is causing palpitations/syncope (passing out).   Follow-Up: At [facility], you and your health needs are our priority.  As part of our continuing mission to provide you with exceptional heart care, we have created designated Provider Care Teams.  These Care Teams include your primary Cardiologist (physician) and Advanced Practice Providers (APPs -  Physician Assistants and Nurse Practitioners) who all work together to provide you with the care you need, when you need it. You will need a follow up appointment in 2 months.  Please call our office 2 months in advance to schedule this appointment.  You may see None  Dr. Rayann Heman  or one of the following Advanced Practice Providers on your designated Care Team:  Tommye Standard, PA-C  Any Other Special Instructions Will Be Listed Below (If Applicable).

## 2018-11-14 NOTE — Telephone Encounter (Signed)
Enrolled patient for a 14 Day Zio monitor to be mailed. Patient knows to expect the monitor to arrive in 3-4 days.

## 2018-11-20 ENCOUNTER — Ambulatory Visit (INDEPENDENT_AMBULATORY_CARE_PROVIDER_SITE_OTHER): Payer: Medicaid Other

## 2018-11-20 DIAGNOSIS — R002 Palpitations: Secondary | ICD-10-CM

## 2018-12-07 ENCOUNTER — Other Ambulatory Visit: Payer: Self-pay | Admitting: Internal Medicine

## 2019-01-14 ENCOUNTER — Telehealth: Payer: Self-pay | Admitting: *Deleted

## 2019-01-14 NOTE — Telephone Encounter (Signed)
Spoke with patient and aware due to not mailing back in monitor in time enough will have to reschedule her appointment  Little further out   Pt understood and rescheduled 11-19 with Charlcie Cradle to give time for monitor results to get in.

## 2019-01-14 NOTE — Progress Notes (Deleted)
Cardiology Office Note Date:  01/14/2019  Patient ID:  Sharon Waller, Sharon Waller September 05, 1995, MRN [id number] PCP:  Antony Blackbird, MD  Cardiologist:  Dr. Radford Pax Electrophysiologist: Dr. Rayann Heman    Chief Complaint: follow up on diltiazem response  History of Present Illness: Sharon Waller is a 23 y.o. female with history of asthma referred to Dr. Radford Pax for c/o palpitations and atypical CP.  Her felt most c/w musculoskeletal with reproducibility and resolution after steroid and NSAIDs.  Much less likely pericarditis with normal ESR and history inconsistent with this, echo was ordered.  Her palpitations sounded random, occurred at rest when supine, as well as with simple exertion like standing up.  She had a couple ER visist that suggested they were secondary to anxiety, perhaps her asthma with a component of SOB.  The patient did not feel either of these were the issue. And a ZIo monitor was ordered.  Echo noted LVEF 55%, mod thickening of MV leaflets only trivial MR.  Longterm monitor noted few breif tachycardia episodes.and referred to EP. She saw Dr. Rayann Heman via a telehealth visit 09/12/2018, felt these may be very short and infrequent Atach episodes.  Her event monitor results felt to be benign with primarily sinus tachycardia, with rare, infrequent AT that is not amenable to ablation and not likely clinically significant. Discussed lifestyle modification at length, regular exercise, hydration, avoidance of caffeine and ETOH, stress management.  Started on diltiazem 191mdaily with plans to f/u in 6 weeks to re-assess.  I saw her in f/u last month, she reported she had some minimal improvement in her palpitations from feeling them all day every day with any exertion to having some good days, though still feels like she is sick.  Numerically she reports going from a 10/10 terrible to a 6/10. No CP, no neat syncope or syncope.  With the racing she gets a fullness in her head, no near syncope or  syncope.  The frequency and severity of her symptoms seemed much different then what we saw on her longterm monitoring, I did not feel like she had BP room for uptitration and was not convinced tachycardia was of significant burden.  She was terribly frustrated, says until April her life was fine and felt great to being nearly unable to do anything without her heart racing.   She denied any ETOH drugs, stays away from sodas, no coffee.  She has a 2 year and is busy active carinig for her.  Feels like she drinks enough/good amount of water. Orthostatic vitas were negative  I made no changes at that visit, though afterwards did reach out to Dr. ARayann Hemanfor his thoughts.  He recommended confirming no marijuana/drug use and outside if this reassurance was about all we had to offer.  I saw her 0/04/2018, she reported some slight improvement in her symptoms again, though still continues to feel that she is limited in her ability to be active 2/2 heart racing.  Though not all day every day anymore like it had been.  No near syncope or syncope. She assures no ETOH or drug use of any kind.  No smoking. She felt like she could not live her life normally and was very frustrated.  GIven this, and ongoing frequrent symptoms, decided to repeat monitoring to re-visit symptoms and monitor findings.  *** no monitor yet *** symptoms *** meds, labs    Past Medical History:  Diagnosis Date  . Abdominal pain affecting pregnancy, antepartum 09/26/2015   [id number]  on 09/23/15   . Allergic rhinitis 07/11/2018  . Asthma   . Bacterial vaginosis   . Bronchitis   . Choroid plexus cyst of fetus 01/07/2016  . Mild intermittent asthma 07/11/2018  . SVD (spontaneous vaginal delivery) 05/04/2016  . UTI (lower urinary tract infection)     Past Surgical History:  Procedure Laterality Date  . NO PAST SURGERIES      Current Outpatient Medications  Medication Sig Dispense Refill  . cetirizine (ZYRTEC) 10 MG tablet Take 1  tablet (10 mg total) by mouth daily. 30 tablet 5  . diltiazem (CARDIZEM CD) 120 MG 24 hr capsule TAKE 1 CAPSULE BY MOUTH EVERY DAY 30 capsule 11  . fluticasone (FLOVENT HFA) 110 MCG/ACT inhaler Inhale 2 puffs into the lungs 2 (two) times daily. 1 Inhaler 5  . levalbuterol (XOPENEX HFA) 45 MCG/ACT inhaler Inhale 2 puffs into the lungs every 4 (four) hours as needed. 1 Inhaler 1  . loratadine (CLARITIN) 10 MG tablet Take 1 tablet (10 mg total) by mouth daily. To help with headache/congestion 30 tablet 11  . triamcinolone cream (KENALOG) 0.1 % Apply 1 application topically 2 (two) times daily. For 10 days then as needed 30 g 11  . Vitamin D, Ergocalciferol, (DRISDOL) 1.25 MG (50000 UT) CAPS capsule Take one pill once per week for a total of 12 weeks 4 capsule 2   No current facility-administered medications for this visit.     Allergies:   Augmentin [amoxicillin-pot clavulanate]   Social History:  The patient  reports that she quit smoking about 3 years ago. Her smoking use included cigarettes. She has never used smokeless tobacco. She reports that she does not drink alcohol or use drugs.   Family History:  The patient's family history includes Allergic rhinitis in her father, paternal grandfather, and paternal grandmother; Asthma in her brother; Breast cancer in her maternal grandmother; Cancer in her maternal aunt and maternal grandmother.   ROS:  Please see the history of present illness.    All other systems are reviewed and otherwise negative.   PHYSICAL EXAM:  VS:  There were no vitals taken for this visit. BMI: There is no height or weight on file to calculate BMI. Well nourished, well developed, healthy appearing female in no acute distress  HEENT: normocephalic, atraumatic  Neck: no JVD, carotid bruits or masses Cardiac:  *** RRR; no significant murmurs, no rubs, or gallops Lungs:   *** CTA b/l, no wheezing, rhonchi or rales  Abd: soft, nontender MS: no deformity or atrophy Ext:  *** no edema  Skin: warm and dry, no rash Neuro:  No gross deficits appreciated Psych: euthymic mood, full affect    EKG: not done today   08/21/2018: Long term monitor  Bradycardia to sinus tachycardia.  29 Supraventricular Tachycardias the longest lasting 12 seconds which rate 182 bpm.   Bradycardia to sinus tachycardia. 29 Supraventricular Tachycardias the longest lasting 12 seconds which rate 182 bpm.  This possibly represent atrial tachycardia, consider referral to EP.   07/19/2018: TTE IMPRESSIONS  1. The left ventricle has a visually estimated ejection fraction of 55%. The cavity size was normal. Left ventricular diastolic parameters were normal.  2. The right ventricle has normal systolic function. The cavity was normal. There is no increase in right ventricular wall thickness.  3. The mitral valve is degenerative. Moderate thickening of the mitral valve leaflet. Mild calcification of the mitral valve leaflet.  4. The aortic valve is tricuspid. Mild thickening of the  aortic valve. Mild calcification of the aortic valve.  FINDINGS  Left Ventricle: The left ventricle has a visually estimated ejection fraction of 55. The cavity size was normal. There is no increase in left ventricular wall thickness. Left ventricular diastolic parameters were normal.  Right Ventricle: The right ventricle has normal systolic function. The cavity was normal. There is no increase in right ventricular wall thickness.  Left Atrium: Left atrial size was normal in size.  Right Atrium: Right atrial size was normal in size. Right atrial pressure is estimated at 10 mmHg.  Interatrial Septum: No atrial level shunt detected by color flow Doppler.  Pericardium: There is no evidence of pericardial effusion.  Mitral Valve: The mitral valve is degenerative in appearance. Moderate thickening of the mitral valve leaflet. Mild calcification of the mitral valve leaflet. Mitral valve regurgitation is  trivial by color flow Doppler.  Tricuspid Valve: The tricuspid valve is normal in structure. Tricuspid valve regurgitation is mild by color flow Doppler.  Aortic Valve: The aortic valve is tricuspid Mild thickening of the aortic valve. Mild calcification of the aortic valve. Aortic valve regurgitation was not visualized by color flow Doppler. There is no evidence of aortic valve stenosis.  Pulmonic Valve: The pulmonic valve was grossly normal. Pulmonic valve regurgitation is mild by color flow Doppler.  Venous: The inferior vena cava is normal in size with greater than 50% respiratory variability.  Recent Labs: 06/27/2018: ALT 13; BUN 8; Creatinine, Ser 0.59; Hemoglobin 12.8; Platelets 268; Potassium 3.5; Sodium 138 07/11/2018: TSH 0.570  No results found for requested labs within last 8760 hours.   CrCl cannot be calculated (Patient's most recent lab result is older than the maximum 21 days allowed.).   Wt Readings from Last 3 Encounters:  11/14/18 131 lb (59.4 kg)  10/17/18 133 lb (60.3 kg)  09/12/18 131 lb (59.4 kg)     Other studies reviewed: Additional studies/records reviewed today include: summarized above  ASSESSMENT AND PLAN:  1. Palpitations, SOB     Sinus tach, A tach     *** She has had some more mild improvement with diltiazem though remains quite frustrated with her symptoms.         Disposition: ***   Current medicines are reviewed at length with the patient today.  The patient did not have any concerns regarding medicines.  Venetia Night, PA-C 01/14/2019 8:01 AM     [facility] [address] [place] [address] [phone] (office)  [phone] (fax)

## 2019-01-16 ENCOUNTER — Ambulatory Visit: Payer: Medicaid Other | Admitting: Physician Assistant

## 2019-01-29 NOTE — Progress Notes (Deleted)
Cardiology Office Note Date:  01/29/2019  Patient ID:  Breckin, Zafar [birth year], MRN [id number] PCP:  Antony Blackbird, MD  Cardiologist:  Dr. Radford Pax Electrophysiologist: Dr. Rayann Heman    Chief Complaint:  *** follow up on monitor findings  History of Present Illness: Satrina I Picado is a 23 y.o. female with history of asthma referred to Dr. Radford Pax for c/o palpitations and atypical CP.  Her felt most c/w musculoskeletal with reproducibility and resolution after steroid and NSAIDs.  Much less likely pericarditis with normal ESR and history inconsistent with this, echo was ordered.  Her palpitations sounded random, occurred at rest when supine, as well as with simple exertion like standing up.  She had a couple ER visist that suggested they were secondary to anxiety, perhaps her asthma with a component of SOB.  The patient did not feel either of these were the issue. And a ZIo monitor was ordered.  Echo noted LVEF 55%, mod thickening of MV leaflets only trivial MR.  Longterm monitor noted few breif tachycardia episodes.and referred to EP. She saw Dr. Rayann Heman via a telehealth visit 09/12/2018, felt these may be very short and infrequent Atach episodes.  Her event monitor results felt to be benign with primarily sinus tachycardia, with rare, infrequent AT that is not amenable to ablation and not likely clinically significant. Discussed lifestyle modification at length, regular exercise, hydration, avoidance of caffeine and ETOH, stress management.  Started on diltiazem 144mdaily with plans to f/u in 6 weeks to re-assess.  I saw her in f/u last month, she reported she had some minimal improvement in her palpitations from feeling them all day every day with any exertion to having some good days, though still feels like she is sick.  Numerically she reports going from a 10/10 terrible to a 6/10. No CP, no neat syncope or syncope.  With the racing she gets a fullness in her head, no near syncope or  syncope.  The frequency and severity of her symptoms seemed much different then what we saw on her longterm monitoring, I did not feel like she had BP room for uptitration and was not convinced tachycardia was of significant burden.  She was terribly frustrated, says until April her life was fine and felt great to being nearly unable to do anything without her heart racing.   She denied any ETOH drugs, stays away from sodas, no coffee.  She has a 2 year and is busy active carinig for her.  Feels like she drinks enough/good amount of water. Orthostatic vitas were negative  I made no changes at that visit, though afterwards did reach out to Dr. ARayann Hemanfor his thoughts.  He recommended confirming no marijuana/drug use and outside if this reassurance was about all we had to offer.  I saw her 0/04/2018, she reported some slight improvement in her symptoms again, though still continues to feel that she is limited in her ability to be active 2/2 heart racing.  Though not all day every day anymore like it had been.  No near syncope or syncope. She assures no ETOH or drug use of any kind.  No smoking. She felt like she could not live her life normally and was very frustrated.  GIven this, and ongoing frequrent symptoms, decided to repeat monitoring to re-visit symptoms and monitor findings.  *** no monitor yet *** symptoms *** meds, labs    Past Medical History:  Diagnosis Date  . Abdominal pain affecting pregnancy, antepartum 09/26/2015  [redacted]w[redacted]d on 09/23/15   . Allergic rhinitis 07/11/2018  . Asthma   . Bacterial vaginosis   . Bronchitis   . Choroid plexus cyst of fetus 01/07/2016  . Mild intermittent asthma 07/11/2018  . SVD (spontaneous vaginal delivery) 05/04/2016  . UTI (lower urinary tract infection)     Past Surgical History:  Procedure Laterality Date  . NO PAST SURGERIES      Current Outpatient Medications  Medication Sig Dispense Refill  . cetirizine (ZYRTEC) 10 MG tablet Take 1  tablet (10 mg total) by mouth daily. 30 tablet 5  . diltiazem (CARDIZEM CD) 120 MG 24 hr capsule TAKE 1 CAPSULE BY MOUTH EVERY DAY 30 capsule 11  . fluticasone (FLOVENT HFA) 110 MCG/ACT inhaler Inhale 2 puffs into the lungs 2 (two) times daily. 1 Inhaler 5  . levalbuterol (XOPENEX HFA) 45 MCG/ACT inhaler Inhale 2 puffs into the lungs every 4 (four) hours as needed. 1 Inhaler 1  . loratadine (CLARITIN) 10 MG tablet Take 1 tablet (10 mg total) by mouth daily. To help with headache/congestion 30 tablet 11  . triamcinolone cream (KENALOG) 0.1 % Apply 1 application topically 2 (two) times daily. For 10 days then as needed 30 g 11  . Vitamin D, Ergocalciferol, (DRISDOL) 1.25 MG (50000 UT) CAPS capsule Take one pill once per week for a total of 12 weeks 4 capsule 2   No current facility-administered medications for this visit.     Allergies:   Augmentin [amoxicillin-pot clavulanate]   Social History:  The patient  reports that she quit smoking about 3 years ago. Her smoking use included cigarettes. She has never used smokeless tobacco. She reports that she does not drink alcohol or use drugs.   Family History:  The patient's family history includes Allergic rhinitis in her father, paternal grandfather, and paternal grandmother; Asthma in her brother; Breast cancer in her maternal grandmother; Cancer in her maternal aunt and maternal grandmother.   ROS:  Please see the history of present illness.    All other systems are reviewed and otherwise negative.   PHYSICAL EXAM:  VS:  There were no vitals taken for this visit. BMI: There is no height or weight on file to calculate BMI. Well nourished, well developed, healthy appearing female in no acute distress  HEENT: normocephalic, atraumatic  Neck: no JVD, carotid bruits or masses Cardiac:  *** RRR; no significant murmurs, no rubs, or gallops Lungs:   *** CTA b/l, no wheezing, rhonchi or rales  Abd: soft, nontender MS: no deformity or atrophy Ext:  *** no edema  Skin: warm and dry, no rash Neuro:  No gross deficits appreciated Psych: euthymic mood, full affect    EKG: not done today   08/21/2018: Long term monitor  Bradycardia to sinus tachycardia.  29 Supraventricular Tachycardias the longest lasting 12 seconds which rate 182 bpm.   Bradycardia to sinus tachycardia. 29 Supraventricular Tachycardias the longest lasting 12 seconds which rate 182 bpm.  This possibly represent atrial tachycardia, consider referral to EP.   07/19/2018: TTE IMPRESSIONS  1. The left ventricle has a visually estimated ejection fraction of 55%. The cavity size was normal. Left ventricular diastolic parameters were normal.  2. The right ventricle has normal systolic function. The cavity was normal. There is no increase in right ventricular wall thickness.  3. The mitral valve is degenerative. Moderate thickening of the mitral valve leaflet. Mild calcification of the mitral valve leaflet.  4. The aortic valve is tricuspid. Mild thickening of   the aortic valve. Mild calcification of the aortic valve.  FINDINGS  Left Ventricle: The left ventricle has a visually estimated ejection fraction of 55. The cavity size was normal. There is no increase in left ventricular wall thickness. Left ventricular diastolic parameters were normal.  Right Ventricle: The right ventricle has normal systolic function. The cavity was normal. There is no increase in right ventricular wall thickness.  Left Atrium: Left atrial size was normal in size.  Right Atrium: Right atrial size was normal in size. Right atrial pressure is estimated at 10 mmHg.  Interatrial Septum: No atrial level shunt detected by color flow Doppler.  Pericardium: There is no evidence of pericardial effusion.  Mitral Valve: The mitral valve is degenerative in appearance. Moderate thickening of the mitral valve leaflet. Mild calcification of the mitral valve leaflet. Mitral valve regurgitation is  trivial by color flow Doppler.  Tricuspid Valve: The tricuspid valve is normal in structure. Tricuspid valve regurgitation is mild by color flow Doppler.  Aortic Valve: The aortic valve is tricuspid Mild thickening of the aortic valve. Mild calcification of the aortic valve. Aortic valve regurgitation was not visualized by color flow Doppler. There is no evidence of aortic valve stenosis.  Pulmonic Valve: The pulmonic valve was grossly normal. Pulmonic valve regurgitation is mild by color flow Doppler.  Venous: The inferior vena cava is normal in size with greater than 50% respiratory variability.  Recent Labs: 06/27/2018: ALT 13; BUN 8; Creatinine, Ser 0.59; Hemoglobin 12.8; Platelets 268; Potassium 3.5; Sodium 138 07/11/2018: TSH 0.570  No results found for requested labs within last 8760 hours.   CrCl cannot be calculated (Patient's most recent lab result is older than the maximum 21 days allowed.).   Wt Readings from Last 3 Encounters:  11/14/18 131 lb (59.4 kg)  10/17/18 133 lb (60.3 kg)  09/12/18 131 lb (59.4 kg)     Other studies reviewed: Additional studies/records reviewed today include: summarized above  ASSESSMENT AND PLAN:  1. Palpitations, SOB     Sinus tach, A tach     *** She has had some more mild improvement with diltiazem though remains quite frustrated with her symptoms.         Disposition: ***   Current medicines are reviewed at length with the patient today.  The patient did not have any concerns regarding medicines.    Signed, Renee Ursuy, PA-C 01/29/2019 6:21 AM     [facility] [address]  [place] [address] [phone] (office)  [phone] (fax)   

## 2019-01-31 ENCOUNTER — Ambulatory Visit: Payer: Medicaid Other | Admitting: Physician Assistant

## 2019-02-11 ENCOUNTER — Telehealth: Payer: Self-pay | Admitting: *Deleted

## 2019-02-11 NOTE — Telephone Encounter (Signed)
Spoke with patient aware of results and recommendations.Patients  was agreeable with feedback  and will call if any symptoms reoccur again.

## 2019-02-13 ENCOUNTER — Telehealth: Payer: Self-pay

## 2019-02-13 NOTE — Telephone Encounter (Signed)
-----   Message from Thompson Grayer, MD sent at 02/08/2019  4:41 PM EST ----- Results reviewed.  Sonia Baller, please inform pt of result. I will route to primary care also.

## 2019-02-13 NOTE — Telephone Encounter (Signed)
LMTCB

## 2019-03-16 ENCOUNTER — Ambulatory Visit
Admission: EM | Admit: 2019-03-16 | Discharge: 2019-03-16 | Disposition: A | Discharge status: Home / Self Care | Payer: Medicaid Other | Attending: Emergency Medicine | Admitting: Emergency Medicine

## 2019-03-16 ENCOUNTER — Other Ambulatory Visit: Payer: Self-pay

## 2019-03-16 ENCOUNTER — Encounter: Payer: Self-pay | Admitting: Emergency Medicine

## 2019-03-16 DIAGNOSIS — L089 Local infection of the skin and subcutaneous tissue, unspecified: Secondary | ICD-10-CM | POA: Diagnosis not present

## 2019-03-16 DIAGNOSIS — L729 Follicular cyst of the skin and subcutaneous tissue, unspecified: Secondary | ICD-10-CM | POA: Diagnosis not present

## 2019-03-16 MED ORDER — DOXYCYCLINE HYCLATE 100 MG PO CAPS: 100.0000 mg | ORAL_CAPSULE | Freq: Two times a day (BID) | ORAL | 0 refills | Status: AC

## 2019-03-16 NOTE — ED Triage Notes (Addendum)
Pt presents to [facility] for assessment of cyst to left inner thigh, had one in the same area 1 month ago.  Patient states it was so painful she could not sleep last night.  C/o draining beginning this morning.  Pt is wearing a dress, did not want to change into a gown.

## 2019-03-16 NOTE — ED Provider Notes (Signed)
[facility]    CSN: [id number] Arrival date & time: 03/16/19  1014      History   Chief Complaint Chief Complaint  Patient presents with  . Cyst    HPI Sharon Waller is a 24 y.o. female with history of asthma presenting for left upper thigh cyst.  Patient states that she has had this before, last flare was a month ago.  Endorsing pain since last night.  Did have swelling for nearly a week prior.  Has been doing hot soaks with adequate relief.  States it best on its own this morning.  Denies labial involvement, trauma to the area, known bug bite.  No fevers, arthralgias, myalgias.    Past Medical History:  Diagnosis Date  . Abdominal pain affecting pregnancy, antepartum 09/26/2015   [redacted]w[redacted]d on 09/23/15   . Allergic rhinitis 07/11/2018  . Asthma   . Bacterial vaginosis   . Bronchitis   . Choroid plexus cyst of fetus 01/07/2016  . Mild intermittent asthma 07/11/2018  . SVD (spontaneous vaginal delivery) 05/04/2016  . UTI (lower urinary tract infection)     Patient Active Problem List   Diagnosis Date Noted  . Palpitations 07/17/2018  . SOB (shortness of breath) 07/17/2018  . Chest pain of uncertain etiology 07/17/2018  . Mild intermittent asthma 07/11/2018  . Allergic rhinitis 07/11/2018    Past Surgical History:  Procedure Laterality Date  . NO PAST SURGERIES      OB History    Gravida  2   Para  1   Term  1   Preterm  0   AB  0   Living  1     SAB  0   TAB  0   Ectopic  0   Multiple  0   Live Births  1            Home Medications    Prior to Admission medications   Medication Sig Start Date End Date Taking? Authorizing Provider  diltiazem (CARDIZEM CD) 120 MG 24 hr capsule TAKE 1 CAPSULE BY MOUTH EVERY DAY 12/07/18  Yes Allred, Fayrene Fearing, MD  cetirizine (ZYRTEC) 10 MG tablet Take 1 tablet (10 mg total) by mouth daily. 08/08/18   Marcelyn Bruins, MD  doxycycline (VIBRAMYCIN) 100 MG capsule Take 1 capsule (100 mg total) by  mouth 2 (two) times daily for 5 days. 03/16/19 03/21/19  Hall-Potvin, [place], PA-C  fluticasone (FLOVENT HFA) 110 MCG/ACT inhaler Inhale 2 puffs into the lungs 2 (two) times daily. 08/08/18   Marcelyn Bruins, MD  levalbuterol [facility] HFA) 45 MCG/ACT inhaler Inhale 2 puffs into the lungs every 4 (four) hours as needed. 08/08/18   Marcelyn Bruins, MD  loratadine (CLARITIN) 10 MG tablet Take 1 tablet (10 mg total) by mouth daily. To help with headache/congestion 07/25/18   Fulp, Cammie, MD  triamcinolone cream (KENALOG) 0.1 % Apply 1 application topically 2 (two) times daily. For 10 days then as needed 08/01/18   Fulp, Cammie, MD  Vitamin D, Ergocalciferol, (DRISDOL) 1.25 MG (50000 UT) CAPS capsule Take one pill once per week for a total of 12 weeks 07/12/18   Cain Saupe, MD    Family History Family History  Problem Relation Age of Onset  . Cancer Maternal Aunt   . Cancer Maternal Grandmother   . Breast cancer Maternal Grandmother   . Allergic rhinitis Father   . Asthma Brother        as child  . Allergic  rhinitis Paternal Grandmother   . Allergic rhinitis Paternal Grandfather     Social History Social History   Tobacco Use  . Smoking status: Former Smoker    Types: Cigarettes    Quit date: 09/30/2015    Years since quitting: 3.4  . Smokeless tobacco: Never Used  Substance Use Topics  . Alcohol use: No  . Drug use: No     Allergies   Augmentin [amoxicillin-pot clavulanate]   Review of Systems Review of Systems  Constitutional: Negative for fatigue and fever.  HENT: Negative for ear pain, sinus pain, sore throat and voice change.   Eyes: Negative for pain, redness and visual disturbance.  Respiratory: Negative for cough and shortness of breath.   Cardiovascular: Negative for chest pain and palpitations.  Gastrointestinal: Negative for abdominal pain, diarrhea and vomiting.  Musculoskeletal: Negative for arthralgias and myalgias.  Skin: Positive for wound.  Negative for rash.  Neurological: Negative for syncope and headaches.     Physical Exam Triage Vital Signs ED Triage Vitals [03/16/19 1148]  Enc Vitals Group     BP 109/76     Pulse Rate (!) 51     Resp 16     Temp (!) 97.5 F (36.4 C)     Temp Source Temporal     SpO2 95 %     Weight      Height      Head Circumference      Peak Flow      Pain Score 8     Pain Loc      Pain Edu?      Excl. in [place]?    No data found.  Updated Vital Signs BP 109/76 (BP Location: Left Arm)   Pulse (!) 51   Temp (!) 97.5 F (36.4 C) (Temporal)   Resp 16   LMP 02/12/2019   SpO2 95%   Visual Acuity Right Eye Distance:   Left Eye Distance:   Bilateral Distance:    Right Eye Near:   Left Eye Near:    Bilateral Near:     Physical Exam Constitutional:      General: She is not in acute distress. HENT:     Head: Normocephalic and atraumatic.  Eyes:     General: No scleral icterus.    Pupils: Pupils are equal, round, and reactive to light.  Cardiovascular:     Rate and Rhythm: Regular rhythm. Bradycardia present.     Comments: 57 bpm at bedside Pulmonary:     Effort: Pulmonary effort is normal.  Skin:    Capillary Refill: Capillary refill takes less than 2 seconds.     Coloration: Skin is not jaundiced or pale.     Comments: 0.5 cm ruptured cyst with surrounding erythema.  No streaking, active discharge.  Mildly TTP.    Neurological:     General: No focal deficit present.     Mental Status: She is alert and oriented to person, place, and time.      UC Treatments / Results  Labs (all labs ordered are listed, but only abnormal results are displayed) Labs Reviewed - No data to display  EKG   Radiology No results found.  Procedures Procedures (including critical care time)  Medications Ordered in UC Medications - No data to display  Initial Impression / Assessment and Plan / UC Course  I have reviewed the triage vital signs and the nursing notes.  Pertinent labs  & imaging results that were available during my care  of the patient were reviewed by me and considered in my medical decision making (see chart for details).     Patient afebrile, nontoxic.  Cyst ruptured on its own.  Will continue hot soaks/compresses, and doxycycline short-term.  Return precautions discussed, patient verbalized understanding and is agreeable to plan. Final Clinical Impressions(s) / UC Diagnoses   Final diagnoses:  Infected cyst of skin     Discharge Instructions     Keep area(s) clean and dry. Apply hot compress / towel for 5-10 minutes 3-5 times daily. Take antibiotic as prescribed with food - important to complete course. Return for worsening pain, redness, swelling, discharge, fever.  Helpful prevention tips: Keep nails short to avoid secondary skin infections. Use new, clean razors when shaving.    ED Prescriptions    Medication Sig Dispense Auth. Provider   doxycycline (VIBRAMYCIN) 100 MG capsule Take 1 capsule (100 mg total) by mouth 2 (two) times daily for 5 days. 10 capsule Hall-Potvin, [place], PA-C     PDMP not reviewed this encounter.   Hall-Potvin, [place], [place] 03/16/19 1248

## 2019-03-16 NOTE — Discharge Instructions (Addendum)
Keep area(s) clean and dry. °Apply hot compress / towel for 5-10 minutes 3-5 times daily. °Take antibiotic as prescribed with food - important to complete course. °Return for worsening pain, redness, swelling, discharge, fever. ° °Helpful prevention tips: °Keep nails short to avoid secondary skin infections. °Use new, clean razors when shaving. °

## 2019-07-04 ENCOUNTER — Ambulatory Visit
Admission: EM | Admit: 2019-07-04 | Discharge: 2019-07-04 | Disposition: A | Discharge status: Home / Self Care | Payer: Medicaid Other | Attending: Emergency Medicine | Admitting: Emergency Medicine

## 2019-07-04 ENCOUNTER — Encounter: Payer: Self-pay | Admitting: Emergency Medicine

## 2019-07-04 DIAGNOSIS — L309 Dermatitis, unspecified: Secondary | ICD-10-CM

## 2019-07-04 MED ORDER — TRIAMCINOLONE ACETONIDE 0.5 % EX OINT: 1.0000 "application " | TOPICAL_OINTMENT | Freq: Two times a day (BID) | CUTANEOUS | 0 refills | Status: DC

## 2019-07-04 NOTE — Discharge Instructions (Addendum)
Keep skin clean and dry. Apply triamcinolone ointment twice a day for 1 week, then may use 2-3 times a week thereafter. Please remember that long-term use of steroid creams can let your skin. Avoid hot showers as this can further irritate and dry skin. Return for worsening rash, pain, warmth, fevers.

## 2019-07-04 NOTE — ED Triage Notes (Signed)
Pt c/o rash to back of her neck for over a month, now is on her back.

## 2019-07-04 NOTE — ED Provider Notes (Signed)
[facility]    CSN: [id number] Arrival date & time: 07/04/19  1520      History   Chief Complaint Chief Complaint  Patient presents with  . Rash    HPI Sharon Waller is a 24 y.o. female with history of mild intermittent asthma, allergies presenting for rash.  States has been there for over a month: Initially started on the back of her neck, though has spread to her back states the back of her neck initially was itching, though does not itch anymore.  Denies change in topical products such as shampoo, detergents, lotions.  No known bug bites.  Denies fever, chills, arthralgias, myalgias.   Past Medical History:  Diagnosis Date  . Abdominal pain affecting pregnancy, antepartum 09/26/2015   [redacted]w[redacted]d on 09/23/15   . Allergic rhinitis 07/11/2018  . Asthma   . Bacterial vaginosis   . Bronchitis   . Choroid plexus cyst of fetus 01/07/2016  . Mild intermittent asthma 07/11/2018  . SVD (spontaneous vaginal delivery) 05/04/2016  . UTI (lower urinary tract infection)     Patient Active Problem List   Diagnosis Date Noted  . Palpitations 07/17/2018  . SOB (shortness of breath) 07/17/2018  . Chest pain of uncertain etiology 29/52/8413  . Mild intermittent asthma 07/11/2018  . Allergic rhinitis 07/11/2018    Past Surgical History:  Procedure Laterality Date  . NO PAST SURGERIES      OB History    Gravida  2   Para  1   Term  1   Preterm  0   AB  0   Living  1     SAB  0   TAB  0   Ectopic  0   Multiple  0   Live Births  1            Home Medications    Prior to Admission medications   Medication Sig Start Date End Date Taking? Authorizing Provider  cetirizine (ZYRTEC) 10 MG tablet Take 1 tablet (10 mg total) by mouth daily. 08/08/18   Kennith Gain, MD  fluticasone (FLOVENT HFA) 110 MCG/ACT inhaler Inhale 2 puffs into the lungs 2 (two) times daily. 08/08/18   Kennith Gain, MD  levalbuterol [facility] HFA) 45 MCG/ACT  inhaler Inhale 2 puffs into the lungs every 4 (four) hours as needed. 08/08/18   Kennith Gain, MD  loratadine (CLARITIN) 10 MG tablet Take 1 tablet (10 mg total) by mouth daily. To help with headache/congestion 07/25/18   Fulp, Cammie, MD  triamcinolone ointment (KENALOG) 0.5 % Apply 1 application topically 2 (two) times daily. 07/04/19   Hall-Potvin, [place], PA-C    Family History Family History  Problem Relation Age of Onset  . Cancer Maternal Aunt   . Cancer Maternal Grandmother   . Breast cancer Maternal Grandmother   . Allergic rhinitis Father   . Asthma Brother        as child  . Allergic rhinitis Paternal Grandmother   . Allergic rhinitis Paternal Grandfather     Social History Social History   Tobacco Use  . Smoking status: Former Smoker    Types: Cigarettes    Quit date: 09/30/2015    Years since quitting: 3.7  . Smokeless tobacco: Never Used  Substance Use Topics  . Alcohol use: No  . Drug use: No     Allergies   Augmentin [amoxicillin-pot clavulanate]   Review of Systems As per HPI   Physical Exam Triage Vital  Signs ED Triage Vitals  Enc Vitals Group     BP      Pulse      Resp      Temp      Temp src      SpO2      Weight      Height      Head Circumference      Peak Flow      Pain Score      Pain Loc      Pain Edu?      Excl. in [place]?    No data found.  Updated Vital Signs BP 115/78 (BP Location: Left Arm)   Pulse (!) 58   Temp 98.3 F (36.8 C) (Oral)   Resp 16   LMP 06/15/2019   SpO2 98%   Visual Acuity Right Eye Distance:   Left Eye Distance:   Bilateral Distance:    Right Eye Near:   Left Eye Near:    Bilateral Near:     Physical Exam Constitutional:      General: She is not in acute distress. HENT:     Head: Normocephalic and atraumatic.  Eyes:     General: No scleral icterus.    Pupils: Pupils are equal, round, and reactive to light.  Cardiovascular:     Rate and Rhythm: Normal rate.  Pulmonary:      Effort: Pulmonary effort is normal.  Skin:    Coloration: Skin is not jaundiced or pale.     Findings: Rash present.     Comments: 2.5 cm irregular area of dry skin dermatitis on back of neck as well as 1.5 cm irregular, similar lesion mid back.  Lesions are nontender, without open wounds, or active discharge.  Neurological:     Mental Status: She is alert and oriented to person, place, and time.      UC Treatments / Results  Labs (all labs ordered are listed, but only abnormal results are displayed) Labs Reviewed - No data to display  EKG   Radiology No results found.  Procedures Procedures (including critical care time)  Medications Ordered in UC Medications - No data to display  Initial Impression / Assessment and Plan / UC Course  I have reviewed the triage vital signs and the nursing notes.  Pertinent labs & imaging results that were available during my care of the patient were reviewed by me and considered in my medical decision making (see chart for details).     H&P consistent with dry skin dermatitis.  Given patient's history of asthma and allergies, likely eczematous.  Will trial triamcinolone, skin hygiene as outlined below, and follow-up with PCP if needed. Final Clinical Impressions(s) / UC Diagnoses   Final diagnoses:  Dermatitis     Discharge Instructions     Keep skin clean and dry. Apply triamcinolone ointment twice a day for 1 week, then may use 2-3 times a week thereafter. Please remember that long-term use of steroid creams can let your skin. Avoid hot showers as this can further irritate and dry skin. Return for worsening rash, pain, warmth, fevers.    ED Prescriptions    Medication Sig Dispense Auth. Provider   triamcinolone ointment (KENALOG) 0.5 % Apply 1 application topically 2 (two) times daily. 30 g Hall-Potvin, [place], PA-C     PDMP not reviewed this encounter.   Hall-Potvin, [place], [place] 07/04/19 1538

## 2019-08-05 ENCOUNTER — Ambulatory Visit
Admission: EM | Admit: 2019-08-05 | Discharge: 2019-08-05 | Disposition: A | Discharge status: Home / Self Care | Payer: Medicaid Other | Attending: Physician Assistant | Admitting: Physician Assistant

## 2019-08-05 DIAGNOSIS — N760 Acute vaginitis: Secondary | ICD-10-CM

## 2019-08-05 MED ORDER — METRONIDAZOLE 500 MG PO TABS: 500.0000 mg | ORAL_TABLET | Freq: Two times a day (BID) | ORAL | 0 refills | Status: DC

## 2019-08-05 NOTE — Discharge Instructions (Signed)
You were treated empirically for BV, flagyl as directed. Refrain from sexual activity and alcohol use for the next 7 days. Monitor for any worsening of symptoms, fever, abdominal pain, nausea, vomiting, to follow up for reevaluation.

## 2019-08-05 NOTE — ED Provider Notes (Signed)
[facility]    CSN: [id number] Arrival date & time: 08/05/19  1059      History   Chief Complaint Chief Complaint  Patient presents with  . Vaginal Discharge    HPI Sharon Waller is a 24 y.o. female.   24 year old female comes in for 1 week history of vaginal discharge with odor. Has history of BV and feels the same. Recently changed laundry detergent. Denies other symptoms such as urinary symptoms, fever, abdominal pain. LMP 07/26/2019. Sexually active with one female partner.     Past Medical History:  Diagnosis Date  . Abdominal pain affecting pregnancy, antepartum 09/26/2015   [redacted]w[redacted]d on 09/23/15   . Allergic rhinitis 07/11/2018  . Asthma   . Bacterial vaginosis   . Bronchitis   . Choroid plexus cyst of fetus 01/07/2016  . Mild intermittent asthma 07/11/2018  . SVD (spontaneous vaginal delivery) 05/04/2016  . UTI (lower urinary tract infection)     Patient Active Problem List   Diagnosis Date Noted  . Palpitations 07/17/2018  . SOB (shortness of breath) 07/17/2018  . Chest pain of uncertain etiology 61/60/7371  . Mild intermittent asthma 07/11/2018  . Allergic rhinitis 07/11/2018    Past Surgical History:  Procedure Laterality Date  . NO PAST SURGERIES      OB History    Gravida  2   Para  1   Term  1   Preterm  0   AB  0   Living  1     SAB  0   TAB  0   Ectopic  0   Multiple  0   Live Births  1            Home Medications    Prior to Admission medications   Medication Sig Start Date End Date Taking? Authorizing Provider  cetirizine (ZYRTEC) 10 MG tablet Take 1 tablet (10 mg total) by mouth daily. 08/08/18   Kennith Gain, MD  fluticasone (FLOVENT HFA) 110 MCG/ACT inhaler Inhale 2 puffs into the lungs 2 (two) times daily. 08/08/18   Kennith Gain, MD  metroNIDAZOLE (FLAGYL) 500 MG tablet Take 1 tablet (500 mg total) by mouth 2 (two) times daily. 08/05/19   Ok Edwards, PA-C    Family  History Family History  Problem Relation Age of Onset  . Cancer Maternal Aunt   . Cancer Maternal Grandmother   . Breast cancer Maternal Grandmother   . Allergic rhinitis Father   . Asthma Brother        as child  . Allergic rhinitis Paternal Grandmother   . Allergic rhinitis Paternal Grandfather     Social History Social History   Tobacco Use  . Smoking status: Former Smoker    Types: Cigarettes    Quit date: 09/30/2015    Years since quitting: 3.8  . Smokeless tobacco: Never Used  Substance Use Topics  . Alcohol use: No  . Drug use: No     Allergies   Augmentin [amoxicillin-pot clavulanate]   Review of Systems Review of Systems  Reason unable to perform ROS: See HPI as above.     Physical Exam Triage Vital Signs ED Triage Vitals  Enc Vitals Group     BP 08/05/19 1104 118/77     Pulse Rate 08/05/19 1104 63     Resp 08/05/19 1104 16     Temp 08/05/19 1104 98.5 F (36.9 C)     Temp Source 08/05/19 1104 Oral  SpO2 08/05/19 1104 98 %     Weight --      Height --      Head Circumference --      Peak Flow --      Pain Score 08/05/19 1112 0     Pain Loc --      Pain Edu? --      Excl. in GC? --    No data found.  Updated Vital Signs BP 118/77 (BP Location: Left Arm)   Pulse 63   Temp 98.5 F (36.9 C) (Oral)   Resp 16   LMP 07/26/2019   SpO2 98%   Physical Exam Constitutional:      General: She is not in acute distress.    Appearance: Normal appearance. She is well-developed. She is not toxic-appearing or diaphoretic.  HENT:     Head: Normocephalic and atraumatic.  Eyes:     Conjunctiva/sclera: Conjunctivae normal.     Pupils: Pupils are equal, round, and reactive to light.  Pulmonary:     Effort: Pulmonary effort is normal. No respiratory distress.     Comments: Speaking in full sentences without difficulty Musculoskeletal:     Cervical back: Normal range of motion and neck supple.  Skin:    General: Skin is warm and dry.  Neurological:      Mental Status: She is alert and oriented to person, place, and time.      UC Treatments / Results  Labs (all labs ordered are listed, but only abnormal results are displayed) Labs Reviewed - No data to display  EKG   Radiology No results found.  Procedures Procedures (including critical care time)  Medications Ordered in UC Medications - No data to display  Initial Impression / Assessment and Plan / UC Course  I have reviewed the triage vital signs and the nursing notes.  Pertinent labs & imaging results that were available during my care of the patient were reviewed by me and considered in my medical decision making (see chart for details).    Will treat for BV with flagyl. Avoid new hygiene product changes. Return precautions given.  Final Clinical Impressions(s) / UC Diagnoses   Final diagnoses:  Acute vaginitis    ED Prescriptions    Medication Sig Dispense Auth. Provider   metroNIDAZOLE (FLAGYL) 500 MG tablet Take 1 tablet (500 mg total) by mouth 2 (two) times daily. 14 tablet Belinda Fisher, PA-C     PDMP not reviewed this encounter.   Belinda Fisher, PA-C 08/05/19 1141

## 2019-08-05 NOTE — ED Triage Notes (Signed)
Pt c/o white vaginal discharge with an odor x1wk. States hx of BV and feels the same. States has had same sex partner for 55yrs, no concerns of STD.

## 2019-09-16 NOTE — Progress Notes (Signed)
Cardiology Office Note Date:  09/16/2019  Patient ID:  Sharon Waller, Sharon Waller 12-11-1995, MRN [id number] PCP:  Antony Blackbird, MD  Cardiologist:  Dr. Radford Pax Electrophysiologist: Dr. Rayann Heman    Chief Complaint: follow up   History of Present Illness: Sharon Waller is a 24 y.o. female with history of asthma referred to Dr. Radford Pax for c/o palpitations and atypical CP.  Her felt most c/w musculoskeletal with reproducibility and resolution after steroid and NSAIDs.  Much less likely pericarditis with normal ESR and history inconsistent with this, echo was ordered.  Her palpitations sounded random, occurred at rest when supine, as well as with simple exertion like standing up.  She had a couple ER visist that suggested they were secondary to anxiety, perhaps her asthma with a component of SOB.  The patient did not feel either of these were the issue. And a ZIo monitor was ordered.  Echo noted LVEF 55%, mod thickening of MV leaflets only trivial MR.  Longterm monitor noted few breif tachycardia episodes.and referred to EP. She saw Dr. Rayann Heman via a telehealth visit 09/12/2018, felt these may be very short and infrequent Atach episodes.  Her event monitor results felt to be benign with primarily sinus tachycardia, with rare, infrequent AT that is not amenable to ablation and not likely clinically significant. Discussed lifestyle modification at length, regular exercise, hydration, avoidance of caffeine and ETOH, stress management.  Started on diltiazem 129mdaily with plans to f/u in 6 weeks to re-assess.  10/2018  she reported she had some minimal improvement in her palpitations from feeling them all day every day with any exertion to having some good days, though still feels like she is sick.  Numerically she reports going from a 10/10 terrible to a 6/10. No CP, no near syncope or syncope.  With the racing she gets a fullness in her head, no near syncope or syncope.  The frequency and severity of her  symptoms seemed much different then what we saw on her longterm monitoring, I did not feel like she had BP room for uptitration and was not convinced tachycardia was of significant burden.  She was terribly frustrated, says until April her life was fine and felt great to being nearly unable to do anything without her heart racing.   She denied any ETOH drugs, stays away from sodas, no coffee.  She has a 2 year and is busy active carinig for her.  Feels like she drinks enough/good amount of water. Orthostatic vitas were negative  I made no changes at that visit, though afterwards did reach out to Dr. ARayann Hemanfor his thoughts.  He recommended confirming no marijuana/drug use and outside if this reassurance was about all we had to offer.  11/2018 She has had some slight improvement in her symptoms again, though still continues to feel that she is limited in her ability to be active 2/2 heart racing.  Though not all day every day anymore like it had been.  No near syncope or syncope. She assures no ETOH or drug use of any kind.  No smoking. She was quite frustrated regarding her symptoms, though she seemed to have had minimal slow improvement. We revisited life style strategies, rational for not increasing her medicine.   Given her frustration over lack of significant improvement and not feeling like she cannot live her life any more, we discussed re-monitoring her to better identify her symptom and correlation (or not) to HR/rhythm with follow up in a couple months.  Nov 2020 monitor Sinus rhythm Average HR 68 bpm (range 42-148 bpm) Rare sinus bradycardia with junctional escape No AV block or pauses No sustained arrhythmias   TODAY She is doing [facility] better.  Dec she noted she was feeling better and weaned herself off the diltiazem to try and avoid daily medication use and found that she did very well.  She has had a couple days that she is aware of some brief palpitations and or perhaps a sensation  of a skipped beat, she started using the diltiazem only on days that these bother her which are infrequent.  No CP or SOB, no near syncope or syncope. They have govne to [place] did "a ton of walking" and she felt quite well.  She is able to do her ADLs again without difficulty.  Negative orthostatics  Past Medical History:  Diagnosis Date  . Abdominal pain affecting pregnancy, antepartum 09/26/2015   [id number] 09/23/15   . Allergic rhinitis 07/11/2018  . Asthma   . Bacterial vaginosis   . Bronchitis   . Choroid plexus cyst of fetus 01/07/2016  . Mild intermittent asthma 07/11/2018  . SVD (spontaneous vaginal delivery) 05/04/2016  . UTI (lower urinary tract infection)     Past Surgical History:  Procedure Laterality Date  . NO PAST SURGERIES      Current Outpatient Medications  Medication Sig Dispense Refill  . cetirizine (ZYRTEC) 10 MG tablet Take 1 tablet (10 mg total) by mouth daily. 30 tablet 5  . fluticasone (FLOVENT HFA) 110 MCG/ACT inhaler Inhale 2 puffs into the lungs 2 (two) times daily. 1 Inhaler 5  . metroNIDAZOLE (FLAGYL) 500 MG tablet Take 1 tablet (500 mg total) by mouth 2 (two) times daily. 14 tablet 0   No current facility-administered medications for this visit.    Allergies:   Augmentin [amoxicillin-pot clavulanate]   Social History:  The patient  reports that she quit smoking about 3 years ago. Her smoking use included cigarettes. She has never used smokeless tobacco. She reports that she does not drink alcohol and does not use drugs.   Family History:  The patient's family history includes Allergic rhinitis in her father, paternal grandfather, and paternal grandmother; Asthma in her brother; Breast cancer in her maternal grandmother; Cancer in her maternal aunt and maternal grandmother.   ROS:  Please see the history of present illness.    All other systems are reviewed and otherwise negative.   PHYSICAL EXAM:  VS:  There were no vitals taken for  this visit. BMI: There is no height or weight on file to calculate BMI. Well nourished, well developed, healthy appearing female in no acute distress  HEENT: normocephalic, atraumatic  Neck: no JVD, carotid bruits or masses Cardiac:  RRR; no significant murmurs, no rubs, or gallops Lungs:   CTA b/l, no wheezing, rhonchi or rales  Abd: soft, nontender MS: no deformity or atrophy Ext: no edema  Skin: warm and dry, no rash Neuro:  No gross deficits appreciated Psych: euthymic mood, full affect    EKG: not done today  Nov 2020 monitor Sinus rhythm Average HR 68 bpm (range 42-148 bpm) Rare sinus bradycardia with junctional escape No AV block or pauses No sustained arrhythmias   08/21/2018: Long term monitor  Bradycardia to sinus tachycardia.  29 Supraventricular Tachycardias the longest lasting 12 seconds which rate 182 bpm.   Bradycardia to sinus tachycardia. 29 Supraventricular Tachycardias the longest lasting 12 seconds which rate 182 bpm.  This  possibly represent atrial tachycardia, consider referral to EP.   07/19/2018: TTE IMPRESSIONS  1. The left ventricle has a visually estimated ejection fraction of 55%. The cavity size was normal. Left ventricular diastolic parameters were normal.  2. The right ventricle has normal systolic function. The cavity was normal. There is no increase in right ventricular wall thickness.  3. The mitral valve is degenerative. Moderate thickening of the mitral valve leaflet. Mild calcification of the mitral valve leaflet.  4. The aortic valve is tricuspid. Mild thickening of the aortic valve. Mild calcification of the aortic valve.  FINDINGS  Left Ventricle: The left ventricle has a visually estimated ejection fraction of 55. The cavity size was normal. There is no increase in left ventricular wall thickness. Left ventricular diastolic parameters were normal.  Right Ventricle: The right ventricle has normal systolic function. The cavity was  normal. There is no increase in right ventricular wall thickness.  Left Atrium: Left atrial size was normal in size.  Right Atrium: Right atrial size was normal in size. Right atrial pressure is estimated at 10 mmHg.  Interatrial Septum: No atrial level shunt detected by color flow Doppler.  Pericardium: There is no evidence of pericardial effusion.  Mitral Valve: The mitral valve is degenerative in appearance. Moderate thickening of the mitral valve leaflet. Mild calcification of the mitral valve leaflet. Mitral valve regurgitation is trivial by color flow Doppler.  Tricuspid Valve: The tricuspid valve is normal in structure. Tricuspid valve regurgitation is mild by color flow Doppler.  Aortic Valve: The aortic valve is tricuspid Mild thickening of the aortic valve. Mild calcification of the aortic valve. Aortic valve regurgitation was not visualized by color flow Doppler. There is no evidence of aortic valve stenosis.  Pulmonic Valve: The pulmonic valve was grossly normal. Pulmonic valve regurgitation is mild by color flow Doppler.  Venous: The inferior vena cava is normal in size with greater than 50% respiratory variability.  Recent Labs: No results found for requested labs within last 8760 hours.  No results found for requested labs within last 8760 hours.   CrCl cannot be calculated (Patient's most recent lab result is older than the maximum 21 days allowed.).   Wt Readings from Last 3 Encounters:  11/14/18 131 lb (59.4 kg)  10/17/18 133 lb (60.3 kg)  09/12/18 131 lb (59.4 kg)     Other studies reviewed: Additional studies/records reviewed today include: summarized above  ASSESSMENT AND PLAN:  1. Palpitations, SOB     Sinus tach, A tach     Discussed monitor findings/bening nature of her ectopy/findings  She is doing MUCH better using the diltiazem only as needed and infrequently with success She will continue this strategy Re-discussed lifestyle strategies     She denies current pregnancy but does plan another in the future. Discussed not to use diltiazem during pregnancy, when she decides to plan a pregnancy or if she finds herself pregnant to stop.  She states understanding.    Disposition: offered an annual follow up, she would like a 19moto check in, we will see her then, sooner if needed.  Current medicines are reviewed at length with the patient today.  The patient did not have any concerns regarding medicines.  SVenetia Night PA-C 09/16/2019 4:52 PM     [place] [id number]([phone](office)  [phone](fax)

## 2019-09-18 ENCOUNTER — Ambulatory Visit: Payer: Medicaid Other | Admitting: Physician Assistant

## 2019-09-18 ENCOUNTER — Other Ambulatory Visit: Payer: Self-pay

## 2019-09-18 VITALS — BP 100/68 | HR 68 | Ht 66.0 in | Wt 137.0 lb

## 2019-09-18 DIAGNOSIS — R000 Tachycardia, unspecified: Secondary | ICD-10-CM | POA: Diagnosis not present

## 2019-09-18 DIAGNOSIS — I471 Supraventricular tachycardia: Secondary | ICD-10-CM

## 2019-09-18 DIAGNOSIS — R002 Palpitations: Secondary | ICD-10-CM

## 2019-09-18 NOTE — Patient Instructions (Signed)
Medication Instructions:   Your physician recommends that you continue on your current medications as directed. Please refer to the Current Medication list given to you today.  *If you need a refill on your cardiac medications before your next appointment, please call your pharmacy*   Lab Work: NONE ORDERED  TODAY   If you have labs (blood work) drawn today and your tests are completely normal, you will receive your results only by: . MyChart Message (if you have MyChart) OR . A paper copy in the mail If you have any lab test that is abnormal or we need to change your treatment, we will call you to review the results.   Testing/Procedures: NONE ORDERED  TODAY   Follow-Up: At [facility], you and your health needs are our priority.  As part of our continuing mission to provide you with exceptional heart care, we have created designated Provider Care Teams.  These Care Teams include your primary Cardiologist (physician) and Advanced Practice Providers (APPs -  Physician Assistants and Nurse Practitioners) who all work together to provide you with the care you need, when you need it.  We recommend signing up for the patient portal called "MyChart".  Sign up information is provided on this After Visit Summary.  MyChart is used to connect with patients for Virtual Visits (Telemedicine).  Patients are able to view lab/test results, encounter notes, upcoming appointments, etc.  Non-urgent messages can be sent to your provider as well.   To learn more about what you can do with MyChart, go to [email or web address].    Your next appointment:   6 month(s)  The format for your next appointment:   In Person  Provider:   You may see Dr. Klein  or one of the following Advanced Practice Providers on your designated Care Team:    Amber Seiler, NP  Renee Ursuy, PA-C  Michael "Andy" Tillery, PA-C    Other Instructions   

## 2019-10-01 ENCOUNTER — Ambulatory Visit
Admission: EM | Admit: 2019-10-01 | Discharge: 2019-10-01 | Disposition: A | Discharge status: Home / Self Care | Payer: Medicaid Other | Attending: Physician Assistant | Admitting: Physician Assistant

## 2019-10-01 DIAGNOSIS — Z3201 Encounter for pregnancy test, result positive: Secondary | ICD-10-CM | POA: Diagnosis not present

## 2019-10-01 LAB — POCT URINE PREGNANCY: Preg Test, Ur: POSITIVE — AB

## 2019-10-01 NOTE — Discharge Instructions (Signed)
Urine pregnancy positive. Follow up with OB for further evaluation. If having vaginal bleeding, abdominal pain, go to women's ED for further evaluation.

## 2019-10-01 NOTE — ED Triage Notes (Signed)
Patient is here today to have a pregnancy test done. Patient states she took a Plan B sometime in June and another Plan B on 09/20/19. Patients states she took two home pregnancies today - 1 - Negative and the other Positive. Patient states she took the 2nd Plan B right after having sexual intercourse on 09/20/19.

## 2019-10-01 NOTE — ED Provider Notes (Addendum)
[facility]    CSN: [id number] Arrival date & time: 10/01/19  1421      History   Chief Complaint Chief Complaint  Patient presents with  . Possible Pregnancy    HPI Sharon Waller is a 24 y.o. female.   24 year old female comes in for possible pregnancy.  States had 2 pregnancy test done at home, one positive, one negative.  Took Plan B twice in the past 2 months after intercourse.  Currently with nausea, breast tenderness, urinary frequency, for which she says is also normal during PMS.  Denies abdominal pain, vaginal bleeding.  Denies dysuria, hematuria.  Mild vaginal discharge.     Past Medical History:  Diagnosis Date  . Abdominal pain affecting pregnancy, antepartum 09/26/2015   [redacted]w[redacted]d on 09/23/15   . Allergic rhinitis 07/11/2018  . Asthma   . Bacterial vaginosis   . Bronchitis   . Choroid plexus cyst of fetus 01/07/2016  . Mild intermittent asthma 07/11/2018  . SVD (spontaneous vaginal delivery) 05/04/2016  . UTI (lower urinary tract infection)     Patient Active Problem List   Diagnosis Date Noted  . Palpitations 07/17/2018  . SOB (shortness of breath) 07/17/2018  . Chest pain of uncertain etiology 07/17/2018  . Mild intermittent asthma 07/11/2018  . Allergic rhinitis 07/11/2018    Past Surgical History:  Procedure Laterality Date  . NO PAST SURGERIES      OB History    Gravida  2   Para  1   Term  1   Preterm  0   AB  0   Living  1     SAB  0   TAB  0   Ectopic  0   Multiple  0   Live Births  1            Home Medications    Prior to Admission medications   Medication Sig Start Date End Date Taking? Authorizing Provider  cetirizine (ZYRTEC) 10 MG tablet Take 1 tablet (10 mg total) by mouth daily. 08/08/18   Marcelyn Bruins, MD  diltiazem (CARDIZEM) 120 MG tablet Take 120 mg by mouth as needed (for palpitations).    [provider]  fluticasone (FLOVENT HFA) 110 MCG/ACT inhaler Inhale 2 puffs  into the lungs 2 (two) times daily. 08/08/18   Marcelyn Bruins, MD    Family History Family History  Problem Relation Age of Onset  . Cancer Maternal Aunt   . Cancer Maternal Grandmother   . Breast cancer Maternal Grandmother   . Allergic rhinitis Father   . Asthma Brother        as child  . Allergic rhinitis Paternal Grandmother   . Allergic rhinitis Paternal Grandfather     Social History Social History   Tobacco Use  . Smoking status: Former Smoker    Types: Cigarettes    Quit date: 09/30/2015    Years since quitting: 4.0  . Smokeless tobacco: Never Used  Vaping Use  . Vaping Use: Never used  Substance Use Topics  . Alcohol use: No  . Drug use: No     Allergies   Augmentin [amoxicillin-pot clavulanate]   Review of Systems Review of Systems  Reason unable to perform ROS: See HPI as above.     Physical Exam Triage Vital Signs ED Triage Vitals  Enc Vitals Group     BP 10/01/19 1512 109/74     Pulse Rate 10/01/19 1512 63     Resp  10/01/19 1512 16     Temp 10/01/19 1512 98.5 F (36.9 C)     Temp Source 10/01/19 1512 Oral     SpO2 10/01/19 1512 95 %     Weight --      Height --      Head Circumference --      Peak Flow --      Pain Score 10/01/19 1515 0     Pain Loc --      Pain Edu? --      Excl. in GC? --    No data found.  Updated Vital Signs BP 109/74 (BP Location: Left Arm)   Pulse 63   Temp 98.5 F (36.9 C) (Oral)   Resp 16   LMP 09/04/2019 (Exact Date)   SpO2 95%   Physical Exam Constitutional:      General: She is not in acute distress.    Appearance: Normal appearance. She is well-developed. She is not toxic-appearing or diaphoretic.  HENT:     Head: Normocephalic and atraumatic.  Eyes:     Conjunctiva/sclera: Conjunctivae normal.     Pupils: Pupils are equal, round, and reactive to light.  Pulmonary:     Effort: Pulmonary effort is normal. No respiratory distress.  Musculoskeletal:     Cervical back: Normal range of  motion and neck supple.  Skin:    General: Skin is warm and dry.  Neurological:     Mental Status: She is alert and oriented to person, place, and time.    UC Treatments / Results  Labs (all labs ordered are listed, but only abnormal results are displayed) Labs Reviewed  POCT URINE PREGNANCY - Abnormal; Notable for the following components:      Result Value   Preg Test, Ur Positive (*)    All other components within normal limits    EKG   Radiology No results found.  Procedures Procedures (including critical care time)  Medications Ordered in UC Medications - No data to display  Initial Impression / Assessment and Plan / UC Course  I have reviewed the triage vital signs and the nursing notes.  Pertinent labs & imaging results that were available during my care of the patient were reviewed by me and considered in my medical decision making (see chart for details).    Urine pregnancy test positive. Patient states "not keeping it". Had full [facility] at  [facility] earlier today, so did not repeat. Discussed return precautions. Otherwise to follow up with OB for further evaluations.  Final Clinical Impressions(s) / UC Diagnoses   Final diagnoses:  Positive urine pregnancy test    ED Prescriptions    None     PDMP not reviewed this encounter.   Belinda Fisher, PA-C 10/01/19 1533    Linward Headland V, PA-C 10/01/19 1534

## 2019-12-13 ENCOUNTER — Encounter: Payer: Self-pay | Admitting: Family Medicine

## 2019-12-13 ENCOUNTER — Other Ambulatory Visit: Payer: Self-pay

## 2019-12-13 ENCOUNTER — Ambulatory Visit
Admission: EM | Admit: 2019-12-13 | Discharge: 2019-12-13 | Disposition: A | Discharge status: Home / Self Care | Payer: Medicaid Other | Attending: Emergency Medicine | Admitting: Emergency Medicine

## 2019-12-13 ENCOUNTER — Ambulatory Visit: Payer: Medicaid Other | Attending: Family Medicine | Admitting: Family Medicine

## 2019-12-13 VITALS — BP 109/75 | HR 79 | Temp 99.1°F | Ht 66.0 in | Wt 135.0 lb

## 2019-12-13 DIAGNOSIS — Z881 Allergy status to other antibiotic agents status: Secondary | ICD-10-CM | POA: Diagnosis not present

## 2019-12-13 DIAGNOSIS — Z880 Allergy status to penicillin: Secondary | ICD-10-CM | POA: Insufficient documentation

## 2019-12-13 DIAGNOSIS — J309 Allergic rhinitis, unspecified: Secondary | ICD-10-CM

## 2019-12-13 DIAGNOSIS — Z87891 Personal history of nicotine dependence: Secondary | ICD-10-CM | POA: Insufficient documentation

## 2019-12-13 DIAGNOSIS — Z760 Encounter for issue of repeat prescription: Secondary | ICD-10-CM | POA: Insufficient documentation

## 2019-12-13 DIAGNOSIS — Z1152 Encounter for screening for COVID-19: Secondary | ICD-10-CM

## 2019-12-13 DIAGNOSIS — Z79899 Other long term (current) drug therapy: Secondary | ICD-10-CM | POA: Diagnosis not present

## 2019-12-13 MED ORDER — CETIRIZINE HCL 10 MG PO TABS: 10.0000 mg | ORAL_TABLET | Freq: Every day | ORAL | 3 refills | Status: DC

## 2019-12-13 NOTE — ED Triage Notes (Signed)
Pt here for covid exposure; denies sx but needs testing

## 2019-12-13 NOTE — Patient Instructions (Signed)
Allergic Rhinitis, Adult Allergic rhinitis is a reaction to allergens in the air. Allergens are tiny specks (particles) in the air that cause your body to have an allergic reaction. This condition cannot be passed from person to person (is not contagious). Allergic rhinitis cannot be cured, but it can be controlled. There are two types of allergic rhinitis:  Seasonal. This type is also called hay fever. It happens only during certain times of the year.  Perennial. This type can happen at any time of the year. What are the causes? This condition may be caused by:  Pollen from grasses, trees, and weeds.  House dust mites.  Pet dander.  Mold. What are the signs or symptoms? Symptoms of this condition include:  Sneezing.  Runny or stuffy nose (nasal congestion).  A lot of mucus in the back of the throat (postnasal drip).  Itchy nose.  Tearing of the eyes.  Trouble sleeping.  Being sleepy during day. How is this treated? There is no cure for this condition. You should avoid things that trigger your symptoms (allergens). Treatment can help to relieve symptoms. This may include:  Medicines that block allergy symptoms, such as antihistamines. These may be given as a shot, nasal spray, or pill.  Shots that are given until your body becomes less sensitive to the allergen (desensitization).  Stronger medicines, if all other treatments have not worked. Follow these instructions at home: Avoiding allergens   Find out what you are allergic to. Common allergens include smoke, dust, and pollen.  Avoid them if you can. These are some of the things that you can do to avoid allergens: ? Replace carpet with wood, tile, or vinyl flooring. Carpet can trap dander and dust. ? Clean any mold found in the home. ? Do not smoke. Do not allow smoking in your home. ? Change your heating and air conditioning filter at least once a month. ? During allergy season:  Keep windows closed as much as  you can. If possible, use air conditioning when there is a lot of pollen in the air.  Use a special filter for allergies with your furnace and air conditioner.  Plan outdoor activities when pollen counts are lowest. This is usually during the early morning or evening hours.  If you do go outdoors when pollen count is high, wear a special mask for people with allergies.  When you come indoors, take a shower and change your clothes before sitting on furniture or bedding. General instructions  Do not use fans in your home.  Do not hang clothes outside to dry.  Wear sunglasses to keep pollen out of your eyes.  Wash your hands right away after you touch household pets.  Take over-the-counter and prescription medicines only as told by your doctor.  Keep all follow-up visits as told by your doctor. This is important. Contact a doctor if:  You have a fever.  You have a cough that does not go away (is persistent).  You start to make whistling sounds when you breathe (wheeze).  Your symptoms do not get better with treatment.  You have thick fluid coming from your nose.  You start to have nosebleeds. Get help right away if:  Your tongue or your lips are swollen.  You have trouble breathing.  You feel dizzy or you feel like you are going to pass out (faint).  You have cold sweats. Summary  Allergic rhinitis is a reaction to allergens in the air.  This condition may be   caused by allergens. These include pollen, dust mites, pet dander, and mold.  Symptoms include a runny, itchy nose, sneezing, or tearing eyes. You may also have trouble sleeping or feel sleepy during the day.  Treatment includes taking medicines and avoiding allergens. You may also get shots or take stronger medicines.  Get help if you have a fever or a cough that does not stop. Get help right away if you are short of breath. This information is not intended to replace advice given to you by your health care  provider. Make sure you discuss any questions you have with your health care provider. Document Revised: 06/19/2018 Document Reviewed: 09/19/2017 Elsevier Patient Education  2020 Elsevier Inc.  

## 2019-12-13 NOTE — Progress Notes (Signed)
Established Patient Office Visit  Subjective:  Patient ID: [name], female    DOB: 07/28/1995  Age: 24 y.o. MRN: [id number]  CC:  Chief Complaint  Patient presents with  . Medication Refill    HPI Safina I Thoennes, 24 year old female, who presents secondary to complaint of recent increase in nasal congestion and postnasal drainage consistent with previous allergy symptoms.  She would like to receive a refill of Zyrtec.  She denies any other issues or complaints at today's visit.  She has had no issues with sore throat, no ear pain or pressure, no shortness of breath or cough and no wheezing.  Past Medical History:  Diagnosis Date  . Abdominal pain affecting pregnancy, antepartum 09/26/2015   [redacted]w[redacted]d on 09/23/15   . Allergic rhinitis 07/11/2018  . Asthma   . Bacterial vaginosis   . Bronchitis   . Choroid plexus cyst of fetus 01/07/2016  . Mild intermittent asthma 07/11/2018  . SVD (spontaneous vaginal delivery) 05/04/2016  . UTI (lower urinary tract infection)     Past Surgical History:  Procedure Laterality Date  . NO PAST SURGERIES      Family History  Problem Relation Age of Onset  . Cancer Maternal Aunt   . Cancer Maternal Grandmother   . Breast cancer Maternal Grandmother   . Allergic rhinitis Father   . Asthma Brother        as child  . Allergic rhinitis Paternal Grandmother   . Allergic rhinitis Paternal Grandfather     Social History   Socioeconomic History  . Marital status: Single    Spouse name: Not on file  . Number of children: Not on file  . Years of education: Not on file  . Highest education level: Not on file  Occupational History  . Not on file  Tobacco Use  . Smoking status: Former Smoker    Types: Cigarettes    Quit date: 09/30/2015    Years since quitting: 4.2  . Smokeless tobacco: Never Used  Vaping Use  . Vaping Use: Never used  Substance and Sexual Activity  . Alcohol use: No  . Drug use: No  . Sexual activity: Yes    Birth  control/protection: None    Comment: last sex 19 Feb 18  Other Topics Concern  . Not on file  Social History Narrative  . Not on file   Social Determinants of Health   Financial Resource Strain:   . Difficulty of Paying Living Expenses: Not on file  Food Insecurity:   . Worried About Programme researcher, broadcasting/film/video in the Last Year: Not on file  . Ran Out of Food in the Last Year: Not on file  Transportation Needs:   . Lack of Transportation (Medical): Not on file  . Lack of Transportation (Non-Medical): Not on file  Physical Activity:   . Days of Exercise per Week: Not on file  . Minutes of Exercise per Session: Not on file  Stress:   . Feeling of Stress : Not on file  Social Connections:   . Frequency of Communication with Friends and Family: Not on file  . Frequency of Social Gatherings with Friends and Family: Not on file  . Attends Religious Services: Not on file  . Active Member of Clubs or Organizations: Not on file  . Attends Banker Meetings: Not on file  . Marital Status: Not on file  Intimate Partner Violence:   . Fear of Current or Ex-Partner: Not on file  .  Emotionally Abused: Not on file  . Physically Abused: Not on file  . Sexually Abused: Not on file    Outpatient Medications Prior to Visit  Medication Sig Dispense Refill  . cetirizine (ZYRTEC) 10 MG tablet Take 1 tablet (10 mg total) by mouth daily. 30 tablet 5  . diltiazem (CARDIZEM) 120 MG tablet Take 120 mg by mouth as needed (for palpitations).    . fluticasone (FLOVENT HFA) 110 MCG/ACT inhaler Inhale 2 puffs into the lungs 2 (two) times daily. (Patient not taking: Reported on 12/13/2019) 1 Inhaler 5   No facility-administered medications prior to visit.    Allergies  Allergen Reactions  . Augmentin [Amoxicillin-Pot Clavulanate] Rash    Has patient had a PCN reaction causing immediate rash, facial/tongue/throat swelling, SOB or lightheadedness with hypotension: Yes Has patient had a PCN reaction  causing severe rash involving mucus membranes or skin necrosis: No Has patient had a PCN reaction that required hospitalization Yes Has patient had a PCN reaction occurring within the last 10 years: No If all of the above answers are "NO", then may proceed with Cephalosporin use.     ROS Review of Systems  Constitutional: Negative for chills and fever.  HENT: Positive for congestion, postnasal drip and rhinorrhea. Negative for ear pain, sore throat and trouble swallowing.   Respiratory: Negative for cough, shortness of breath and wheezing.   Cardiovascular: Negative for chest pain and palpitations.  Gastrointestinal: Negative for abdominal pain and nausea.  Genitourinary: Negative for dysuria and frequency.  Neurological: Negative for dizziness and headaches.  Hematological: Negative for adenopathy. Does not bruise/bleed easily.      Objective:    Physical Exam Constitutional:      General: She is not in acute distress.    Appearance: Normal appearance.  HENT:     Right Ear: Tympanic membrane, ear canal and external ear normal.     Left Ear: Tympanic membrane, ear canal and external ear normal.     Nose: Congestion and rhinorrhea (Clear) present.     Mouth/Throat:     Pharynx: Posterior oropharyngeal erythema (Mild posterior pharynx erythema) present. No oropharyngeal exudate.  Eyes:     Extraocular Movements: Extraocular movements intact.     Conjunctiva/sclera: Conjunctivae normal.  Cardiovascular:     Rate and Rhythm: Normal rate and regular rhythm.  Pulmonary:     Effort: Pulmonary effort is normal.     Breath sounds: Normal breath sounds. No wheezing.  Abdominal:     Palpations: Abdomen is soft.     Tenderness: There is no abdominal tenderness. There is no guarding.  Musculoskeletal:     Cervical back: Normal range of motion and neck supple. No tenderness.  Lymphadenopathy:     Cervical: No cervical adenopathy.  Neurological:     Mental Status: She is alert.      BP 109/75 (BP Location: Right Arm, Patient Position: Sitting)   Pulse 79   Temp 99.1 F (37.3 C)   Ht 5\' 6"  (1.676 m)   Wt 135 lb (61.2 kg)   SpO2 100%   BMI 21.79 kg/m  Wt Readings from Last 3 Encounters:  12/13/19 135 lb (61.2 kg)  09/18/19 137 lb (62.1 kg)  11/14/18 131 lb (59.4 kg)     Health Maintenance Due  Topic Date Due  . Hepatitis C Screening  Never done  . PAP-Cervical Cytology Screening  06/10/2019  . PAP SMEAR-Modifier  06/10/2019    There are no preventive care reminders to display for this  patient.  Lab Results  Component Value Date   TSH 0.570 07/11/2018   Lab Results  Component Value Date   WBC 4.9 06/27/2018   HGB 12.8 06/27/2018   HCT 40.6 06/27/2018   MCV 92.9 06/27/2018   PLT 268 06/27/2018   Lab Results  Component Value Date   NA 138 06/27/2018   K 3.5 06/27/2018   CO2 26 06/27/2018   GLUCOSE 89 06/27/2018   BUN 8 06/27/2018   CREATININE 0.59 06/27/2018   BILITOT 0.6 06/27/2018   ALKPHOS 47 06/27/2018   AST 17 06/27/2018   ALT 13 06/27/2018   PROT 7.6 06/27/2018   ALBUMIN 4.1 06/27/2018   CALCIUM 9.3 06/27/2018   ANIONGAP 8 06/27/2018   No results found for: CHOL No results found for: HDL No results found for: LDLCALC No results found for: TRIG No results found for: CHOLHDL No results found for: MBTD9R    Assessment & Plan:  1. Allergic rhinitis, unspecified seasonality, unspecified trigger Refill provided of cetirizine for treatment of allergic rhinitis.  Influenza immunization offered but declined per patient at today's visit - cetirizine (ZYRTEC) 10 MG tablet; Take 1 tablet (10 mg total) by mouth daily.  Dispense: 90 tablet; Refill: 3     Follow-up: Return in about 6 months (around 06/12/2020) for allergic rhinitis- as needed and schedule yearly well exam when needde.   Cain Saupe, MD

## 2019-12-13 NOTE — Progress Notes (Signed)
Needs refill for zyrtec

## 2019-12-16 ENCOUNTER — Ambulatory Visit
Admission: EM | Admit: 2019-12-16 | Discharge: 2019-12-16 | Disposition: A | Discharge status: Home / Self Care | Payer: Medicaid Other | Attending: Emergency Medicine | Admitting: Emergency Medicine

## 2019-12-16 DIAGNOSIS — Z1152 Encounter for screening for COVID-19: Secondary | ICD-10-CM

## 2019-12-16 LAB — NOVEL CORONAVIRUS, NAA

## 2019-12-16 NOTE — ED Triage Notes (Signed)
Pt test was inconclusive so needed a reswab

## 2019-12-18 LAB — NOVEL CORONAVIRUS, NAA: SARS-CoV-2, NAA: NOT DETECTED

## 2019-12-18 LAB — SARS-COV-2, NAA 2 DAY TAT

## 2020-01-09 ENCOUNTER — Ambulatory Visit
Admission: EM | Admit: 2020-01-09 | Discharge: 2020-01-09 | Disposition: A | Discharge status: Home / Self Care | Payer: Medicaid Other | Attending: Emergency Medicine | Admitting: Emergency Medicine

## 2020-01-09 ENCOUNTER — Other Ambulatory Visit: Payer: Self-pay

## 2020-01-09 DIAGNOSIS — N760 Acute vaginitis: Secondary | ICD-10-CM | POA: Diagnosis present

## 2020-01-09 DIAGNOSIS — N3001 Acute cystitis with hematuria: Secondary | ICD-10-CM | POA: Diagnosis present

## 2020-01-09 LAB — POCT URINALYSIS DIP (MANUAL ENTRY)
Glucose, UA: NEGATIVE mg/dL
Nitrite, UA: NEGATIVE
Protein Ur, POC: 30 mg/dL — AB
Spec Grav, UA: >=1.03 — AB (ref 1.010–1.025)
Urobilinogen, UA: 1 E.U./dL
pH, UA: 6 (ref 5.0–8.0)

## 2020-01-09 LAB — POCT URINE PREGNANCY: Preg Test, Ur: NEGATIVE

## 2020-01-09 MED ORDER — FLUCONAZOLE 150 MG PO TABS: 150.0000 mg | ORAL_TABLET | Freq: Every day | ORAL | 0 refills | Status: DC

## 2020-01-09 MED ORDER — NITROFURANTOIN MONOHYD MACRO 100 MG PO CAPS: 100.0000 mg | ORAL_CAPSULE | Freq: Two times a day (BID) | ORAL | 0 refills | Status: AC

## 2020-01-09 NOTE — Discharge Instructions (Addendum)
Take antibiotic twice daily with food. Important to drink plenty of water throughout the day. Return for worsening urinary symptoms, blood in urine, abdominal or back pain, fever. 

## 2020-01-09 NOTE — ED Triage Notes (Signed)
Pt c/o vaginal itching since yesterday and now having discomfort when urinating. Requesting STD testing. States has a slight vaginal discharge.

## 2020-01-09 NOTE — ED Provider Notes (Signed)
[facility]    CSN: [id number] Arrival date & time: 01/09/20  1738      History   Chief Complaint Chief Complaint  Patient presents with  . Vaginal Itching    HPI Sharon Waller is a 24 y.o. female  Presenting for vaginal itching and discomfort when urinating.  Symptoms began yesterday.  Requesting STD testing as well: No known exposure.  Patient does have mild vaginal discharge.  Denies pelvic pain, abdominal or back pain, fever.  Did recently complete course of antibiotics due to dental procedure.  Past Medical History:  Diagnosis Date  . Abdominal pain affecting pregnancy, antepartum 09/26/2015   [redacted]w[redacted]d on 09/23/15   . Allergic rhinitis 07/11/2018  . Asthma   . Bacterial vaginosis   . Bronchitis   . Choroid plexus cyst of fetus 01/07/2016  . Mild intermittent asthma 07/11/2018  . SVD (spontaneous vaginal delivery) 05/04/2016  . UTI (lower urinary tract infection)     Patient Active Problem List   Diagnosis Date Noted  . Palpitations 07/17/2018  . SOB (shortness of breath) 07/17/2018  . Chest pain of uncertain etiology 07/17/2018  . Mild intermittent asthma 07/11/2018  . Allergic rhinitis 07/11/2018    Past Surgical History:  Procedure Laterality Date  . NO PAST SURGERIES      OB History    Gravida  2   Para  1   Term  1   Preterm  0   AB  0   Living  1     SAB  0   TAB  0   Ectopic  0   Multiple  0   Live Births  1            Home Medications    Prior to Admission medications   Medication Sig Start Date End Date Taking? Authorizing Provider  fluconazole (DIFLUCAN) 150 MG tablet Take 1 tablet (150 mg total) by mouth daily. May repeat in 72 hours if needed 01/09/20   Hall-Potvin, [place], PA-C  nitrofurantoin, macrocrystal-monohydrate, (MACROBID) 100 MG capsule Take 1 capsule (100 mg total) by mouth 2 (two) times daily for 3 days. 01/09/20 01/12/20  Hall-Potvin, [place], PA-C    Family History Family History   Problem Relation Age of Onset  . Cancer Maternal Aunt   . Cancer Maternal Grandmother   . Breast cancer Maternal Grandmother   . Allergic rhinitis Father   . Asthma Brother        as child  . Allergic rhinitis Paternal Grandmother   . Allergic rhinitis Paternal Grandfather     Social History Social History   Tobacco Use  . Smoking status: Former Smoker    Types: Cigarettes    Quit date: 09/30/2015    Years since quitting: 4.2  . Smokeless tobacco: Never Used  Vaping Use  . Vaping Use: Never used  Substance Use Topics  . Alcohol use: No  . Drug use: No     Allergies   Augmentin [amoxicillin-pot clavulanate]   Review of Systems Review of Systems  Genitourinary: Positive for difficulty urinating, frequency and vaginal discharge. Negative for flank pain, hematuria, pelvic pain, vaginal bleeding and vaginal pain.  All other systems reviewed and are negative.    Physical Exam Triage Vital Signs ED Triage Vitals [01/09/20 1755]  Enc Vitals Group     BP 115/75     Pulse Rate 76     Resp 18     Temp 98.9 F (37.2 C)  Temp Source Oral     SpO2 96 %     Weight      Height      Head Circumference      Peak Flow      Pain Score 0     Pain Loc      Pain Edu?      Excl. in GC?    No data found.  Updated Vital Signs BP 115/75 (BP Location: Left Arm)   Pulse 76   Temp 98.9 F (37.2 C) (Oral)   Resp 18   LMP 12/30/2019   SpO2 96%   Visual Acuity Right Eye Distance:   Left Eye Distance:   Bilateral Distance:    Right Eye Near:   Left Eye Near:    Bilateral Near:     Physical Exam Constitutional:      General: She is not in acute distress. HENT:     Head: Normocephalic and atraumatic.  Eyes:     General: No scleral icterus.    Pupils: Pupils are equal, round, and reactive to light.  Cardiovascular:     Rate and Rhythm: Normal rate.  Pulmonary:     Effort: Pulmonary effort is normal.  Abdominal:     General: Bowel sounds are normal.      Palpations: Abdomen is soft.     Tenderness: There is no abdominal tenderness. There is no right CVA tenderness, left CVA tenderness or guarding.  Genitourinary:    Comments: Patient declined, self-swab performed Skin:    Coloration: Skin is not jaundiced or pale.  Neurological:     Mental Status: She is alert and oriented to person, place, and time.      UC Treatments / Results  Labs (all labs ordered are listed, but only abnormal results are displayed) Labs Reviewed  POCT URINALYSIS DIP (MANUAL ENTRY) - Abnormal; Notable for the following components:      Result Value   Bilirubin, UA small (*)    Ketones, POC UA trace (5) (*)    Spec Grav, UA >=1.030 (*)    Blood, UA trace-intact (*)    Protein Ur, POC =30 (*)    Leukocytes, UA Trace (*)    All other components within normal limits  URINE CULTURE  POCT URINE PREGNANCY  CERVICOVAGINAL ANCILLARY ONLY    EKG   Radiology No results found.  Procedures Procedures (including critical care time)  Medications Ordered in UC Medications - No data to display  Initial Impression / Assessment and Plan / UC Course  I have reviewed the triage vital signs and the nursing notes.  Pertinent labs & imaging results that were available during my care of the patient were reviewed by me and considered in my medical decision making (see chart for details).     Urine pregnancy negative, urine dipstick with abnormalities as above.  Culture pending.  Will treat for likely UTI with Macrobid given patient's allergies to Augmentin, and take Diflucan today as well as at end of antibiotic course as she likely has concurrent yeast vaginitis.  Return precautions discussed, pt verbalized understanding and is agreeable to plan. Final Clinical Impressions(s) / UC Diagnoses   Final diagnoses:  Acute cystitis with hematuria  Acute vaginitis     Discharge Instructions     Take antibiotic twice daily with food. Important to drink plenty of water  throughout the day. Return for worsening urinary symptoms, blood in urine, abdominal or back pain, fever.    ED Prescriptions  Medication Sig Dispense Auth. Provider   fluconazole (DIFLUCAN) 150 MG tablet Take 1 tablet (150 mg total) by mouth daily. May repeat in 72 hours if needed 2 tablet Hall-Potvin, [place], PA-C   nitrofurantoin, macrocrystal-monohydrate, (MACROBID) 100 MG capsule Take 1 capsule (100 mg total) by mouth 2 (two) times daily for 3 days. 6 capsule Hall-Potvin, [place], PA-C     PDMP not reviewed this encounter.   Hall-Potvin, [place], [place] 01/09/20 1830

## 2020-01-11 LAB — URINE CULTURE: Culture: NO GROWTH

## 2020-01-13 LAB — CERVICOVAGINAL ANCILLARY ONLY
Chlamydia: NEGATIVE
Comment: NEGATIVE
Comment: NEGATIVE
Comment: NORMAL
Neisseria Gonorrhea: NEGATIVE
Trichomonas: NEGATIVE

## 2020-02-11 ENCOUNTER — Ambulatory Visit
Admission: EM | Admit: 2020-02-11 | Discharge: 2020-02-11 | Disposition: A | Discharge status: Home / Self Care | Payer: Medicaid Other | Attending: Emergency Medicine | Admitting: Emergency Medicine

## 2020-02-11 DIAGNOSIS — J069 Acute upper respiratory infection, unspecified: Secondary | ICD-10-CM

## 2020-02-11 MED ORDER — PREDNISONE 50 MG PO TABS: 50.0000 mg | ORAL_TABLET | Freq: Every day | ORAL | 0 refills | Status: DC

## 2020-02-11 MED ORDER — CETIRIZINE HCL 10 MG PO TABS: 10.0000 mg | ORAL_TABLET | Freq: Every day | ORAL | 0 refills | Status: DC

## 2020-02-11 MED ORDER — FLUTICASONE PROPIONATE 50 MCG/ACT NA SUSP: 1.0000 | Freq: Every day | NASAL | 0 refills | Status: DC

## 2020-02-11 MED ORDER — BENZONATATE 100 MG PO CAPS: 100.0000 mg | ORAL_CAPSULE | Freq: Three times a day (TID) | ORAL | 0 refills | Status: DC

## 2020-02-11 NOTE — ED Triage Notes (Signed)
Pt c/o cough and nasal/chest congestion for over 2wks. States cough is worse at night.

## 2020-02-11 NOTE — ED Provider Notes (Signed)
[facility]    CSN: [id number] Arrival date & time: 02/11/20  0831      History   Chief Complaint Chief Complaint  Patient presents with  . Cough    HPI Sharon Waller is a 24 y.o. female  Sharon Waller is a 24 y.o. female here for evaluation of a cough.  The cough is non-productive, without wheezing, dyspnea or hemoptysis and is aggravated by cold air, dust and fumes. Onset of symptoms was 2 weeks ago, unchanged since that time.  Associated symptoms include nasal congestion. Patient does not have a history of asthma. Patient has not had recent travel. Patient does not have a history of smoking. Patient  has not had a previous chest x-ray. Patient has not had a PPD done.  Is covid vaccinated.  The following portions of the patient's history were reviewed and updated as appropriate: allergies, current medications, past family history, past medical history, past social history, past surgical history and problem list.     Past Medical History:  Diagnosis Date  . Abdominal pain affecting pregnancy, antepartum 09/26/2015   [redacted]w[redacted]d on 09/23/15   . Allergic rhinitis 07/11/2018  . Asthma   . Bacterial vaginosis   . Bronchitis   . Choroid plexus cyst of fetus 01/07/2016  . Mild intermittent asthma 07/11/2018  . SVD (spontaneous vaginal delivery) 05/04/2016  . UTI (lower urinary tract infection)     Patient Active Problem List   Diagnosis Date Noted  . Palpitations 07/17/2018  . SOB (shortness of breath) 07/17/2018  . Chest pain of uncertain etiology 07/17/2018  . Mild intermittent asthma 07/11/2018  . Allergic rhinitis 07/11/2018    Past Surgical History:  Procedure Laterality Date  . NO PAST SURGERIES      OB History    Gravida  2   Para  1   Term  1   Preterm  0   AB  0   Living  1     SAB  0   TAB  0   Ectopic  0   Multiple  0   Live Births  1            Home Medications    Prior to Admission medications   Medication Sig  Start Date End Date Taking? Authorizing Provider  benzonatate (TESSALON) 100 MG capsule Take 1 capsule (100 mg total) by mouth every 8 (eight) hours. 02/11/20   Hall-Potvin, [place], PA-C  cetirizine (ZYRTEC ALLERGY) 10 MG tablet Take 1 tablet (10 mg total) by mouth daily. 02/11/20   Hall-Potvin, [place], PA-C  fluticasone (FLONASE) 50 MCG/ACT nasal spray Place 1 spray into both nostrils daily. 02/11/20   Hall-Potvin, [place], PA-C  predniSONE (DELTASONE) 50 MG tablet Take 1 tablet (50 mg total) by mouth daily with breakfast. 02/11/20   Hall-Potvin, [place], PA-C    Family History Family History  Problem Relation Age of Onset  . Cancer Maternal Aunt   . Cancer Maternal Grandmother   . Breast cancer Maternal Grandmother   . Allergic rhinitis Father   . Asthma Brother        as child  . Allergic rhinitis Paternal Grandmother   . Allergic rhinitis Paternal Grandfather     Social History Social History   Tobacco Use  . Smoking status: Former Smoker    Types: Cigarettes    Quit date: 09/30/2015    Years since quitting: 4.3  . Smokeless tobacco: Never Used  Vaping Use  . Vaping Use: Never  used  Substance Use Topics  . Alcohol use: No  . Drug use: No     Allergies   Augmentin [amoxicillin-pot clavulanate]   Review of Systems Review of Systems  Constitutional: Negative for fatigue and fever.  HENT: Positive for congestion. Negative for dental problem, ear pain, facial swelling, hearing loss, sinus pain, sore throat, trouble swallowing and voice change.   Eyes: Negative for photophobia, pain and visual disturbance.  Respiratory: Positive for cough. Negative for chest tightness, shortness of breath and wheezing.   Cardiovascular: Negative for chest pain and palpitations.  Gastrointestinal: Negative for diarrhea and vomiting.  Musculoskeletal: Negative for arthralgias and myalgias.  Neurological: Negative for dizziness and headaches.     Physical Exam Triage Vital  Signs ED Triage Vitals  Enc Vitals Group     BP 02/11/20 0846 113/79     Pulse Rate 02/11/20 0846 83     Resp 02/11/20 0846 20     Temp 02/11/20 0846 98.6 F (37 C)     Temp Source 02/11/20 0846 Oral     SpO2 02/11/20 0846 98 %     Weight --      Height --      Head Circumference --      Peak Flow --      Pain Score 02/11/20 0911 0     Pain Loc --      Pain Edu? --      Excl. in GC? --    No data found.  Updated Vital Signs BP 113/79 (BP Location: Left Arm)   Pulse 83   Temp 98.6 F (37 C) (Oral)   Resp 20   LMP 01/18/2020   SpO2 98%   Visual Acuity Right Eye Distance:   Left Eye Distance:   Bilateral Distance:    Right Eye Near:   Left Eye Near:    Bilateral Near:     Physical Exam Constitutional:      General: She is not in acute distress.    Appearance: She is not ill-appearing or diaphoretic.  HENT:     Head: Normocephalic and atraumatic.     Right Ear: Tympanic membrane and ear canal normal.     Left Ear: Tympanic membrane and ear canal normal.     Mouth/Throat:     Mouth: Mucous membranes are moist.     Pharynx: Oropharynx is clear. No oropharyngeal exudate or posterior oropharyngeal erythema.  Eyes:     General: No scleral icterus.    Conjunctiva/sclera: Conjunctivae normal.     Pupils: Pupils are equal, round, and reactive to light.  Neck:     Comments: Trachea midline, negative JVD Cardiovascular:     Rate and Rhythm: Normal rate and regular rhythm.     Heart sounds: No murmur heard.  No gallop.   Pulmonary:     Effort: Pulmonary effort is normal. No respiratory distress.     Breath sounds: No wheezing, rhonchi or rales.  Musculoskeletal:     Cervical back: Neck supple. No tenderness.  Lymphadenopathy:     Cervical: No cervical adenopathy.  Skin:    Capillary Refill: Capillary refill takes less than 2 seconds.     Coloration: Skin is not jaundiced or pale.     Findings: No rash.  Neurological:     General: No focal deficit present.      Mental Status: She is alert and oriented to person, place, and time.      UC Treatments / Results  Labs (  all labs ordered are listed, but only abnormal results are displayed) Labs Reviewed - No data to display  EKG   Radiology No results found.  Procedures Procedures (including critical care time)  Medications Ordered in UC Medications - No data to display  Initial Impression / Assessment and Plan / UC Course  I have reviewed the triage vital signs and the nursing notes.  Pertinent labs & imaging results that were available during my care of the patient were reviewed by me and considered in my medical decision making (see chart for details).     Afebrile, nontoxic.  Nonproductive cough with reassuring cardiopulmonary exam and vitals.  Will treat supportively as below.  Return precautions discussed, pt verbalized understanding and is agreeable to plan. Final Clinical Impressions(s) / UC Diagnoses   Final diagnoses:  URI with cough and congestion     Discharge Instructions     Tessalon for cough. Start flonase, atrovent nasal spray for nasal congestion/drainage. You can use over the counter nasal saline rinse such as neti pot for nasal congestion. Keep hydrated, your urine should be clear to pale yellow in color. Tylenol/motrin for fever and pain. Monitor for any worsening of symptoms, chest pain, shortness of breath, wheezing, swelling of the throat, go to the emergency department for further evaluation needed.     ED Prescriptions    Medication Sig Dispense Auth. Provider   benzonatate (TESSALON) 100 MG capsule Take 1 capsule (100 mg total) by mouth every 8 (eight) hours. 21 capsule Hall-Potvin, [place], PA-C   fluticasone (FLONASE) 50 MCG/ACT nasal spray Place 1 spray into both nostrils daily. 16 g Hall-Potvin, [place], PA-C   cetirizine (ZYRTEC ALLERGY) 10 MG tablet Take 1 tablet (10 mg total) by mouth daily. 30 tablet Hall-Potvin, [place], PA-C   predniSONE  (DELTASONE) 50 MG tablet Take 1 tablet (50 mg total) by mouth daily with breakfast. 5 tablet Hall-Potvin, [place], PA-C     PDMP not reviewed this encounter.   Hall-Potvin, [place], [place] 02/11/20 1006

## 2020-02-11 NOTE — Discharge Instructions (Signed)

## 2020-02-14 ENCOUNTER — Ambulatory Visit
Admission: EM | Admit: 2020-02-14 | Discharge: 2020-02-14 | Disposition: A | Discharge status: Home / Self Care | Payer: Medicaid Other | Attending: Family Medicine | Admitting: Family Medicine

## 2020-02-14 DIAGNOSIS — J069 Acute upper respiratory infection, unspecified: Secondary | ICD-10-CM

## 2020-02-14 DIAGNOSIS — R059 Cough, unspecified: Secondary | ICD-10-CM | POA: Diagnosis not present

## 2020-02-14 MED ORDER — PROMETHAZINE-DM 6.25-15 MG/5ML PO SYRP: 5.0000 mL | ORAL_SOLUTION | Freq: Four times a day (QID) | ORAL | 0 refills | Status: DC | PRN

## 2020-02-14 MED ORDER — BENZONATATE 100 MG PO CAPS: 200.0000 mg | ORAL_CAPSULE | Freq: Three times a day (TID) | ORAL | 0 refills | Status: DC

## 2020-02-14 MED ORDER — ALBUTEROL SULFATE HFA 108 (90 BASE) MCG/ACT IN AERS
2.0000 | INHALATION_SPRAY | Freq: Once | RESPIRATORY_TRACT | Status: AC
  Administered 2020-02-14: 2 via RESPIRATORY_TRACT

## 2020-02-14 NOTE — Discharge Instructions (Addendum)
I have increased her benzonatate for you to take 200 mg every 8 hours as needed for cough and also prescribed you Promethazine DM which is a cough syrup he can take four times a day as needed for cough.  Your lungs sound completely clear therefore I do not suspect symptoms are related to any type of an infectious process.  I will start you on an albuterol inhaler given history of asthma and inflammation in the bronchials can cause a persistent cough.  Complete the prednisone that was prescribed to you as well along with continuing your allergy medication and Flonase. Although your cough can linger for a few weeks following a upper respiratory illness your cough should greatly improved with current treatment.  Continue to hydrate well with water.

## 2020-02-14 NOTE — ED Triage Notes (Signed)
Pt states has had a bad cough for 2wks. States seen here on Tuesday, feeling worse. States now having chest congestion and feels like a knot in her throat.

## 2020-02-14 NOTE — ED Provider Notes (Signed)
[facility]    CSN: [id number] Arrival date & time: 02/14/20  4098      History   Chief Complaint Chief Complaint  Patient presents with  . Cough    HPI [facility] is a 24 y.o. female.   HPI  Patient presents today complaining of worsening cough and throat irritation from persistently coughing.  Patient was seen here on November 30 and prescribed prednisone, Tessalon Perles, Flonase along with Claritin for management of symptoms.  Patient reports overall symptoms have been present for about 2 weeks.  All of her nasal symptoms have completely resolved with treatment however she continues to have a persistent cough.  She has not had any wheezing short of breath only with coughing.  Patient does have a distant history of asthma and she has been previously diagnosed with bronchitis.  She has no fever.  She is fully vaccinated.  No other flulike symptoms.  Past Medical History:  Diagnosis Date  . Abdominal pain affecting pregnancy, antepartum 09/26/2015   [redacted]w[redacted]d on 09/23/15   . Allergic rhinitis 07/11/2018  . Asthma   . Bacterial vaginosis   . Bronchitis   . Choroid plexus cyst of fetus 01/07/2016  . Mild intermittent asthma 07/11/2018  . SVD (spontaneous vaginal delivery) 05/04/2016  . UTI (lower urinary tract infection)     Patient Active Problem List   Diagnosis Date Noted  . Palpitations 07/17/2018  . SOB (shortness of breath) 07/17/2018  . Chest pain of uncertain etiology 07/17/2018  . Mild intermittent asthma 07/11/2018  . Allergic rhinitis 07/11/2018    Past Surgical History:  Procedure Laterality Date  . NO PAST SURGERIES      OB History    Gravida  2   Para  1   Term  1   Preterm  0   AB  0   Living  1     SAB  0   TAB  0   Ectopic  0   Multiple  0   Live Births  1            Home Medications    Prior to Admission medications   Medication Sig Start Date End Date Taking? Authorizing Provider  benzonatate (TESSALON)  100 MG capsule Take 1 capsule (100 mg total) by mouth every 8 (eight) hours. 02/11/20   Hall-Potvin, [place], PA-C  cetirizine (ZYRTEC ALLERGY) 10 MG tablet Take 1 tablet (10 mg total) by mouth daily. 02/11/20   Hall-Potvin, [place], PA-C  fluticasone (FLONASE) 50 MCG/ACT nasal spray Place 1 spray into both nostrils daily. 02/11/20   Hall-Potvin, [place], PA-C  predniSONE (DELTASONE) 50 MG tablet Take 1 tablet (50 mg total) by mouth daily with breakfast. 02/11/20   Hall-Potvin, [place], PA-C    Family History Family History  Problem Relation Age of Onset  . Cancer Maternal Aunt   . Cancer Maternal Grandmother   . Breast cancer Maternal Grandmother   . Allergic rhinitis Father   . Asthma Brother        as child  . Allergic rhinitis Paternal Grandmother   . Allergic rhinitis Paternal Grandfather     Social History Social History   Tobacco Use  . Smoking status: Former Smoker    Types: Cigarettes    Quit date: 09/30/2015    Years since quitting: 4.3  . Smokeless tobacco: Never Used  Vaping Use  . Vaping Use: Never used  Substance Use Topics  . Alcohol use: No  . Drug use:  No     Allergies   Augmentin [amoxicillin-pot clavulanate]   Review of Systems Review of Systems Pertinent negatives listed in HPI   Physical Exam Triage Vital Signs ED Triage Vitals  Enc Vitals Group     BP 02/14/20 0926 110/73     Pulse Rate 02/14/20 0926 64     Resp 02/14/20 0926 20     Temp 02/14/20 0926 98.1 F (36.7 C)     Temp Source 02/14/20 0926 Oral     SpO2 02/14/20 0926 96 %     Weight --      Height --      Head Circumference --      Peak Flow --      Pain Score 02/14/20 0957 10     Pain Loc --      Pain Edu? --      Excl. in GC? --    No data found.  Updated Vital Signs BP 110/73 (BP Location: Left Arm)   Pulse 64   Temp 98.1 F (36.7 C) (Oral)   Resp 20   LMP 01/18/2020   SpO2 96%   Visual Acuity Right Eye Distance:   Left Eye Distance:   Bilateral  Distance:    Right Eye Near:   Left Eye Near:    Bilateral Near:     Physical Exam Constitutional: Patient appears ill-appearing, cooperative, alert HENT: Normocephalic, atraumatic, External right and left ear normal. Oropharynx is clear and moist.  Eyes: Conjunctivae and EOM are normal. PERRLA, no scleral icterus. Neck: Normal ROM. Neck supple. No JVD. No tracheal deviation. No thyromegaly. CVS: RRR, S1/S2 +, no murmurs, no gallops, no carotid bruit.  Pulmonary: Effort and breath sounds normal, no stridor, rhonchi, wheezes, rales.  Musculoskeletal: Normal range of motion. No edema and no tenderness.  Neuro: Alert. Normal reflexes, muscle tone coordination. No cranial nerve deficit. Skin: Skin is warm and dry. No rash noted. Not diaphoretic. No erythema. No pallor. Psychiatric: Normal mood and affect. Behavior, judgment, thought content normal. UC Treatments / Results  Labs (all labs ordered are listed, but only abnormal results are displayed) Labs Reviewed - No data to display  EKG   Radiology No results found.  Procedures Procedures (including critical care time)  Medications Ordered in UC Medications - No data to display  Initial Impression / Assessment and Plan / UC Course  I have reviewed the triage vital signs and the nursing notes.  Pertinent labs & imaging results that were available during my care of the patient were reviewed by me and considered in my medical decision making (see chart for details).    Given exam findings antibiotics are not warranted feel that patient is on the tail end of a viral respiratory illness as her sinus symptoms have completely resolved she is still dealing with a persistent cough.  Advised to continue prednisone and I will add an albuterol inhaler along with promethazine DM for cough.  Increase benzonatate to 200 mg every 8 hours as needed for cough.  Encouraged to continue to drink water.  Advised that cough may linger for a few more  weeks however should significantly improve and should not be to the severity in which she is unable to talk or sleep due to cough. Final Clinical Impressions(s) / UC Diagnoses   Final diagnoses:  Cough  Acute upper respiratory infection     Discharge Instructions     I have increased her benzonatate for you to take 200 mg every  8 hours as needed for cough and also prescribed you Promethazine DM which is a cough syrup he can take four times a day as needed for cough.  Your lungs sound completely clear therefore I do not suspect symptoms are related to any type of an infectious process.  I will start you on an albuterol inhaler given history of asthma and inflammation in the bronchials can cause a persistent cough.  Complete the prednisone that was prescribed to you as well along with continuing your allergy medication and Flonase. Although your cough can linger for a few weeks following a upper respiratory illness your cough should greatly improved with current treatment.  Continue to hydrate well with water.    ED Prescriptions    Medication Sig Dispense Auth. Provider   benzonatate (TESSALON) 100 MG capsule Take 2 capsules (200 mg total) by mouth every 8 (eight) hours. 21 capsule Bing Neighbors, FNP   promethazine-dextromethorphan (PROMETHAZINE-DM) 6.25-15 MG/5ML syrup Take 5 mLs by mouth 4 (four) times daily as needed for cough. 140 mL Bing Neighbors, FNP     PDMP not reviewed this encounter.   Bing Neighbors, FNP 02/14/20 1032

## 2020-03-10 ENCOUNTER — Emergency Department (HOSPITAL_COMMUNITY)
Admission: EM | Admit: 2020-03-10 | Discharge: 2020-03-10 | Disposition: A | Discharge status: Home / Self Care | Payer: Medicaid Other | Attending: Emergency Medicine | Admitting: Emergency Medicine

## 2020-03-10 ENCOUNTER — Emergency Department (HOSPITAL_COMMUNITY): Payer: Medicaid Other

## 2020-03-10 ENCOUNTER — Encounter (HOSPITAL_COMMUNITY): Payer: Self-pay

## 2020-03-10 DIAGNOSIS — U071 COVID-19: Secondary | ICD-10-CM | POA: Insufficient documentation

## 2020-03-10 DIAGNOSIS — J4520 Mild intermittent asthma, uncomplicated: Secondary | ICD-10-CM | POA: Diagnosis not present

## 2020-03-10 DIAGNOSIS — Z87891 Personal history of nicotine dependence: Secondary | ICD-10-CM | POA: Insufficient documentation

## 2020-03-10 DIAGNOSIS — R6883 Chills (without fever): Secondary | ICD-10-CM | POA: Diagnosis present

## 2020-03-10 LAB — URINALYSIS, ROUTINE W REFLEX MICROSCOPIC
Bilirubin Urine: NEGATIVE
Glucose, UA: NEGATIVE mg/dL
Hgb urine dipstick: NEGATIVE
Ketones, ur: NEGATIVE mg/dL
Nitrite: NEGATIVE
Protein, ur: NEGATIVE mg/dL
Specific Gravity, Urine: 1.004 — ABNORMAL LOW (ref 1.005–1.030)
pH: 6 (ref 5.0–8.0)

## 2020-03-10 LAB — RESP PANEL BY RT-PCR (FLU A&B, COVID) ARPGX2
Influenza A by PCR: NEGATIVE
Influenza B by PCR: NEGATIVE
SARS Coronavirus 2 by RT PCR: POSITIVE — AB

## 2020-03-10 LAB — POC URINE PREG, ED: Preg Test, Ur: NEGATIVE

## 2020-03-10 MED ORDER — METHYLPREDNISOLONE 4 MG PO TBPK: ORAL_TABLET | ORAL | 0 refills | Status: DC

## 2020-03-10 MED ORDER — DOXYCYCLINE HYCLATE 100 MG PO CAPS: 100.0000 mg | ORAL_CAPSULE | Freq: Two times a day (BID) | ORAL | 0 refills | Status: DC

## 2020-03-10 MED ORDER — ACETAMINOPHEN 325 MG PO TABS
650.0000 mg | ORAL_TABLET | Freq: Once | ORAL | Status: AC | PRN
  Administered 2020-03-10: 650 mg via ORAL
  Filled 2020-03-10: qty 2

## 2020-03-10 NOTE — Discharge Instructions (Signed)
Person Under Monitoring Name: Sharon Waller  Location: [address] [place] [id number]   Infection Prevention Recommendations for Individuals Confirmed to have, or Being Evaluated for, 2019 Novel Coronavirus (COVID-19) Infection Who Receive Care at Home  Individuals who are confirmed to have, or are being evaluated for, COVID-19 should follow the prevention steps below until a healthcare provider or local or state health department says they can return to normal activities.  Stay home except to get medical care You should restrict activities outside your home, except for getting medical care. Do not go to work, school, or public areas, and do not use public transportation or taxis.  Call ahead before visiting your doctor Before your medical appointment, call the healthcare provider and tell them that you have, or are being evaluated for, COVID-19 infection. This will help the healthcare providers office take steps to keep other people from getting infected. Ask your healthcare provider to call the local or state health department.  Monitor your symptoms Seek prompt medical attention if your illness is worsening (e.g., difficulty breathing). Before going to your medical appointment, call the healthcare provider and tell them that you have, or are being evaluated for, COVID-19 infection. Ask your healthcare provider to call the local or state health department.  Wear a facemask You should wear a facemask that covers your nose and mouth when you are in the same room with other people and when you visit a healthcare provider. People who live with or visit you should also wear a facemask while they are in the same room with you.  Separate yourself from other people in your home As much as possible, you should stay in a different room from other people in your home. Also, you should use a separate bathroom, if available.  Avoid sharing household items You  should not share dishes, drinking glasses, cups, eating utensils, towels, bedding, or other items with other people in your home. After using these items, you should wash them thoroughly with soap and water.  Cover your coughs and sneezes Cover your mouth and nose with a tissue when you cough or sneeze, or you can cough or sneeze into your sleeve. Throw used tissues in a lined trash can, and immediately wash your hands with soap and water for at least 20 seconds or use an alcohol-based hand rub.  Wash your [employer] your hands often and thoroughly with soap and water for at least 20 seconds. You can use an alcohol-based hand sanitizer if soap and water are not available and if your hands are not visibly dirty. Avoid touching your eyes, nose, and mouth with unwashed hands.   Prevention Steps for Caregivers and Household Members of Individuals Confirmed to have, or Being Evaluated for, COVID-19 Infection Being Cared for in the Home  If you live with, or provide care at home for, a person confirmed to have, or being evaluated for, COVID-19 infection please follow these guidelines to prevent infection:  Follow healthcare providers instructions Make sure that you understand and can help the patient follow any healthcare provider instructions for all care.  Provide for the patients basic needs You should help the patient with basic needs in the home and provide support for getting groceries, prescriptions, and other personal needs.  Monitor the patients symptoms If they are getting sicker, call his or her medical provider and tell them that the patient has, or is being evaluated for, COVID-19 infection. This will help the healthcare  providers office take steps to keep other people from getting infected. Ask the healthcare provider to call the local or state health department.  Limit the number of people who have contact with the patient If possible, have only one caregiver for the  patient. Other household members should stay in another home or place of residence. If this is not possible, they should stay in another room, or be separated from the patient as much as possible. Use a separate bathroom, if available. Restrict visitors who do not have an essential need to be in the home.  Keep older adults, very young children, and other sick people away from the patient Keep older adults, very young children, and those who have compromised immune systems or chronic health conditions away from the patient. This includes people with chronic heart, lung, or kidney conditions, diabetes, and cancer.  Ensure good ventilation Make sure that shared spaces in the home have good air flow, such as from an air conditioner or an opened window, weather permitting.  Wash your hands often Wash your hands often and thoroughly with soap and water for at least 20 seconds. You can use an alcohol based hand sanitizer if soap and water are not available and if your hands are not visibly dirty. Avoid touching your eyes, nose, and mouth with unwashed hands. Use disposable paper towels to dry your hands. If not available, use dedicated cloth towels and replace them when they become wet.  Wear a facemask and gloves Wear a disposable facemask at all times in the room and gloves when you touch or have contact with the patients blood, body fluids, and/or secretions or excretions, such as sweat, saliva, sputum, nasal mucus, vomit, urine, or feces.  Ensure the mask fits over your nose and mouth tightly, and do not touch it during use. Throw out disposable facemasks and gloves after using them. Do not reuse. Wash your hands immediately after removing your facemask and gloves. If your personal clothing becomes contaminated, carefully remove clothing and launder. Wash your hands after handling contaminated clothing. Place all used disposable facemasks, gloves, and other waste in a lined container before  disposing them with other household waste. Remove gloves and wash your hands immediately after handling these items.  Do not share dishes, glasses, or other household items with the patient Avoid sharing household items. You should not share dishes, drinking glasses, cups, eating utensils, towels, bedding, or other items with a patient who is confirmed to have, or being evaluated for, COVID-19 infection. After the person uses these items, you should wash them thoroughly with soap and water.  Wash laundry thoroughly Immediately remove and wash clothes or bedding that have blood, body fluids, and/or secretions or excretions, such as sweat, saliva, sputum, nasal mucus, vomit, urine, or feces, on them. Wear gloves when handling laundry from the patient. Read and follow directions on labels of laundry or clothing items and detergent. In general, wash and dry with the warmest temperatures recommended on the label.  Clean all areas the individual has used often Clean all touchable surfaces, such as counters, tabletops, doorknobs, bathroom fixtures, toilets, phones, keyboards, tablets, and bedside tables, every day. Also, clean any surfaces that may have blood, body fluids, and/or secretions or excretions on them. Wear gloves when cleaning surfaces the patient has come in contact with. Use a diluted bleach solution (e.g., dilute bleach with 1 part bleach and 10 parts water) or a household disinfectant with a label that says EPA-registered for coronaviruses. To make  a bleach solution at home, add 1 tablespoon of bleach to 1 quart (4 cups) of water. For a larger supply, add  cup of bleach to 1 gallon (16 cups) of water. Read labels of cleaning products and follow recommendations provided on product labels. Labels contain instructions for safe and effective use of the cleaning product including precautions you should take when applying the product, such as wearing gloves or eye protection and making sure you  have good ventilation during use of the product. Remove gloves and wash hands immediately after cleaning.  Monitor yourself for signs and symptoms of illness Caregivers and household members are considered close contacts, should monitor their health, and will be asked to limit movement outside of the home to the extent possible. Follow the monitoring steps for close contacts listed on the symptom monitoring form.   ? If you have additional questions, contact your local health department or call the epidemiologist on call at [phone] (available 24/7). ? This guidance is subject to change. For the most up-to-date guidance from [facility], please refer to their website: [email or web address]

## 2020-03-10 NOTE — ED Provider Notes (Signed)
[facility]-EMERGENCY DEPT Provider Note   CSN: [id number] Arrival date & time: 03/10/20  0540     History Chief Complaint  Patient presents with  . Cough    Sharon Waller is a 24 y.o. female who presents emergency department with chief complaint of body aches chills and cough.  She had onset of a cough on Thanksgiving.  She has had a persistent cough since that time and has been seen twice.  She has been on Tessalon and cough syrup.  She seemed to be doing some better however she started having shaking chills, worsening in her cough symptoms and fever beginning 2 days ago.  She did go to Christmas with her family.  She is fully vaccinated against the coronavirus.  She has had some low back pain and increased urinary frequency.  HPI     Past Medical History:  Diagnosis Date  . Abdominal pain affecting pregnancy, antepartum 09/26/2015   [redacted]w[redacted]d on 09/23/15   . Allergic rhinitis 07/11/2018  . Asthma   . Bacterial vaginosis   . Bronchitis   . Choroid plexus cyst of fetus 01/07/2016  . Mild intermittent asthma 07/11/2018  . SVD (spontaneous vaginal delivery) 05/04/2016  . UTI (lower urinary tract infection)     Patient Active Problem List   Diagnosis Date Noted  . Palpitations 07/17/2018  . SOB (shortness of breath) 07/17/2018  . Chest pain of uncertain etiology 07/17/2018  . Mild intermittent asthma 07/11/2018  . Allergic rhinitis 07/11/2018    Past Surgical History:  Procedure Laterality Date  . NO PAST SURGERIES       OB History    Gravida  2   Para  1   Term  1   Preterm  0   AB  0   Living  1     SAB  0   IAB  0   Ectopic  0   Multiple  0   Live Births  1           Family History  Problem Relation Age of Onset  . Cancer Maternal Aunt   . Cancer Maternal Grandmother   . Breast cancer Maternal Grandmother   . Allergic rhinitis Father   . Asthma Brother        as child  . Allergic rhinitis Paternal Grandmother   .  Allergic rhinitis Paternal Grandfather     Social History   Tobacco Use  . Smoking status: Former Smoker    Types: Cigarettes    Quit date: 09/30/2015    Years since quitting: 4.4  . Smokeless tobacco: Never Used  Vaping Use  . Vaping Use: Never used  Substance Use Topics  . Alcohol use: No  . Drug use: No    Home Medications Prior to Admission medications   Medication Sig Start Date End Date Taking? Authorizing Provider  benzonatate (TESSALON) 100 MG capsule Take 2 capsules (200 mg total) by mouth every 8 (eight) hours. 02/14/20   Bing Neighbors, FNP  cetirizine (ZYRTEC ALLERGY) 10 MG tablet Take 1 tablet (10 mg total) by mouth daily. 02/11/20   Hall-Potvin, [place], PA-C  fluticasone (FLONASE) 50 MCG/ACT nasal spray Place 1 spray into both nostrils daily. 02/11/20   Hall-Potvin, [place], PA-C  predniSONE (DELTASONE) 50 MG tablet Take 1 tablet (50 mg total) by mouth daily with breakfast. 02/11/20   Hall-Potvin, [place], PA-C  promethazine-dextromethorphan (PROMETHAZINE-DM) 6.25-15 MG/5ML syrup Take 5 mLs by mouth 4 (four) times daily as needed  for cough. 02/14/20   Bing Neighbors, FNP    Allergies    Augmentin [amoxicillin-pot clavulanate]  Review of Systems   Review of Systems Ten systems reviewed and are negative for acute change, except as noted in the HPI.   Physical Exam Updated Vital Signs BP 117/74 (BP Location: Right Arm)   Pulse 99   Temp (!) 100.9 F (38.3 C) (Oral)   Resp 15   Ht 5\' 6"  (1.676 m)   Wt 62.1 kg   LMP 02/14/2020   SpO2 100%   BMI 22.11 kg/m   Physical Exam Vitals and nursing note reviewed.  Constitutional:      General: She is not in acute distress.    Appearance: She is well-developed and well-nourished. She is ill-appearing. She is not toxic-appearing or diaphoretic.  HENT:     Head: Normocephalic and atraumatic.  Eyes:     General: No scleral icterus.    Conjunctiva/sclera: Conjunctivae normal.  Cardiovascular:     Rate  and Rhythm: Normal rate and regular rhythm.     Heart sounds: Normal heart sounds. No murmur heard. No friction rub. No gallop.   Pulmonary:     Effort: Pulmonary effort is normal. No respiratory distress.     Breath sounds: Normal breath sounds.  Abdominal:     General: Bowel sounds are normal. There is no distension.     Palpations: Abdomen is soft. There is no mass.     Tenderness: There is no abdominal tenderness. There is no guarding.  Musculoskeletal:     Cervical back: Normal range of motion.  Skin:    General: Skin is warm and dry.  Neurological:     Mental Status: She is alert and oriented to person, place, and time.  Psychiatric:        Behavior: Behavior normal.     ED Results / Procedures / Treatments   Labs (all labs ordered are listed, but only abnormal results are displayed) Labs Reviewed  RESP PANEL BY RT-PCR (FLU A&B, COVID) ARPGX2 - Abnormal; Notable for the following components:      Result Value   SARS Coronavirus 2 by RT PCR POSITIVE (*)    All other components within normal limits  URINALYSIS, ROUTINE W REFLEX MICROSCOPIC  POC URINE PREG, ED    EKG None  Radiology DG Chest 1 View  Result Date: 03/10/2020 CLINICAL DATA:  Cough and fever EXAM: CHEST  1 VIEW COMPARISON:  June 27, 2018 FINDINGS: Lungs are clear. Heart size and pulmonary vascularity are normal. No adenopathy. There is lower thoracic dextroscoliosis, stable. IMPRESSION: Lungs clear.  Cardiac silhouette normal. Electronically Signed   By: June 29, 2018 III M.D.   On: 03/10/2020 09:37    Procedures Procedures (including critical care time)  Medications Ordered in ED Medications  acetaminophen (TYLENOL) tablet 650 mg (650 mg Oral Given 03/10/20 0719)    ED Course  I have reviewed the triage vital signs and the nursing notes.  Pertinent labs & imaging results that were available during my care of the patient were reviewed by me and considered in my medical decision making (see  chart for details).    MDM Rules/Calculators/A&P                         Patient here with complaint of URI symptoms persistent for 1 month now worsening with chills and fever.  She is Covid positive.  I ordered and reviewed urinalysis and pregnancy  test which are both negative.  Although her symptoms are likely from her COVID-19 infection she has had persistent cough with now worsening symptoms which could be consistent with a double sickening and will cover with doxycycline I did review the patient's chest x-ray which shows no obvious evidence of pneumonia.  Patient will be given a Medrol Dosepak for her cough.  Patient appears otherwise appropriate for discharge at this time with home isolation precautions.   Sharon Waller was evaluated in Emergency Department on 03/10/2020 for the symptoms described in the history of present illness. She was evaluated in the context of the global COVID-19 pandemic, which necessitated consideration that the patient might be at risk for infection with the SARS-CoV-2 virus that causes COVID-19. Institutional protocols and algorithms that pertain to the evaluation of patients at risk for COVID-19 are in a state of rapid change based on information released by regulatory bodies including the CDC and federal and state organizations. These policies and algorithms were followed during the patient's care in the ED.  Final Clinical Impression(s) / ED Diagnoses Final diagnoses:  None    Rx / DC Orders ED Discharge Orders    None       Arthor Captain, PA-C 03/10/20 1104    Pricilla Loveless, MD 03/11/20 [phone]

## 2020-03-10 NOTE — ED Triage Notes (Signed)
Pt presents with c/o cough. Pt reports she has had a cough since Thanksgiving. Pt reports she was then diagnosed with bronchitis. Pt reports she now has body aches and chills.

## 2020-04-22 ENCOUNTER — Encounter: Payer: Self-pay | Admitting: Nurse Practitioner

## 2020-04-22 ENCOUNTER — Other Ambulatory Visit: Payer: Self-pay

## 2020-04-22 ENCOUNTER — Ambulatory Visit: Payer: Medicaid Other | Attending: Nurse Practitioner | Admitting: Nurse Practitioner

## 2020-04-22 VITALS — BP 91/71 | HR 71 | Temp 98.5°F | Ht 66.0 in | Wt 138.0 lb

## 2020-04-22 DIAGNOSIS — Z8616 Personal history of COVID-19: Secondary | ICD-10-CM | POA: Insufficient documentation

## 2020-04-22 DIAGNOSIS — L700 Acne vulgaris: Secondary | ICD-10-CM | POA: Insufficient documentation

## 2020-04-22 DIAGNOSIS — R5383 Other fatigue: Secondary | ICD-10-CM | POA: Diagnosis not present

## 2020-04-22 DIAGNOSIS — L309 Dermatitis, unspecified: Secondary | ICD-10-CM | POA: Diagnosis not present

## 2020-04-22 MED ORDER — TRIAMCINOLONE ACETONIDE 0.1 % EX CREA: 1.0000 "application " | TOPICAL_CREAM | Freq: Two times a day (BID) | CUTANEOUS | 1 refills | Status: DC

## 2020-04-22 MED ORDER — ADAPALENE 0.1 % EX CREA: TOPICAL_CREAM | Freq: Every day | CUTANEOUS | 1 refills | Status: DC

## 2020-04-22 NOTE — Progress Notes (Signed)
Assessment & Plan:  Sharon Waller was seen today for eczema.  Diagnoses and all orders for this visit:  Fatigue, unspecified type -     CBC -     Basic metabolic panel  Acne vulgaris -     adapalene (DIFFERIN) 0.1 % cream; Apply topically at bedtime. For acne  Eczema, unspecified type -     triamcinolone (KENALOG) 0.1 %; Apply 1 application topically 2 (two) times daily. For eczema    Patient has been counseled on age-appropriate routine health concerns for screening and prevention. These are reviewed and up-to-date. Referrals have been placed accordingly. Immunizations are up-to-date or declined.    Subjective:   Chief Complaint  Patient presents with  . Eczema    Patient stated she have eczema on her back and been feeling tired.    HPI Sharon Waller 25 y.o. female presents to office today for with complaints of itching skin specifically her back. She does have what appears to be eczema which she has been treated for with steroid cream in the past. She also has a moderate case of acne vulgaris on her back and along the hairline. She states she has dealt with acne since her high school years. Has never seen a dermatologist for this.  Eczema: Onset of symptoms several years ago, and have been unchanged since that time.  Risk factors include allergic rhinitis history. Treatment modalities that have been used in the past include: triamcinolone cream.   Fatigue Patient complains of fatigue. Symptoms began a few months ago after being diagnosed with COVID. Sentinal symptom the patient feels fatigue began with: none. Symptoms of her fatigue have been general malaise and headaches. Patient describes the following psychologic symptoms: none.  Patient denies fever and significant change in weight. Symptoms have progressed to a point and plateaued. Severity has been symptoms bothersome, but easily able to carry out all usual work/school/family activities. Previous visits for this problem: none.     Review of Systems  Constitutional: Positive for malaise/fatigue. Negative for fever and weight loss.  HENT: Positive for sinus pain. Negative for nosebleeds.   Eyes: Negative.  Negative for blurred vision, double vision and photophobia.  Respiratory: Negative.  Negative for cough and shortness of breath.   Cardiovascular: Negative.  Negative for chest pain, palpitations and leg swelling.  Gastrointestinal: Negative.  Negative for heartburn, nausea and vomiting.  Musculoskeletal: Negative.  Negative for myalgias.  Skin: Positive for rash.  Neurological: Positive for headaches. Negative for dizziness, focal weakness and seizures.  Psychiatric/Behavioral: Negative.  Negative for suicidal ideas.    Past Medical History:  Diagnosis Date  . Abdominal pain affecting pregnancy, antepartum 09/26/2015   102w6d on 09/23/15   . Allergic rhinitis 07/11/2018  . Asthma   . Bacterial vaginosis   . Bronchitis   . Choroid plexus cyst of fetus 01/07/2016  . Mild intermittent asthma 07/11/2018  . SVD (spontaneous vaginal delivery) 05/04/2016  . UTI (lower urinary tract infection)     Past Surgical History:  Procedure Laterality Date  . NO PAST SURGERIES      Family History  Problem Relation Age of Onset  . Cancer Maternal Aunt   . Cancer Maternal Grandmother   . Breast cancer Maternal Grandmother   . Allergic rhinitis Father   . Asthma Brother        as child  . Allergic rhinitis Paternal Grandmother   . Allergic rhinitis Paternal Grandfather     Social History Reviewed with no changes to  be made today.   Outpatient Medications Prior to Visit  Medication Sig Dispense Refill  . benzonatate (TESSALON) 100 MG capsule Take 2 capsules (200 mg total) by mouth every 8 (eight) hours. (Patient not taking: Reported on 04/22/2020) 21 capsule 0  . cetirizine (ZYRTEC ALLERGY) 10 MG tablet Take 1 tablet (10 mg total) by mouth daily. (Patient not taking: No sig reported) 30 tablet 0  . doxycycline  (VIBRAMYCIN) 100 MG capsule Take 1 capsule (100 mg total) by mouth 2 (two) times daily. One po bid x 7 days (Patient not taking: Reported on 04/22/2020) 14 capsule 0  . fluticasone (FLONASE) 50 MCG/ACT nasal spray Place 1 spray into both nostrils daily. (Patient not taking: Reported on 04/22/2020) 16 g 0  . methylPREDNISolone (MEDROL DOSEPAK) 4 MG TBPK tablet Use as directed (Patient not taking: No sig reported) 21 tablet 0  . predniSONE (DELTASONE) 50 MG tablet Take 1 tablet (50 mg total) by mouth daily with breakfast. (Patient not taking: Reported on 04/22/2020) 5 tablet 0  . promethazine-dextromethorphan (PROMETHAZINE-DM) 6.25-15 MG/5ML syrup Take 5 mLs by mouth 4 (four) times daily as needed for cough. (Patient not taking: Reported on 04/22/2020) 140 mL 0   No facility-administered medications prior to visit.    Allergies  Allergen Reactions  . Augmentin [Amoxicillin-Pot Clavulanate] Rash    Has patient had a PCN reaction causing immediate rash, facial/tongue/throat swelling, SOB or lightheadedness with hypotension: Yes Has patient had a PCN reaction causing severe rash involving mucus membranes or skin necrosis: No Has patient had a PCN reaction that required hospitalization Yes Has patient had a PCN reaction occurring within the last 10 years: No If all of the above answers are "NO", then may proceed with Cephalosporin use.        Objective:    BP 91/71 (BP Location: Right Arm, Patient Position: Sitting, Cuff Size: Normal)   Pulse 71   Temp 98.5 F (36.9 C) (Oral)   Ht 5\' 6"  (1.676 m)   Wt 138 lb (62.6 kg)   LMP 04/19/2020   SpO2 96%   BMI 22.27 kg/m  Wt Readings from Last 3 Encounters:  04/22/20 138 lb (62.6 kg)  03/10/20 137 lb (62.1 kg)  12/13/19 135 lb (61.2 kg)    Physical Exam Vitals and nursing note reviewed.  Constitutional:      Appearance: She is well-developed and well-nourished.  HENT:     Head: Normocephalic and atraumatic.     Nose:     Right Turbinates:  Enlarged and swollen.     Left Turbinates: Enlarged and swollen.     Comments: She has flonase at home. I have recommended she use daily for the next few weeks.  Eyes:     Extraocular Movements: EOM normal.  Cardiovascular:     Rate and Rhythm: Normal rate and regular rhythm.     Pulses: Intact distal pulses.     Heart sounds: Normal heart sounds. No murmur heard. No friction rub. No gallop.   Pulmonary:     Effort: Pulmonary effort is normal. No tachypnea or respiratory distress.     Breath sounds: Normal breath sounds. No decreased breath sounds, wheezing, rhonchi or rales.  Chest:     Chest wall: No tenderness.  Abdominal:     General: Bowel sounds are normal.     Palpations: Abdomen is soft.  Musculoskeletal:        General: No edema. Normal range of motion.     Cervical back: Normal range  of motion.  Skin:    General: Skin is warm and dry.     Findings: Acne and rash present. Rash is macular.          Comments: Areas marked in RED: Eczema  Areas marked in Blue Acne vulgaris  Neurological:     Mental Status: She is alert and oriented to person, place, and time.     Coordination: Coordination normal.  Psychiatric:        Mood and Affect: Mood and affect normal.        Behavior: Behavior normal. Behavior is cooperative.        Thought Content: Thought content normal.        Judgment: Judgment normal.          Patient has been counseled extensively about nutrition and exercise as well as the importance of adherence with medications and regular follow-up. The patient was given clear instructions to go to ER or return to medical center if symptoms don't improve, worsen or new problems develop. The patient verbalized understanding.   Follow-up: Return for Reschedule pap double book 1110 slot in 2 weeks.   Claiborne Rigg, FNP-BC [facility] and [facility] [place], [place] [phone]   04/22/2020, 11:51 AM

## 2020-04-23 ENCOUNTER — Telehealth: Payer: Self-pay | Admitting: Nurse Practitioner

## 2020-04-23 LAB — BASIC METABOLIC PANEL
BUN/Creatinine Ratio: 14 (ref 9–23)
BUN: 9 mg/dL (ref 6–20)
CO2: 22 mmol/L (ref 20–29)
Calcium: 9.4 mg/dL (ref 8.7–10.2)
Chloride: 100 mmol/L (ref 96–106)
Creatinine, Ser: 0.64 mg/dL (ref 0.57–1.00)
GFR calc Af Amer: 142 mL/min/{1.73_m2} (ref >59)
GFR calc non Af Amer: 124 mL/min/{1.73_m2} (ref >59)
Glucose: 91 mg/dL (ref 65–99)
Potassium: 4.1 mmol/L (ref 3.5–5.2)
Sodium: 139 mmol/L (ref 134–144)

## 2020-04-23 LAB — CBC
Hematocrit: 38.3 % (ref 34.0–46.6)
Hemoglobin: 12.8 g/dL (ref 11.1–15.9)
MCH: 30.3 pg (ref 26.6–33.0)
MCHC: 33.4 g/dL (ref 31.5–35.7)
MCV: 91 fL (ref 79–97)
Platelets: 326 10*3/uL (ref 150–450)
RBC: 4.23 x10E6/uL (ref 3.77–5.28)
RDW: 13.1 % (ref 11.7–15.4)
WBC: 4.2 10*3/uL (ref 3.4–10.8)

## 2020-04-23 NOTE — Telephone Encounter (Signed)
Pt received pfizer vaccines 1st dose on 10-24-2019, 2nd dose on 11-21-2019. Pt does not know if she will get booster

## 2020-05-05 ENCOUNTER — Other Ambulatory Visit (HOSPITAL_COMMUNITY)
Admission: RE | Admit: 2020-05-05 | Discharge: 2020-05-05 | Disposition: A | Discharge status: Home / Self Care | Payer: Medicaid Other | Source: Ambulatory Visit | Attending: Nurse Practitioner | Admitting: Nurse Practitioner

## 2020-05-05 ENCOUNTER — Ambulatory Visit: Payer: Medicaid Other | Attending: Nurse Practitioner | Admitting: Nurse Practitioner

## 2020-05-05 ENCOUNTER — Other Ambulatory Visit: Payer: Self-pay

## 2020-05-05 ENCOUNTER — Encounter: Payer: Self-pay | Admitting: Nurse Practitioner

## 2020-05-05 VITALS — BP 99/67 | HR 76 | Temp 97.8°F | Ht 66.0 in | Wt 136.0 lb

## 2020-05-05 DIAGNOSIS — Z124 Encounter for screening for malignant neoplasm of cervix: Secondary | ICD-10-CM | POA: Diagnosis not present

## 2020-05-05 DIAGNOSIS — Z114 Encounter for screening for human immunodeficiency virus [HIV]: Secondary | ICD-10-CM | POA: Diagnosis not present

## 2020-05-05 NOTE — Progress Notes (Signed)
Assessment & Plan:  Sharon Waller was seen today for gynecologic exam.  Diagnoses and all orders for this visit:  Encounter for Papanicolaou smear for cervical cancer screening -     Cytology - PAP -     Cervicovaginal ancillary only  Encounter for screening for HIV -     HIV antibody (with reflex)    Patient has been counseled on age-appropriate routine health concerns for screening and prevention. These are reviewed and up-to-date. Referrals have been placed accordingly. Immunizations are up-to-date or declined.    Subjective:   Chief Complaint  Patient presents with  . Gynecologic Exam    Patient is here for a pap smear.    HPI Sharon Waller 25 y.o. female presents to office today for pap smear. She has no questions or concerns today aside from wanting to know if she should be screened for breast cancer due to strong family history (maternal grandmother and aunts).   Review of Systems  Constitutional: Negative.  Negative for chills, fever, malaise/fatigue and weight loss.  Respiratory: Negative.  Negative for cough, shortness of breath and wheezing.   Cardiovascular: Negative.  Negative for chest pain, orthopnea and leg swelling.  Gastrointestinal: Negative for abdominal pain.  Genitourinary: Negative.  Negative for flank pain.  Skin: Negative.  Negative for rash.  Psychiatric/Behavioral: Negative for suicidal ideas.    Past Medical History:  Diagnosis Date  . Abdominal pain affecting pregnancy, antepartum 09/26/2015   101w6d on 09/23/15   . Allergic rhinitis 07/11/2018  . Asthma   . Bacterial vaginosis   . Bronchitis   . Choroid plexus cyst of fetus 01/07/2016  . Mild intermittent asthma 07/11/2018  . SVD (spontaneous vaginal delivery) 05/04/2016  . UTI (lower urinary tract infection)     Past Surgical History:  Procedure Laterality Date  . NO PAST SURGERIES      Family History  Problem Relation Age of Onset  . Cancer Maternal Aunt   . Cancer Maternal Grandmother    . Breast cancer Maternal Grandmother   . Allergic rhinitis Father   . Asthma Brother        as child  . Allergic rhinitis Paternal Grandmother   . Allergic rhinitis Paternal Grandfather     Social History Reviewed with no changes to be made today.   Outpatient Medications Prior to Visit  Medication Sig Dispense Refill  . adapalene (DIFFERIN) 0.1 % cream Apply topically at bedtime. For acne 90 g 1  . triamcinolone (KENALOG) 0.1 % Apply 1 application topically 2 (two) times daily. For eczema 60 g 1   No facility-administered medications prior to visit.    Allergies  Allergen Reactions  . Augmentin [Amoxicillin-Pot Clavulanate] Rash    Has patient had a PCN reaction causing immediate rash, facial/tongue/throat swelling, SOB or lightheadedness with hypotension: Yes Has patient had a PCN reaction causing severe rash involving mucus membranes or skin necrosis: No Has patient had a PCN reaction that required hospitalization Yes Has patient had a PCN reaction occurring within the last 10 years: No If all of the above answers are "NO", then may proceed with Cephalosporin use.        Objective:    BP 99/67 (BP Location: Left Arm, Patient Position: Sitting, Cuff Size: Normal)   Pulse 76   Temp 97.8 F (36.6 C) (Oral)   Ht 5\' 6"  (1.676 m)   Wt 136 lb (61.7 kg)   LMP 04/19/2020   SpO2 98%   BMI 21.95 kg/m  Wt Readings from Last 3 Encounters:  05/05/20 136 lb (61.7 kg)  04/22/20 138 lb (62.6 kg)  03/10/20 137 lb (62.1 kg)    Physical Exam Exam conducted with a chaperone present.  Constitutional:      Appearance: She is well-developed and well-nourished.  HENT:     Head: Normocephalic.  Cardiovascular:     Rate and Rhythm: Normal rate and regular rhythm.     Heart sounds: Normal heart sounds.  Pulmonary:     Effort: Pulmonary effort is normal.     Breath sounds: Normal breath sounds.  Abdominal:     General: Bowel sounds are normal.     Palpations: Abdomen is soft.      Hernia: There is no hernia in the right inguinal area or left inguinal area.  Genitourinary:    Exam position: Lithotomy position.     Labia: No labial fusion.        Right: No rash, tenderness, lesion or injury.        Left: No rash, tenderness, lesion or injury.      Vagina: Normal. No signs of injury and foreign body. No vaginal discharge, erythema, tenderness or bleeding.     Cervix: No cervical motion tenderness or friability.     Uterus: Normal. Not deviated and not enlarged.      Adnexa:        Right: No mass, tenderness or fullness.         Left: No mass, tenderness or fullness.       Rectum: Normal. No external hemorrhoid.  Lymphadenopathy:     Lower Body: No right inguinal and no right inguinal adenopathy. No left inguinal and no left inguinal adenopathy.  Skin:    General: Skin is warm and dry.  Neurological:     Mental Status: She is alert and oriented to person, place, and time.  Psychiatric:        Mood and Affect: Mood and affect normal.        Behavior: Behavior normal.        Thought Content: Thought content normal.        Judgment: Judgment normal.          Patient has been counseled extensively about nutrition and exercise as well as the importance of adherence with medications and regular follow-up. The patient was given clear instructions to go to ER or return to medical center if symptoms don't improve, worsen or new problems develop. The patient verbalized understanding.   Follow-up: Return if symptoms worsen or fail to improve.   Claiborne Rigg, FNP-BC [facility] [place], [place] [phone]   05/05/2020, 11:29 AM

## 2020-05-05 NOTE — Addendum Note (Signed)
Addended byVidal Schwalbe on: 05/05/2020 11:34 AM   Modules accepted: Orders

## 2020-05-06 LAB — CERVICOVAGINAL ANCILLARY ONLY
Bacterial Vaginitis (gardnerella): POSITIVE — AB
Candida Glabrata: NEGATIVE
Candida Vaginitis: NEGATIVE
Chlamydia: NEGATIVE
Comment: NEGATIVE
Comment: NEGATIVE
Comment: NEGATIVE
Comment: NEGATIVE
Comment: NEGATIVE
Comment: NORMAL
Neisseria Gonorrhea: NEGATIVE
Trichomonas: NEGATIVE

## 2020-05-07 LAB — CYTOLOGY - PAP
Adequacy: ABSENT
Comment: NEGATIVE
Diagnosis: NEGATIVE
High risk HPV: NEGATIVE

## 2020-05-10 ENCOUNTER — Other Ambulatory Visit: Payer: Self-pay | Admitting: Nurse Practitioner

## 2020-05-10 MED ORDER — METRONIDAZOLE 500 MG PO TABS: 500.0000 mg | ORAL_TABLET | Freq: Two times a day (BID) | ORAL | 0 refills | Status: AC

## 2020-06-08 IMAGING — CR CHEST - 2 VIEW
2 series · 2 of 2 positions shown · non-contrast
Comparison: 04/12/2015

CLINICAL DATA: 23-year-old female

EXAM:
CHEST - 2 VIEW

[chest pa]
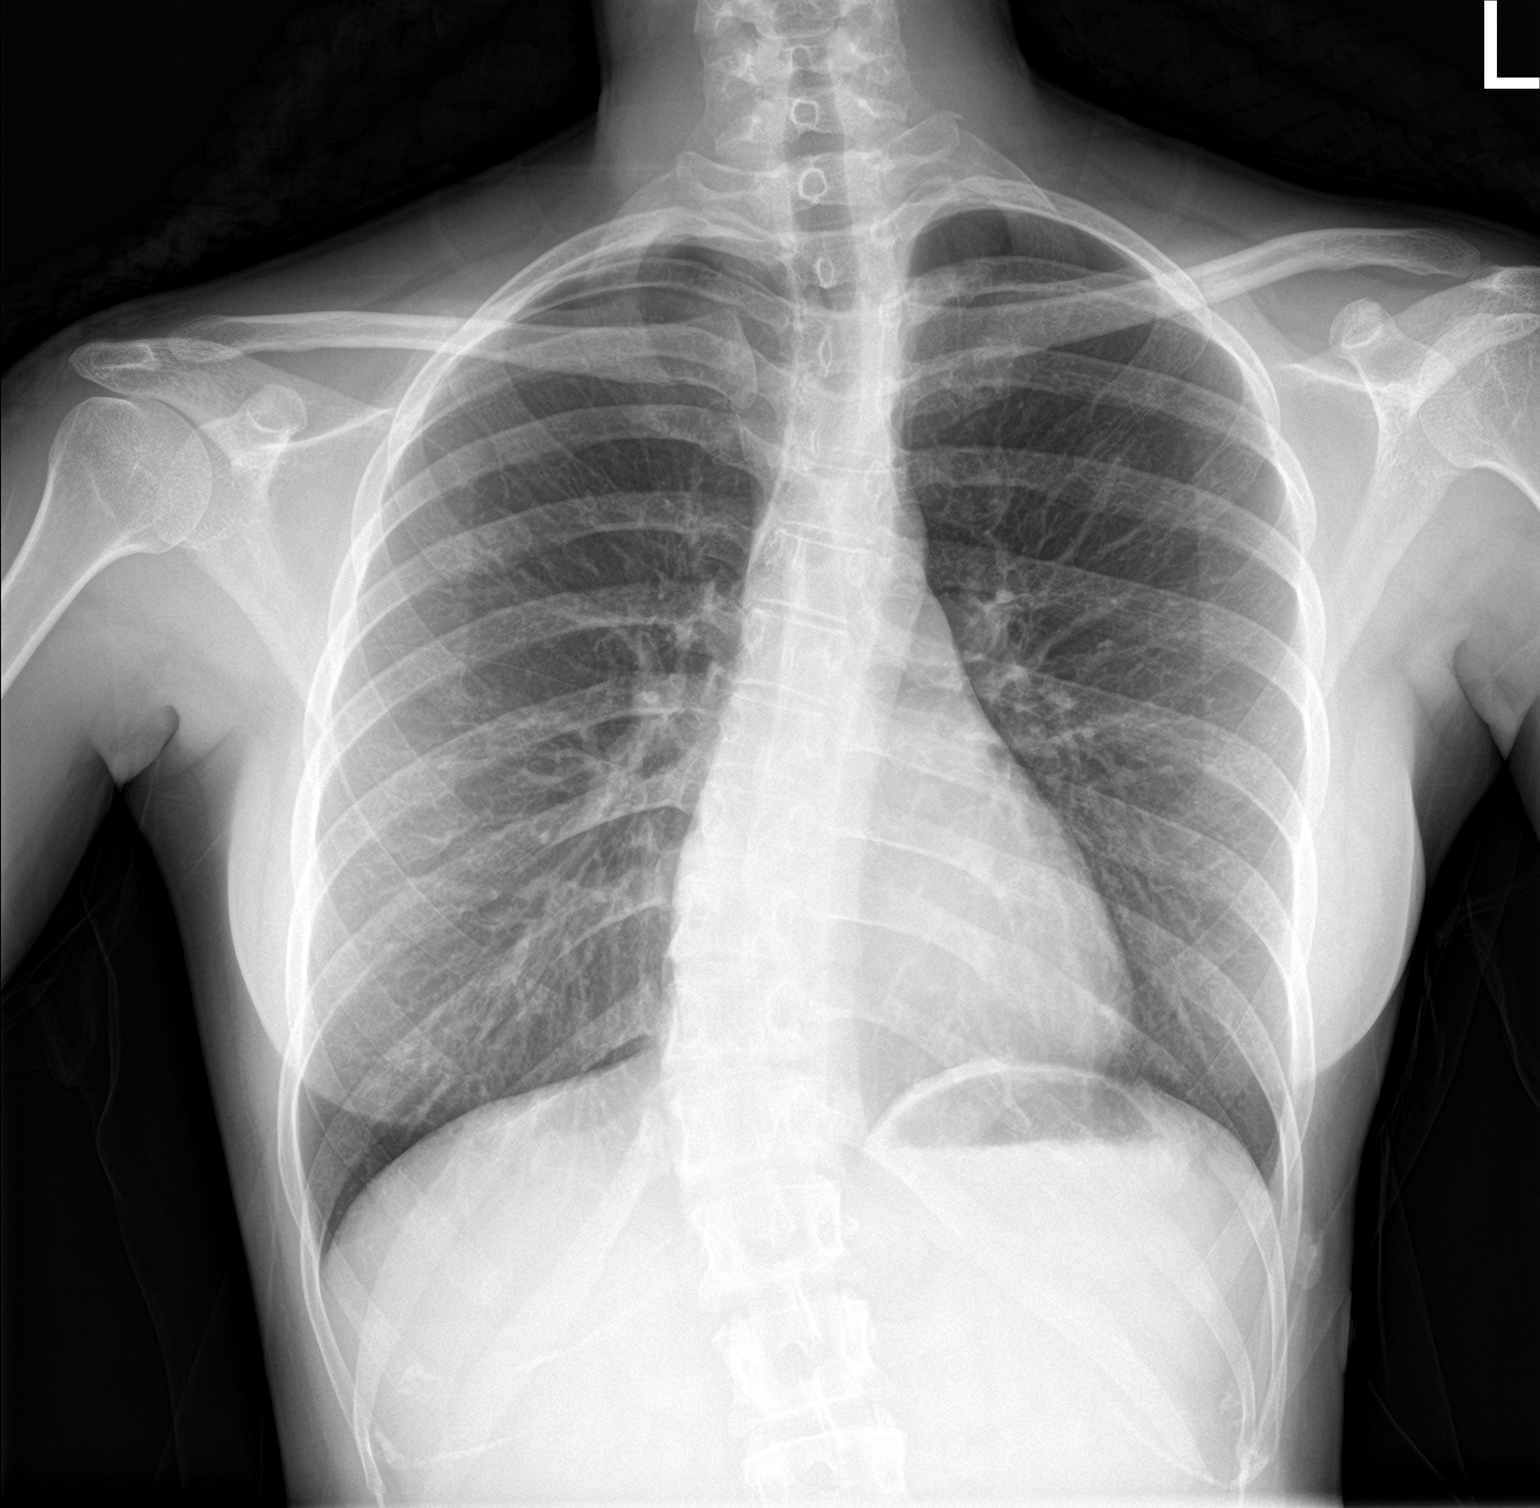

[chest lat]
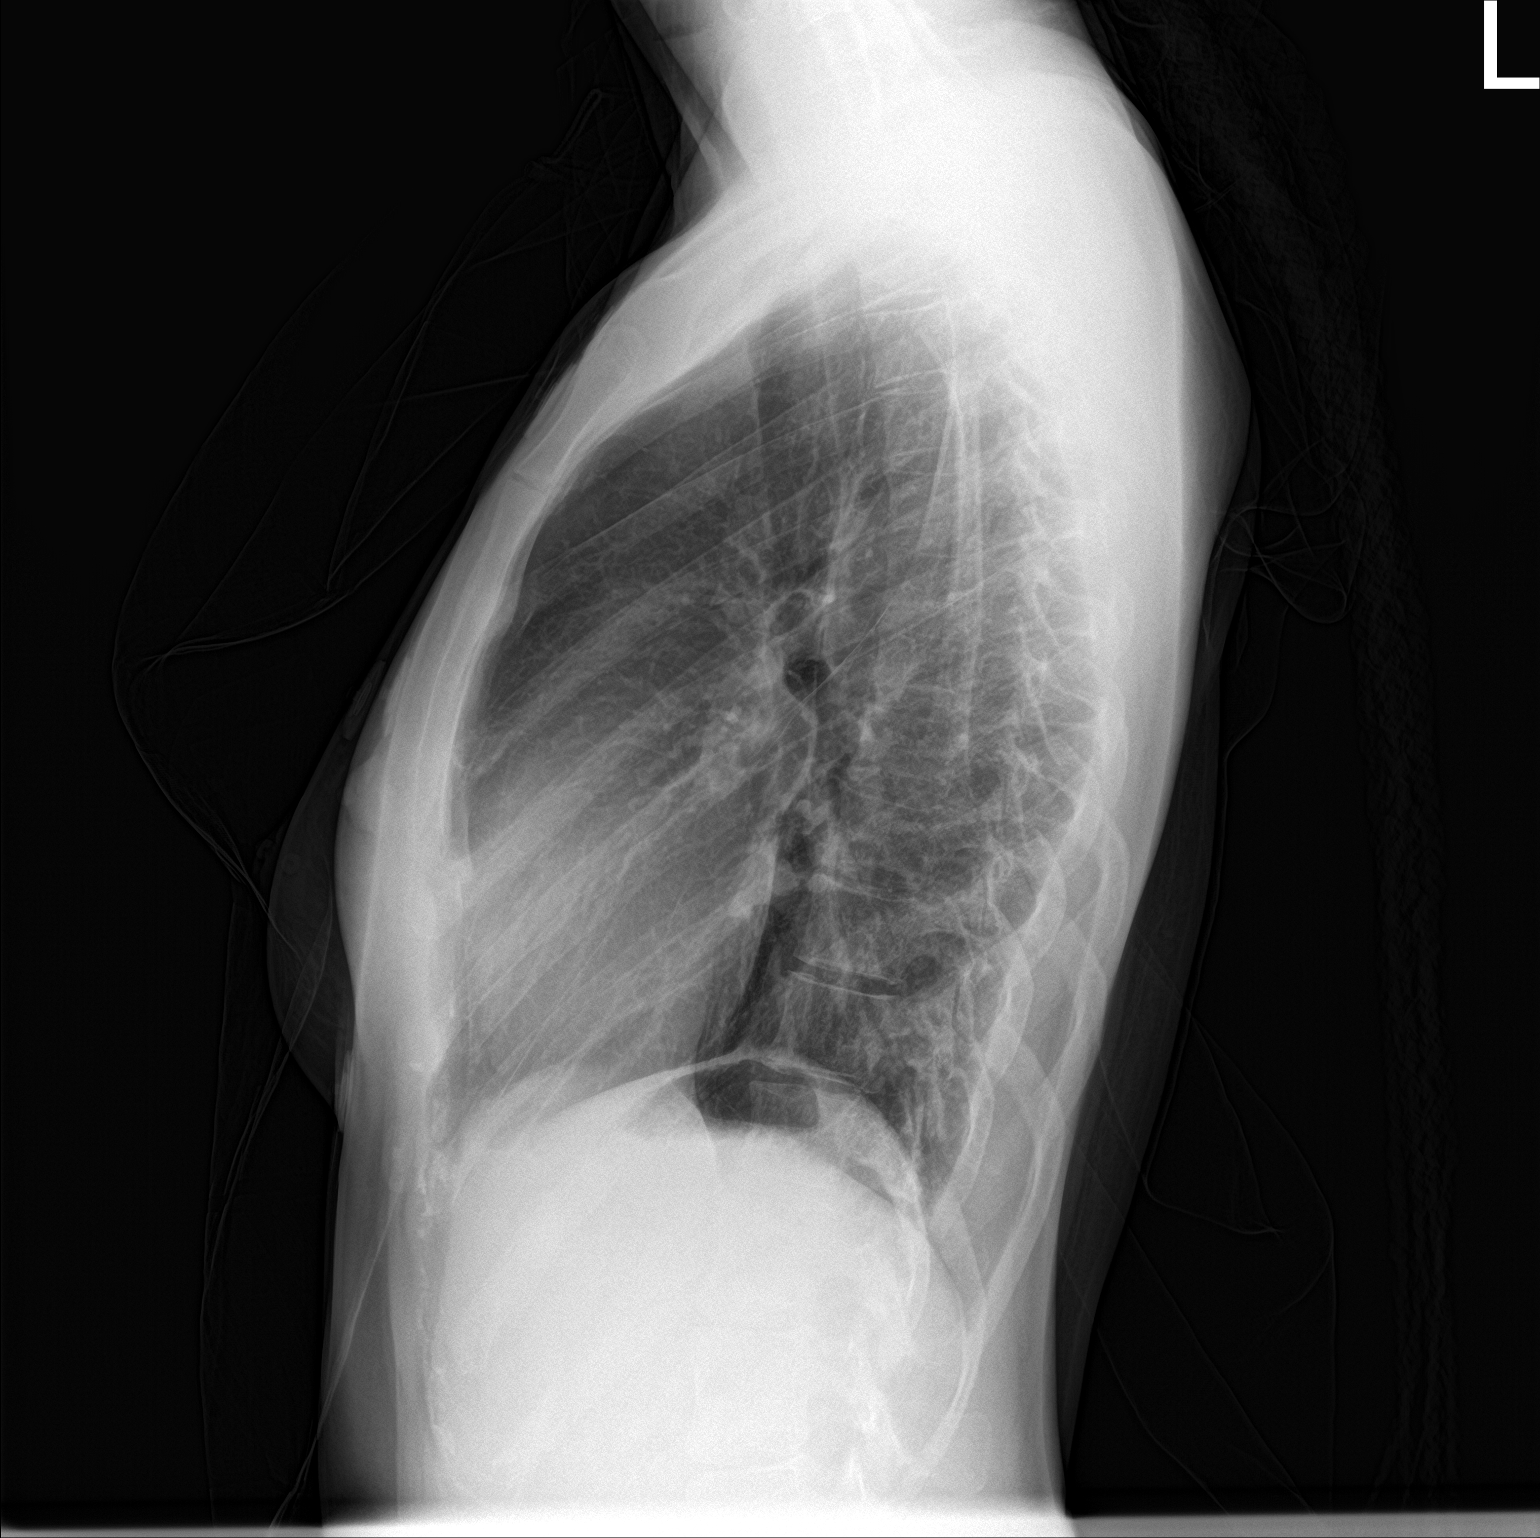

[2 of 2 positions shown; findings below may reference images not displayed]

FINDINGS: The heart size and mediastinal contours are within normal limits.
Both lungs are clear. Redemonstration of scoliotic curvature with no
acute displaced fracture
IMPRESSION: Negative for acute cardiopulmonary disease

## 2020-06-10 ENCOUNTER — Other Ambulatory Visit: Payer: Self-pay | Admitting: Nurse Practitioner

## 2020-06-10 DIAGNOSIS — L700 Acne vulgaris: Secondary | ICD-10-CM

## 2020-06-10 NOTE — Telephone Encounter (Signed)
Patient needs prior authorization for a refill on Differin 0.1% cream.

## 2020-06-10 NOTE — Telephone Encounter (Signed)
Medication Refill - Medication: Adapalene   Has the patient contacted their pharmacy? Yes.   Pt states that the first time this was prescribed she got 2 of the Triamcinolone creams instead of this medication and didn't realize. She states that there are no more refills. Please advise.  (Agent: If no, request that the patient contact the pharmacy for the refill.) (Agent: If yes, when and what did the pharmacy advise?)  Preferred Pharmacy (with phone number or street name):  [id number] [place], [place] - [address]  [address] [place] [address]  Phone: [phone] Fax: [phone]  Hours: Not open 24 hours     Agent: Please be advised that RX refills may take up to 3 business days. We ask that you follow-up with your pharmacy.

## 2020-06-10 NOTE — Telephone Encounter (Signed)
Pt calling stating that she contacted the pharmacy again and that she is needing to have a PA for this medication as it cost $400+. Please advise.

## 2020-06-12 NOTE — Telephone Encounter (Signed)
Tresa Endo have you received anything concerning a PA for this patient? I'm guessing they don't want to pay for this bc the 0.1% gel is available OTC.

## 2020-06-14 ENCOUNTER — Other Ambulatory Visit: Payer: Self-pay

## 2020-06-14 ENCOUNTER — Encounter: Payer: Self-pay | Admitting: Emergency Medicine

## 2020-06-14 ENCOUNTER — Ambulatory Visit
Admission: EM | Admit: 2020-06-14 | Discharge: 2020-06-14 | Disposition: A | Discharge status: Home / Self Care | Payer: Medicaid Other | Attending: Urgent Care | Admitting: Urgent Care

## 2020-06-14 DIAGNOSIS — H1032 Unspecified acute conjunctivitis, left eye: Secondary | ICD-10-CM | POA: Diagnosis not present

## 2020-06-14 DIAGNOSIS — H5712 Ocular pain, left eye: Secondary | ICD-10-CM

## 2020-06-14 MED ORDER — AZELASTINE HCL 0.05 % OP SOLN: 1.0000 [drp] | Freq: Two times a day (BID) | OPHTHALMIC | 0 refills | Status: DC

## 2020-06-14 MED ORDER — TOBRAMYCIN 0.3 % OP SOLN: 1.0000 [drp] | OPHTHALMIC | 0 refills | Status: DC

## 2020-06-14 NOTE — ED Triage Notes (Addendum)
Pt is present today with c/o of left eye irritation.Pt states that she went to have her lashes done yesterday by a lash tech and notcied that her left eye started irritated her during the process. Pt states that the lash tech flushed her eye out and the burning stopped but still feels like something is in her eye causing irritation and watering.

## 2020-06-14 NOTE — ED Provider Notes (Signed)
[facility]   MRN: [id number] DOB: 11/02/1995  Subjective:   Sharon Waller is a 25 y.o. female presenting for acute onset of left eye irritation, drainage, redness.  Patient states that she had her eyelashes done yesterday and noticed that there was some irritation during the process she has been trying to flush her eye out and still feels like it is irritated, watering.  She has not remove the eyelashes.  Denies vision change, photophobia, eyelid swelling or pain, frank drainage.  No current facility-administered medications for this encounter.  Current Outpatient Medications:  .  adapalene (DIFFERIN) 0.1 % cream, Apply topically at bedtime. For acne, Disp: 90 g, Rfl: 1 .  triamcinolone (KENALOG) 0.1 %, Apply 1 application topically 2 (two) times daily. For eczema, Disp: 60 g, Rfl: 1   Allergies  Allergen Reactions  . Augmentin [Amoxicillin-Pot Clavulanate] Rash    Has patient had a PCN reaction causing immediate rash, facial/tongue/throat swelling, SOB or lightheadedness with hypotension: Yes Has patient had a PCN reaction causing severe rash involving mucus membranes or skin necrosis: No Has patient had a PCN reaction that required hospitalization Yes Has patient had a PCN reaction occurring within the last 10 years: No If all of the above answers are "NO", then may proceed with Cephalosporin use.     Past Medical History:  Diagnosis Date  . Abdominal pain affecting pregnancy, antepartum 09/26/2015   [redacted]w[redacted]d on 09/23/15   . Allergic rhinitis 07/11/2018  . Asthma   . Bacterial vaginosis   . Bronchitis   . Choroid plexus cyst of fetus 01/07/2016  . Mild intermittent asthma 07/11/2018  . SVD (spontaneous vaginal delivery) 05/04/2016  . UTI (lower urinary tract infection)      Past Surgical History:  Procedure Laterality Date  . NO PAST SURGERIES      Family History  Problem Relation Age of Onset  . Cancer Maternal Aunt   . Cancer Maternal Grandmother   .  Breast cancer Maternal Grandmother   . Allergic rhinitis Father   . Asthma Brother        as child  . Allergic rhinitis Paternal Grandmother   . Allergic rhinitis Paternal Grandfather     Social History   Tobacco Use  . Smoking status: Former Smoker    Types: Cigarettes    Quit date: 09/30/2015    Years since quitting: 4.7  . Smokeless tobacco: Never Used  Vaping Use  . Vaping Use: Never used  Substance Use Topics  . Alcohol use: No  . Drug use: No    ROS   Objective:   Vitals: BP 99/63 (BP Location: Right Arm)   Pulse 86   Temp 98.7 F (37.1 C) (Oral)   Resp 17   LMP 05/25/2020   SpO2 98%   Physical Exam Constitutional:      General: She is not in acute distress.    Appearance: Normal appearance. She is well-developed. She is not ill-appearing, toxic-appearing or diaphoretic.  HENT:     Head: Normocephalic and atraumatic.     Nose: Nose normal.     Mouth/Throat:     Mouth: Mucous membranes are moist.     Pharynx: Oropharynx is clear.  Eyes:     General: Lids are everted, no foreign bodies appreciated. No scleral icterus.       Right eye: No discharge.        Left eye: Discharge present.No foreign body or hordeolum.     Extraocular Movements: Extraocular movements  intact.     Right eye: Normal extraocular motion and no nystagmus.     Left eye: Normal extraocular motion and no nystagmus.     Conjunctiva/sclera:     Right eye: Right conjunctiva is not injected. No chemosis, exudate or hemorrhage.    Left eye: Left conjunctiva is injected. No chemosis, exudate or hemorrhage.    Pupils: Pupils are equal, round, and reactive to light.  Cardiovascular:     Rate and Rhythm: Normal rate.  Pulmonary:     Effort: Pulmonary effort is normal.  Skin:    General: Skin is warm and dry.  Neurological:     General: No focal deficit present.     Mental Status: She is alert and oriented to person, place, and time.  Psychiatric:        Mood and Affect: Mood normal.         Behavior: Behavior normal.        Thought Content: Thought content normal.        Judgment: Judgment normal.     Patient refused tetracaine drops and fluorescein stain.  Assessment and Plan :   PDMP not reviewed this encounter.  1. Acute conjunctivitis of left eye, unspecified acute conjunctivitis type   2. Left eye pain     As patient refused the eye procedure as above, could not fully evaluate for foreign body.  Offered her tobramycin eyedrops for bacterial conjunctivitis, azelastine for an allergic conjunctivitis.  Emphasized need to remove the eyelashes.  Follow-up with ophthalmology ASAP. Counseled patient on potential for adverse effects with medications prescribed/recommended today, ER and return-to-clinic precautions discussed, patient verbalized understanding.    Wallis Bamberg, [place] 06/14/20 1118

## 2020-06-15 ENCOUNTER — Other Ambulatory Visit: Payer: Self-pay | Admitting: Nurse Practitioner

## 2020-06-15 DIAGNOSIS — L700 Acne vulgaris: Secondary | ICD-10-CM

## 2020-06-15 DIAGNOSIS — L309 Dermatitis, unspecified: Secondary | ICD-10-CM

## 2020-06-15 MED ORDER — ADAPALENE 0.1 % EX CREA: TOPICAL_CREAM | Freq: Every day | CUTANEOUS | 1 refills | Status: DC

## 2020-06-15 NOTE — Telephone Encounter (Signed)
[facility] called asking for Misty Stanley to call him back.   Could not get through to the office.

## 2020-06-15 NOTE — Telephone Encounter (Signed)
Medication Refill - Medication:   adapalene (DIFFERIN) 0.1 % cream  triamcinolone (KENALOG) 0.1 %    Has the patient contacted their pharmacy? Yes.  contact pcp.  (Agent: If no, request that the patient contact the pharmacy for the refill.) (Agent: If yes, when and what did the pharmacy advise?)  Preferred Pharmacy (with phone number or street name):   [id number] [place], [place] - [address]  [address] [place] [address]  Phone: [phone] Fax: [phone]    Agent: Please be advised that RX refills may take up to 3 business days. We ask that you follow-up with your pharmacy.

## 2020-06-15 NOTE — Telephone Encounter (Signed)
[facility] and spoke to [place], [facility] about the refills requested. Advised it was sent on 04/22/20 both with 1 refill. He says he has them both and the adapalene is requiring a PA, but he will refill the triamcinolone cream for the patient.

## 2020-06-15 NOTE — Telephone Encounter (Signed)
Requested medication (s) are due for refill today: Yes  Requested medication (s) are on the active medication list: Yes  Last refill:  04/22/20  Future visit scheduled: No  Notes to clinic:  Rx needs Prior Authorization in order to fill per pharmacy, see notes.     Requested Prescriptions  Pending Prescriptions Disp Refills   adapalene (DIFFERIN) 0.1 % cream 90 g 1    Sig: Apply topically at bedtime. For acne      Dermatology:  Acne preparations Passed - 06/15/2020  1:36 PM      Passed - Valid encounter within last 12 months    Recent Outpatient Visits           1 month ago Encounter for Papanicolaou smear for cervical cancer screening   [place] [facility] [place], Shea Stakes, NP   1 month ago Fatigue, unspecified type   [facility] [place], Shea Stakes, NP   6 months ago Allergic rhinitis, unspecified seasonality, unspecified trigger   [place] [place], [place], MD   1 year ago Palpitations   [place] [facility] [place], [place], MD   1 year ago Urinary frequency   [place] [facility] [place], Bee [place], MD                 Refused Prescriptions Disp Refills   triamcinolone (KENALOG) 0.1 % 60 g 1    Sig: Apply 1 application topically 2 (two) times daily. For eczema      Dermatology:  Corticosteroids Passed - 06/15/2020  1:36 PM      Passed - Valid encounter within last 12 months    Recent Outpatient Visits           1 month ago Encounter for Papanicolaou smear for cervical cancer screening   [place] [facility] [place], Shea Stakes, NP   1 month ago Fatigue, unspecified type   [facility] [place], Shea Stakes, NP   6 months ago Allergic rhinitis, unspecified seasonality, unspecified trigger   [place] [facility] [place], [place], MD   1 year ago Palpitations   [facility] [place], [place], MD   1 year ago Urinary frequency    [facility] [place], MD

## 2020-06-15 NOTE — Telephone Encounter (Signed)
Patient should have refill on file. Will contact pharmacy back after 2 when they return from lunch

## 2020-07-03 ENCOUNTER — Ambulatory Visit
Admission: EM | Admit: 2020-07-03 | Discharge: 2020-07-03 | Disposition: A | Discharge status: Home / Self Care | Payer: Medicaid Other | Attending: Family Medicine | Admitting: Family Medicine

## 2020-07-03 ENCOUNTER — Other Ambulatory Visit: Payer: Self-pay

## 2020-07-03 DIAGNOSIS — R519 Headache, unspecified: Secondary | ICD-10-CM | POA: Diagnosis not present

## 2020-07-03 DIAGNOSIS — J3089 Other allergic rhinitis: Secondary | ICD-10-CM | POA: Diagnosis not present

## 2020-07-03 MED ORDER — PREDNISONE 20 MG PO TABS: 40.0000 mg | ORAL_TABLET | Freq: Every day | ORAL | 0 refills | Status: DC

## 2020-07-03 MED ORDER — NAPROXEN 500 MG PO TABS: 500.0000 mg | ORAL_TABLET | Freq: Two times a day (BID) | ORAL | 0 refills | Status: DC | PRN

## 2020-07-03 NOTE — ED Provider Notes (Addendum)
[facility]    CSN: [id number] Arrival date & time: 07/03/20  1624      History   Chief Complaint Chief Complaint  Patient presents with  . Headache    HPI Sharon Waller is a 25 y.o. female.   Patient presenting today with 1 week history of left sided headache, sinus pressure that worsens when leaning forward or backwards.  She states it does feel like it is in her sinuses but also in her temple region.  She denies past history of similar headaches, vision changes, dizziness, syncope, recent head injury, nausea vomiting, speech or mental status changes, dysphagia.  She does have a history of seasonal allergies and not taking anything for this thus far.  No known sick contacts.     Past Medical History:  Diagnosis Date  . Abdominal pain affecting pregnancy, antepartum 09/26/2015   [redacted]w[redacted]d on 09/23/15   . Allergic rhinitis 07/11/2018  . Asthma   . Bacterial vaginosis   . Bronchitis   . Choroid plexus cyst of fetus 01/07/2016  . Mild intermittent asthma 07/11/2018  . SVD (spontaneous vaginal delivery) 05/04/2016  . UTI (lower urinary tract infection)     Patient Active Problem List   Diagnosis Date Noted  . Palpitations 07/17/2018  . SOB (shortness of breath) 07/17/2018  . Chest pain of uncertain etiology 07/17/2018  . Mild intermittent asthma 07/11/2018  . Allergic rhinitis 07/11/2018    Past Surgical History:  Procedure Laterality Date  . NO PAST SURGERIES      OB History    Gravida  2   Para  1   Term  1   Preterm  0   AB  0   Living  1     SAB  0   IAB  0   Ectopic  0   Multiple  0   Live Births  1            Home Medications    Prior to Admission medications   Medication Sig Start Date End Date Taking? Authorizing Provider  naproxen (NAPROSYN) 500 MG tablet Take 1 tablet (500 mg total) by mouth 2 (two) times daily as needed. 07/03/20  Yes Particia Nearing, PA-C  predniSONE (DELTASONE) 20 MG tablet Take 2 tablets (40  mg total) by mouth daily with breakfast. 07/03/20  Yes Particia Nearing, PA-C  adapalene (DIFFERIN) 0.1 % cream Apply topically at bedtime. For acne 06/15/20   Claiborne Rigg, NP  azelastine (OPTIVAR) 0.05 % ophthalmic solution Place 1 drop into the left eye 2 (two) times daily. 06/14/20   Wallis Bamberg, PA-C  tobramycin (TOBREX) 0.3 % ophthalmic solution Place 1 drop into the left eye every 4 (four) hours. 06/14/20   Wallis Bamberg, PA-C  triamcinolone (KENALOG) 0.1 % Apply 1 application topically 2 (two) times daily. For eczema 04/22/20   Claiborne Rigg, NP    Family History Family History  Problem Relation Age of Onset  . Cancer Maternal Aunt   . Cancer Maternal Grandmother   . Breast cancer Maternal Grandmother   . Allergic rhinitis Father   . Asthma Brother        as child  . Allergic rhinitis Paternal Grandmother   . Allergic rhinitis Paternal Grandfather     Social History Social History   Tobacco Use  . Smoking status: Former Smoker    Types: Cigarettes    Quit date: 09/30/2015    Years since quitting: 4.7  . Smokeless  tobacco: Never Used  Vaping Use  . Vaping Use: Never used  Substance Use Topics  . Alcohol use: No  . Drug use: No     Allergies   Augmentin [amoxicillin-pot clavulanate]   Review of Systems Review of Systems Per HPI  Physical Exam Triage Vital Signs ED Triage Vitals  Enc Vitals Group     BP 07/03/20 1641 111/79     Pulse Rate 07/03/20 1641 70     Resp 07/03/20 1641 18     Temp 07/03/20 1641 98.6 F (37 C)     Temp Source 07/03/20 1641 Oral     SpO2 07/03/20 1641 98 %     Weight --      Height --      Head Circumference --      Peak Flow --      Pain Score 07/03/20 1642 8     Pain Loc --      Pain Edu? --      Excl. in GC? --    No data found.  Updated Vital Signs BP 111/79 (BP Location: Left Arm)   Pulse 70   Temp 98.6 F (37 C) (Oral)   Resp 18   LMP 06/23/2020   SpO2 98%   Visual Acuity Right Eye Distance:   Left Eye  Distance:   Bilateral Distance:    Right Eye Near:   Left Eye Near:    Bilateral Near:     Physical Exam Vitals and nursing note reviewed.  Constitutional:      Appearance: Normal appearance. She is not ill-appearing.  HENT:     Head: Atraumatic.     Right Ear: Tympanic membrane normal.     Left Ear: Tympanic membrane normal.     Nose:     Comments: Nasal turbinates boggy, erythematous, edematous bilaterally    Mouth/Throat:     Mouth: Mucous membranes are moist.     Pharynx: No oropharyngeal exudate or posterior oropharyngeal erythema.  Eyes:     Extraocular Movements: Extraocular movements intact.     Conjunctiva/sclera: Conjunctivae normal.  Cardiovascular:     Rate and Rhythm: Normal rate and regular rhythm.     Heart sounds: Normal heart sounds.  Pulmonary:     Effort: Pulmonary effort is normal.     Breath sounds: Normal breath sounds.  Abdominal:     General: Bowel sounds are normal. There is no distension.     Palpations: Abdomen is soft.     Tenderness: There is no abdominal tenderness. There is no guarding.  Musculoskeletal:        General: Normal range of motion.     Cervical back: Normal range of motion and neck supple.  Skin:    General: Skin is warm and dry.  Neurological:     General: No focal deficit present.     Mental Status: She is alert and oriented to person, place, and time.     Cranial Nerves: No cranial nerve deficit.     Motor: No weakness.  Psychiatric:        Mood and Affect: Mood normal.        Thought Content: Thought content normal.        Judgment: Judgment normal.     UC Treatments / Results  Labs (all labs ordered are listed, but only abnormal results are displayed) Labs Reviewed - No data to display  EKG   Radiology No results found.  Procedures Procedures (including critical care time)  Medications Ordered in UC Medications - No data to display  Initial Impression / Assessment and Plan / UC Course  I have reviewed  the triage vital signs and the nursing notes.  Pertinent labs & imaging results that were available during my care of the patient were reviewed by me and considered in my medical decision making (see chart for details).     Possibly sinus/cluster headache, particularly given recent weather changes and allergens.  Not fully consistent with an initial migraine, neurologic status intact, no red flag signs at this time.  She declines any IM medications today so we will give p.o. prednisone and naproxen to help relieve possible inflammation and reduce pain.  Discussed antihistamines, nasal spray to help control allergies and sinus symptoms.  Follow-up with primary care for recheck if not fully resolving.  Return to the ED immediately for acutely worsening symptoms.  Final Clinical Impressions(s) / UC Diagnoses   Final diagnoses:  Acute intractable headache, unspecified headache type  Seasonal allergic rhinitis due to other allergic trigger   Discharge Instructions   None    ED Prescriptions    Medication Sig Dispense Auth. Provider   predniSONE (DELTASONE) 20 MG tablet Take 2 tablets (40 mg total) by mouth daily with breakfast. 10 tablet Particia Nearing, PA-C   naproxen (NAPROSYN) 500 MG tablet Take 1 tablet (500 mg total) by mouth 2 (two) times daily as needed. 30 tablet Particia Nearing, [place]     PDMP not reviewed this encounter.   Particia Nearing, PA-C 07/05/20 0828    Particia Nearing, PA-C 07/05/20 [phone]

## 2020-07-03 NOTE — ED Triage Notes (Signed)
Pt c/o headache to lt side of head x1wk. States pain worse when leaning forward or backwards. Denies vision changes. States took sinus meds with no relief.

## 2020-07-04 IMAGING — US ULTRASOUND LEFT BREAST LIMITED
1 series · 2 of 2 positions shown · non-contrast
Comparison: None

CLINICAL DATA: Patient with history of left breast tenderness.
Patient was placed on steroids and the tenderness improved/resolved.
Subsequently, patient had a small amount of cutaneous redness at the
9 o'clock position. This has subsequently resolved.

EXAM:
ULTRASOUND OF THE LEFT BREAST

[Series 1: ultrasound left breast limited · 0.05mm/px · 2 of 2 slices shown]
[im 1/2]
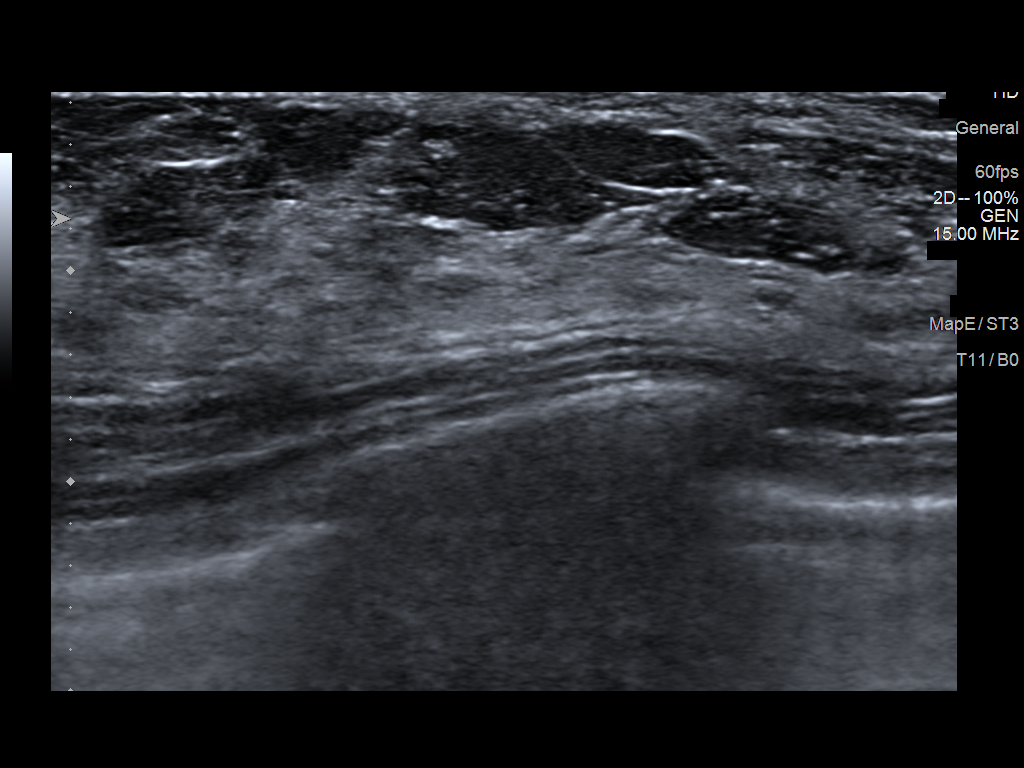
[im 2/2]
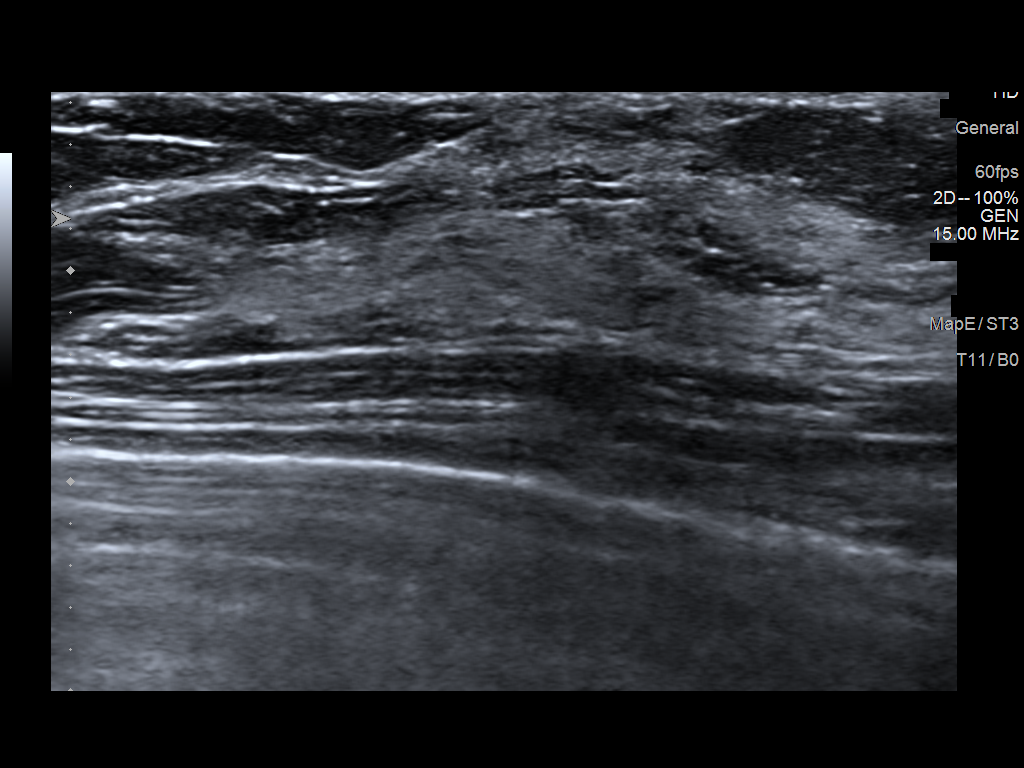

[2 of 2 positions shown; findings below may reference images not displayed]

FINDINGS: Targeted ultrasound is performed, showing no discrete solid or
cystic mass within the left breast 9 o'clock position 2 cm from the
nipple at the site of resolved redness and prior tenderness.
IMPRESSION: No sonographic evidence for malignancy.

RECOMMENDATION:
Continued clinical evaluation for previous reported left breast pain
and cutaneous redness.

Patient was instructed to return for additional evaluation should
the symptoms return or worsen.

Screening mammogram at age 38 unless there are persistent or
intervening clinical concerns. (Code:[id number])

I have discussed the findings and recommendations with the patient.
Results were also provided in writing at the conclusion of the
visit. If applicable, a reminder letter will be sent to the patient
regarding the next appointment.

BI-RADS CATEGORY  1: Negative.

## 2020-07-31 ENCOUNTER — Ambulatory Visit: Payer: Self-pay

## 2020-07-31 NOTE — Telephone Encounter (Signed)
Will forward to provider  

## 2020-07-31 NOTE — Telephone Encounter (Signed)
Pt went urgent care 07-03-2020 and has been having headaches for a month. Pt does not know if its her bp she does not have bp cuff no opening at [facility] and mobile bus does not run on Friday. Please advise Pt. Having headaches x 1 month. Comes and goes. Usually in her forehead. Takes a pain pill for relief. Would like an appointment as soon as possible. Please advise. Answer Assessment - Initial Assessment Questions 1. LOCATION: "Where does it hurt?"      Middle of forehead 2. ONSET: "When did the headache start?" (Minutes, hours or days)      1 month ago 3. PATTERN: "Does the pain come and go, or has it been constant since it started?"     Comes and goes 4. SEVERITY: "How bad is the pain?" and "What does it keep you from doing?"  (e.g., Scale 1-10; mild, moderate, or severe)   - MILD (1-3): doesn't interfere with normal activities    - MODERATE (4-7): interferes with normal activities or awakens from sleep    - SEVERE (8-10): excruciating pain, unable to do any normal activities        8 when she has them 5. RECURRENT SYMPTOM: "Have you ever had headaches before?" If Yes, ask: "When was the last time?" and "What happened that time?"      Yes 6. CAUSE: "What do you think is causing the headache?"     Maybe sinus 7. MIGRAINE: "Have you been diagnosed with migraine headaches?" If Yes, ask: "Is this headache similar?"      No 8. HEAD INJURY: "Has there been any recent injury to the head?"      No 9. OTHER SYMPTOMS: "Do you have any other symptoms?" (fever, stiff neck, eye pain, sore throat, cold symptoms)     Sometimes sinus symptoms 10. PREGNANCY: "Is there any chance you are pregnant?" "When was your last menstrual period?"       No  Protocols used: HEADACHE-A-AH

## 2020-07-31 NOTE — Telephone Encounter (Signed)
Needs to be seen at Urgent care or ED

## 2020-08-04 NOTE — Telephone Encounter (Signed)
Contacted pt to schedule a virtual appt pt didn't answer lvm to return call to schedule . Could we try to reach back out to pt before the end of the day to get her schedule

## 2020-08-05 NOTE — Telephone Encounter (Signed)
Patient scheduled to see Orpah Clinton on 5/27 for headaches

## 2020-08-07 ENCOUNTER — Other Ambulatory Visit: Payer: Self-pay

## 2020-08-07 ENCOUNTER — Encounter: Payer: Self-pay | Admitting: Family

## 2020-08-07 ENCOUNTER — Ambulatory Visit (INDEPENDENT_AMBULATORY_CARE_PROVIDER_SITE_OTHER): Payer: Medicaid Other | Admitting: Family

## 2020-08-07 VITALS — BP 115/75 | HR 68 | Temp 97.7°F | Resp 15 | Ht 65.98 in | Wt 139.8 lb

## 2020-08-07 DIAGNOSIS — G43009 Migraine without aura, not intractable, without status migrainosus: Secondary | ICD-10-CM | POA: Diagnosis not present

## 2020-08-07 MED ORDER — SUMATRIPTAN SUCCINATE 25 MG PO TABS: ORAL_TABLET | ORAL | 0 refills | Status: DC

## 2020-08-07 NOTE — Progress Notes (Signed)
Pt experiencing headaches for weeks, also awakes with headache

## 2020-08-07 NOTE — Progress Notes (Signed)
Patient ID: Sharon Waller, female    DOB: 06-28-1995  MRN: [id number]  CC: Headache  Subjective: Sharon Waller is a 25 y.o. female who presents for headaches.  Visit 07/03/2020 at [facility] per PA note: Possibly sinus/cluster headache, particularly given recent weather changes and allergens.  Not fully consistent with an initial migraine, neurologic status intact, no red flag signs at this time.  She declines any IM medications today so we will give p.o. prednisone and naproxen to help relieve possible inflammation and reduce pain.  Discussed antihistamines, nasal spray to help control allergies and sinus symptoms.  Follow-up with primary care for recheck if not fully resolving.  Return to the ED immediately for acutely worsening symptoms.  08/07/2020: Reports Prednisone did help clear sinuses and headache. Once completed Prednisone symptoms soon returned. Naproxen helped some. Continuing to have daily headaches.  Location: middle and left side of head   Quality: 8/10  Frequency: daily  Precipitating factors: none  Prior treatment: Prednisone, Naproxen Nausea/vomiting: no  Photophobia/phonophobia: no  Tearing of eyes: no  Sinus pain/pressure: yes  Family hx migraine: yes  Personal stressors: no  Relation to menstrual cycle: the week of menses headaches worse   Fever: no  Neck pain/stiffness: no  Vision/speech/swallow/hearing difficulty: no  Focal weakness/numbness: no  Altered mental status: no  Trauma: no   Patient Active Problem List   Diagnosis Date Noted  . Palpitations 07/17/2018  . SOB (shortness of breath) 07/17/2018  . Chest pain of uncertain etiology 07/17/2018  . Mild intermittent asthma 07/11/2018  . Allergic rhinitis 07/11/2018     Current Outpatient Medications on File Prior to Visit  Medication Sig Dispense Refill  . adapalene (DIFFERIN) 0.1 % cream Apply topically at bedtime. For acne 90 g 1  . azelastine (OPTIVAR) 0.05  % ophthalmic solution Place 1 drop into the left eye 2 (two) times daily. 6 mL 0  . naproxen (NAPROSYN) 500 MG tablet Take 1 tablet (500 mg total) by mouth 2 (two) times daily as needed. 30 tablet 0  . predniSONE (DELTASONE) 20 MG tablet Take 2 tablets (40 mg total) by mouth daily with breakfast. 10 tablet 0  . tobramycin (TOBREX) 0.3 % ophthalmic solution Place 1 drop into the left eye every 4 (four) hours. 5 mL 0  . triamcinolone (KENALOG) 0.1 % Apply 1 application topically 2 (two) times daily. For eczema 60 g 1   No current facility-administered medications on file prior to visit.    Allergies  Allergen Reactions  . Augmentin [Amoxicillin-Pot Clavulanate] Rash    Has patient had a PCN reaction causing immediate rash, facial/tongue/throat swelling, SOB or lightheadedness with hypotension: Yes Has patient had a PCN reaction causing severe rash involving mucus membranes or skin necrosis: No Has patient had a PCN reaction that required hospitalization Yes Has patient had a PCN reaction occurring within the last 10 years: No If all of the above answers are "NO", then may proceed with Cephalosporin use.     Social History   Socioeconomic History  . Marital status: Single    Spouse name: Not on file  . Number of children: Not on file  . Years of education: Not on file  . Highest education level: Not on file  Occupational History  . Not on file  Tobacco Use  . Smoking status: Former Smoker    Types: Cigarettes    Quit date: 09/30/2015    Years since quitting: 4.8  .  Smokeless tobacco: Never Used  Vaping Use  . Vaping Use: Never used  Substance and Sexual Activity  . Alcohol use: No  . Drug use: No  . Sexual activity: Yes    Birth control/protection: None  Other Topics Concern  . Not on file  Social History Narrative  . Not on file   Social Determinants of Health   Financial Resource Strain: Not on file  Food Insecurity: Not on file  Transportation Needs: Not on file   Physical Activity: Not on file  Stress: Not on file  Social Connections: Not on file  Intimate Partner Violence: Not on file    Family History  Problem Relation Age of Onset  . Cancer Maternal Aunt   . Cancer Maternal Grandmother   . Breast cancer Maternal Grandmother   . Allergic rhinitis Father   . Asthma Brother        as child  . Allergic rhinitis Paternal Grandmother   . Allergic rhinitis Paternal Grandfather     Past Surgical History:  Procedure Laterality Date  . NO PAST SURGERIES      ROS: Review of Systems Negative except as stated above  PHYSICAL EXAM: BP 115/75 (BP Location: Left Arm, Patient Position: Sitting, Cuff Size: Normal)   Pulse 68   Temp 97.7 F (36.5 C)   Resp 15   Ht 5' 5.98" (1.676 m)   Wt 139 lb 12.8 oz (63.4 kg)   SpO2 97%   BMI 22.58 kg/m   Physical Exam HENT:     Head: Normocephalic and atraumatic.     Right Ear: Tympanic membrane, ear canal and external ear normal.     Left Ear: Tympanic membrane, ear canal and external ear normal.  Eyes:     Extraocular Movements: Extraocular movements intact.     Pupils: Pupils are equal, round, and reactive to light.  Cardiovascular:     Rate and Rhythm: Normal rate and regular rhythm.     Pulses: Normal pulses.     Heart sounds: Normal heart sounds.  Pulmonary:     Effort: Pulmonary effort is normal.     Breath sounds: Normal breath sounds.  Musculoskeletal:     Cervical back: Normal range of motion and neck supple.  Neurological:     General: No focal deficit present.     Mental Status: She is alert and oriented to person, place, and time.  Psychiatric:        Mood and Affect: Mood normal.        Behavior: Behavior normal.    ASSESSMENT AND PLAN: 1. Migraine without aura and without status migrainosus, not intractable: - Begin Sumatriptan as prescribed.  - Referral to Neurology for further management.  - Follow-up with primary provider in 2 weeks or sooner if needed.  -  SUMAtriptan (IMITREX) 25 MG tablet; 25 mg (1 tablet total) by mouth (PO) at the start of the headache. May repeat in 2 hours x 1 if headache persists. Max of 2 tabs/24 hours  Dispense: 20 tablet; Refill: 0 - Ambulatory referral to Neurology   Patient was given the opportunity to ask questions.  Patient verbalized understanding of the plan and was able to repeat key elements of the plan. Patient was given clear instructions to go to Emergency Department or return to medical center if symptoms don't improve, worsen, or new problems develop.The patient verbalized understanding.   Orders Placed This Encounter  Procedures  . Ambulatory referral to Neurology     Requested Prescriptions  Signed Prescriptions Disp Refills  . SUMAtriptan (IMITREX) 25 MG tablet 20 tablet 0    Sig: 25 mg (1 tablet total) by mouth (PO) at the start of the headache. May repeat in 2 hours x 1 if headache persists. Max of 2 tabs/24 hours    Return in about 2 weeks (around 08/21/2020) for Follow-Up Bertram Denver, NP.  Rema Fendt, NP

## 2020-08-07 NOTE — Patient Instructions (Signed)
Chronic Migraine Headache A migraine is a type of headache that is usually stronger and more sudden than other headaches. Migraines are characterized by an intense pulsing, throbbing pain that is usually only present on one side of the head. Migraine pain usually gets worse with activity. Migraines can cause nausea, vomiting, sensitivity to light and sound, and vision changes. Migraines that keep coming back are called recurrent migraines. A migraine is called a chronic migraine if it happens at least 15 days in a month for more than 3 months. Talk with your health care provider about what things may bring on (trigger) your migraines. What are the causes? The exact cause of this condition is not known. However, a migraine may be caused when nerves in the brain become irritated and release chemicals that cause inflammation of blood vessels. The inflammation of the blood vessels causes pain. Migraines may be triggered or caused by:  Smoking.  Certain foods and drinks, such as: ? Aged cheese. ? Chocolate. ? Alcohol. ? Caffeine. ? Foods or drinks that contain nitrates, glutamate, aspartame, MSG, or tyramine.  Medicines, such as birth control pills or some blood pressure medicines. Other things that may trigger a migraine include:  Menstruation.  Emotional stress.  Lack of sleep or too much sleep.  Tiredness (fatigue).  Bright lights or loud noises.  Odors.  Weather changes and high altitude. What increases the risk? The following factors may make you more likely to experience chronic migraine:  Having migraines or a family history of migraines.  Having a mental health condition, such as depression or anxiety.  Having to take a lot of pain medicine.  Having sleep problems.  Having heart disease, diabetes, or obesity. What are the signs or symptoms? Symptoms of a migraine vary for each person and may include:  Pulsating or throbbing pain.  Pain that is usually only present  on one side of the head. In some cases, the pain may be on both sides of the head or around the head or neck.  Severe pain that prevents you from doing daily activities.  Pain that gets worse with physical activity.  Nausea, vomiting, or both.  Pain with exposure to bright lights, loud noises, or activity.  General sensitivity to bright lights, loud noises, or smells.  Dizziness. A sign that a migraine is becoming chronic is an increasing number of migraine episodes. It is considered chronic if the migraine happens at least 15 days in a month for more than 3 months. How is this diagnosed? This condition is often diagnosed based on:  Your symptoms and medical history.  A physical exam. You may also have tests, including:  A CT scan or an MRI of your brain. These imaging tests cannot diagnose migraines, but they can help to rule out other causes of headaches.  Taking fluid from the spine (lumbar puncture) and analyzing it (cerebrospinal fluid analysis, or CSF analysis).  Blood tests. How is this treated? This condition is treated with:  Medicines. These help to: ? Lessen pain and nausea. ? Prevent migraines.  Lifestyle changes, such as changes to your diet or sleeping patterns.  Behavior therapy. This may include: ? Relaxation training. ? Biofeedback. This is a treatment that teaches you to relax and use your brain to lower your heart rate and control your breathing. ? Cognitive behavioral therapy (CBT). This is a form of talk therapy. This therapy helps you set goals and follow up on the changes that you make.  Acupuncture.  Using   a device that provides electrical stimulation to your nerves, which can relieve pain (neuromodulation therapy).  Surgery, if the other treatments are not working. Follow these instructions at home: Medicines  Take over-the-counter and prescription medicines only as told by your health care provider.  Ask your health care provider if the  medicine prescribed to you requires you to avoid driving or using machinery. Lifestyle  Do not use any products that contain nicotine or tobacco, such as cigarettes, e-cigarettes, and chewing tobacco. If you need help quitting, ask your health care provider.  Do not drink alcohol.  Get 7-9 hours of sleep each night, or the amount of sleep recommended by your health care provider.  Find ways to manage stress, such as meditation, deep breathing, or yoga.  Maintain a healthy weight. If you need help losing weight, ask your health care provider.  Exercise regularly. Aim for 150 minutes of moderate-intensity exercise, such as walking, biking, or yoga, or 75 minutes of vigorous exercise each week. Vigorous exercise includes running, circuit training, and swimming.   General instructions  Keep a journal to find out what triggers your migraines so you can avoid these triggers. For example, write down: ? What you eat and drink. ? How much sleep you get. ? Any change to your diet or medicines.  Lie down in a dark, quiet room when you have a migraine.  Try placing a cool towel over your head when you have a migraine.  Keep lights dim, if bright lights bother you or make your migraines worse.  Keep all follow-up visits as told by your health care provider. This is important.   Where to find more information  Coalition for Headache and Migraine Patients (CHAMP): headachemigraine.org  American Migraine Foundation: americanmigrainefoundation.org  National Headache Foundation: headaches.org Contact a health care provider if:  Your pain does not improve, even with medicine.  Your migraines continue to return, even with medicine. Get help right away if:  Your migraine becomes severe and medicine does not help.  You have a stiff neck and fever.  You have a loss of vision.  You have muscle weakness or loss of muscle control.  You start losing your balance, or you have trouble  walking.  You feel like you may faint, or you faint.  You start having sudden and unexpected, severe headaches.  You have a seizure. Summary  Migraine headaches are usually stronger and more sudden than other headaches. Migraines are characterized by an intense pulsing, throbbing pain that is usually only present on one side of the head.  Migraines that keep coming back are called recurrent migraines. A migraine is called a chronic migraine if it happens 15 days in a month for more than 3 months.  Certain things may trigger migraines, such as lack of sleep or too much sleep, smoking, certain foods, alcohol, stress, and certain medicines.  Your treatment plan may include medicines, lifestyle changes, and behavior therapy. This information is not intended to replace advice given to you by your health care provider. Make sure you discuss any questions you have with your health care provider. Document Revised: 04/17/2019 Document Reviewed: 04/17/2019 Elsevier Patient Education  2021 [employer]  

## 2020-08-11 ENCOUNTER — Encounter: Payer: Self-pay | Admitting: Neurology

## 2020-10-01 ENCOUNTER — Other Ambulatory Visit: Payer: Self-pay

## 2020-10-01 ENCOUNTER — Ambulatory Visit
Admission: EM | Admit: 2020-10-01 | Discharge: 2020-10-01 | Disposition: A | Discharge status: Home / Self Care | Payer: Medicaid Other | Attending: Physician Assistant | Admitting: Physician Assistant

## 2020-10-01 ENCOUNTER — Ambulatory Visit: Payer: Medicaid Other

## 2020-10-01 DIAGNOSIS — N898 Other specified noninflammatory disorders of vagina: Secondary | ICD-10-CM | POA: Diagnosis not present

## 2020-10-01 DIAGNOSIS — R8290 Unspecified abnormal findings in urine: Secondary | ICD-10-CM | POA: Diagnosis not present

## 2020-10-01 LAB — POCT URINALYSIS DIP (MANUAL ENTRY)
Bilirubin, UA: NEGATIVE
Glucose, UA: NEGATIVE mg/dL
Ketones, POC UA: NEGATIVE mg/dL
Nitrite, UA: NEGATIVE
Protein Ur, POC: NEGATIVE mg/dL
Spec Grav, UA: 1.015 (ref 1.010–1.025)
Urobilinogen, UA: 0.2 E.U./dL
pH, UA: 6.5 (ref 5.0–8.0)

## 2020-10-01 LAB — POCT URINE PREGNANCY: Preg Test, Ur: NEGATIVE

## 2020-10-01 MED ORDER — METRONIDAZOLE 500 MG PO TABS: 500.0000 mg | ORAL_TABLET | Freq: Two times a day (BID) | ORAL | 0 refills | Status: DC

## 2020-10-01 NOTE — ED Provider Notes (Signed)
[facility]    CSN: [id number] Arrival date & time: 10/01/20  1435      History   Chief Complaint Chief Complaint  Patient presents with   Vaginal Discharge    HPI Sharon Waller is a 25 y.o. female.   Patient presents today with a 1 week history of increased vaginal discharge.  She describes this as white/copious/malodorous.  She does report having unprotected sex recently and is interested in complete STI panel.  She denies any changes to personal hygiene products including soaps or detergents.  Denies any recent antibiotic use or medication changes.  She does have a history of bacterial vaginosis and states current symptoms are similar to previous episodes of this condition.  She denies any abdominal pain, pelvic pain, urinary symptoms, fever, nausea, vomiting.  She has no concern for pregnancy.  She is interested in being treated for bacterial vaginosis today if appropriate.   Past Medical History:  Diagnosis Date   Abdominal pain affecting pregnancy, antepartum 09/26/2015   [redacted]w[redacted]d on 09/23/15    Allergic rhinitis 07/11/2018   Asthma    Bacterial vaginosis    Bronchitis    Choroid plexus cyst of fetus 01/07/2016   Mild intermittent asthma 07/11/2018   SVD (spontaneous vaginal delivery) 05/04/2016   UTI (lower urinary tract infection)     Patient Active Problem List   Diagnosis Date Noted   Palpitations 07/17/2018   SOB (shortness of breath) 07/17/2018   Chest pain of uncertain etiology 07/17/2018   Mild intermittent asthma 07/11/2018   Allergic rhinitis 07/11/2018    Past Surgical History:  Procedure Laterality Date   NO PAST SURGERIES      OB History     Gravida  2   Para  1   Term  1   Preterm  0   AB  0   Living  1      SAB  0   IAB  0   Ectopic  0   Multiple  0   Live Births  1            Home Medications    Prior to Admission medications   Medication Sig Start Date End Date Taking? Authorizing Provider   metroNIDAZOLE (FLAGYL) 500 MG tablet Take 1 tablet (500 mg total) by mouth 2 (two) times daily. 10/01/20  Yes Jasilyn Holderman K, PA-C  adapalene (DIFFERIN) 0.1 % cream Apply topically at bedtime. For acne 06/15/20   Claiborne Rigg, NP  naproxen (NAPROSYN) 500 MG tablet Take 1 tablet (500 mg total) by mouth 2 (two) times daily as needed. 07/03/20   Particia Nearing, PA-C  SUMAtriptan (IMITREX) 25 MG tablet 25 mg (1 tablet total) by mouth (PO) at the start of the headache. May repeat in 2 hours x 1 if headache persists. Max of 2 tabs/24 hours 08/07/20   Rema Fendt, NP  tobramycin (TOBREX) 0.3 % ophthalmic solution Place 1 drop into the left eye every 4 (four) hours. 06/14/20   Wallis Bamberg, PA-C  triamcinolone (KENALOG) 0.1 % Apply 1 application topically 2 (two) times daily. For eczema 04/22/20   Claiborne Rigg, NP    Family History Family History  Problem Relation Age of Onset   Cancer Maternal Aunt    Cancer Maternal Grandmother    Breast cancer Maternal Grandmother    Allergic rhinitis Father    Asthma Brother        as child   Allergic rhinitis Paternal Grandmother  Allergic rhinitis Paternal Grandfather     Social History Social History   Tobacco Use   Smoking status: Former    Types: Cigarettes    Quit date: 09/30/2015    Years since quitting: 5.0   Smokeless tobacco: Never  Vaping Use   Vaping Use: Never used  Substance Use Topics   Alcohol use: No   Drug use: No     Allergies   Augmentin [amoxicillin-pot clavulanate]   Review of Systems Review of Systems  Constitutional:  Negative for activity change, appetite change, fatigue and fever.  Respiratory:  Negative for cough and shortness of breath.   Cardiovascular:  Negative for chest pain.  Gastrointestinal:  Negative for abdominal pain, diarrhea, nausea and vomiting.  Genitourinary:  Positive for vaginal discharge. Negative for dysuria, flank pain, frequency, genital sores, pelvic pain, urgency, vaginal  bleeding and vaginal pain.  Neurological:  Negative for dizziness, light-headedness and headaches.    Physical Exam Triage Vital Signs ED Triage Vitals  Enc Vitals Group     BP 10/01/20 1542 111/75     Pulse Rate 10/01/20 1542 60     Resp 10/01/20 1542 18     Temp 10/01/20 1542 98.1 F (36.7 C)     Temp Source 10/01/20 1542 Oral     SpO2 10/01/20 1542 99 %     Weight --      Height --      Head Circumference --      Peak Flow --      Pain Score 10/01/20 1543 0     Pain Loc --      Pain Edu? --      Excl. in GC? --    No data found.  Updated Vital Signs BP 111/75 (BP Location: Left Arm)   Pulse 60   Temp 98.1 F (36.7 C) (Oral)   Resp 18   SpO2 99%   Visual Acuity Right Eye Distance:   Left Eye Distance:   Bilateral Distance:    Right Eye Near:   Left Eye Near:    Bilateral Near:     Physical Exam Vitals reviewed.  Constitutional:      General: She is awake. She is not in acute distress.    Appearance: Normal appearance. She is normal weight. She is not ill-appearing.     Comments: Very pleasant female appears stated no acute distress  HENT:     Head: Normocephalic and atraumatic.  Cardiovascular:     Rate and Rhythm: Normal rate and regular rhythm.     Heart sounds: Normal heart sounds, S1 normal and S2 normal. No murmur heard. Pulmonary:     Effort: Pulmonary effort is normal.     Breath sounds: Normal breath sounds. No wheezing, rhonchi or rales.     Comments: Clear auscultation bilaterally Abdominal:     General: Bowel sounds are normal.     Palpations: Abdomen is soft.     Tenderness: There is no abdominal tenderness. There is no right CVA tenderness, left CVA tenderness, guarding or rebound.     Comments: Benign abdominal exam; no tenderness palpation.  No CVA tenderness.  Genitourinary:    Comments: Exam deferred Psychiatric:        Behavior: Behavior is cooperative.     UC Treatments / Results  Labs (all labs ordered are listed, but only  abnormal results are displayed) Labs Reviewed  POCT URINALYSIS DIP (MANUAL ENTRY) - Abnormal; Notable for the following components:  Result Value   Color, UA light yellow (*)    Blood, UA trace-intact (*)    Leukocytes, UA Trace (*)    All other components within normal limits  POCT URINE PREGNANCY - Normal  URINE CULTURE  CERVICOVAGINAL ANCILLARY ONLY    EKG   Radiology No results found.  Procedures Procedures (including critical care time)  Medications Ordered in UC Medications - No data to display  Initial Impression / Assessment and Plan / UC Course  I have reviewed the triage vital signs and the nursing notes.  Pertinent labs & imaging results that were available during my care of the patient were reviewed by me and considered in my medical decision making (see chart for details).      UA showed trace leukocyte esterase and blood.  Suspect this is related to vaginal irritation rather than UTI as patient is currently asymptomatic.  We will treat for bacterial vaginosis but send urine off for culture and defer additional antibiotic use until urine culture results are obtained.  STI swab collected today via self swab-results pending.  Discussed that we will arrange additional treatment based on laboratory results.  She was prescribed metronidazole 500 mg to be taken twice daily for 1 week to treat bacterial vaginosis based on clinical presentation.  She was instructed not to drink any alcohol while taking this medication and for 72 hours after completing course due to Antabuse side effects.  Recommended she use hypoallergenic soaps and detergents.  Recommended loosefitting cotton underwear.  Discussed alarm symptoms that warrant emergent evaluation.  Strict return precautions given to which patient expressed understanding.  Final Clinical Impressions(s) / UC Diagnoses   Final diagnoses:  Vaginal discharge  Vaginal odor  Abnormal urinalysis     Discharge Instructions       Your urine had some abnormalities we will send this off for culture.  If it grows anything we will contact you to arrange treatment.  I have called in metronidazole that you will take twice a day for a week.  Do not drink any alcohol while taking this medication or for 3 days after completing course that will cause you to vomit.  Wear loosefitting cotton underwear.  Use hypoallergenic soaps and detergents.  We will contact you with your swab results if we need to arrange additional treatment.  If you have any worsening symptoms please return for reevaluation.     ED Prescriptions     Medication Sig Dispense Auth. Provider   metroNIDAZOLE (FLAGYL) 500 MG tablet Take 1 tablet (500 mg total) by mouth 2 (two) times daily. 14 tablet Madisin Hasan, Noberto Retort, PA-C      PDMP not reviewed this encounter.   Jeani Hawking, PA-C 10/01/20 1601

## 2020-10-01 NOTE — Discharge Instructions (Addendum)
Your urine had some abnormalities we will send this off for culture.  If it grows anything we will contact you to arrange treatment.  I have called in metronidazole that you will take twice a day for a week.  Do not drink any alcohol while taking this medication or for 3 days after completing course that will cause you to vomit.  Wear loosefitting cotton underwear.  Use hypoallergenic soaps and detergents.  We will contact you with your swab results if we need to arrange additional treatment.  If you have any worsening symptoms please return for reevaluation.

## 2020-10-01 NOTE — ED Triage Notes (Signed)
Pt is requesting STD testing. One week h/o vaginal odor and discharge. Denies abdominal pain, hematuria, dysuria and hematuria.

## 2020-10-02 LAB — CERVICOVAGINAL ANCILLARY ONLY
Bacterial Vaginitis (gardnerella): POSITIVE — AB
Candida Glabrata: NEGATIVE
Candida Vaginitis: NEGATIVE
Chlamydia: NEGATIVE
Comment: NEGATIVE
Comment: NEGATIVE
Comment: NEGATIVE
Comment: NEGATIVE
Comment: NEGATIVE
Comment: NORMAL
Neisseria Gonorrhea: NEGATIVE
Trichomonas: NEGATIVE

## 2020-10-03 LAB — URINE CULTURE

## 2020-10-16 ENCOUNTER — Ambulatory Visit (HOSPITAL_COMMUNITY): Admit: 2020-10-16 | Discharge status: Against Medical Advice | Payer: Medicaid Other

## 2020-10-17 ENCOUNTER — Ambulatory Visit: Payer: Medicaid Other

## 2020-10-27 NOTE — Progress Notes (Signed)
NEUROLOGY CONSULTATION NOTE  SHALETTA HINOSTROZA MRN: [id number] DOB: Dec 18, 1995  Referring provider: Ricky Stabs, NP Primary care provider: Bertram Denver, NP  Reason for consult:  headaches  Assessment/Plan:   Migraine without aura, without status migrainosus, not intractable - aggravated by emotional stress that has now be addressed and headaches are manageable and infrequent.  Likely unrelated to conjunctivitis. Follow up as needed.   Subjective:  Sharon Waller is a 25 year old right-handed female with asthma who presents for headaches.  History supplemented by referring provider's note.  History of menstrual headaches.  In early April, she developed conjunctivitis in her left eye for which she was prescribed tobramycin eye drops and azelastine.  She had also been experiencing seasonal allergies.  About a week later, she developed left sided frontal headache, described as severe pressure pain.  Associated photophobia but no nausea, vomiting, phonophobia, autonomic symptoms or visual disturbance.  She also endorsed continued sinus pressure.  It lasted for two weeks.  She was prescribed a prednisone taper which helped.  They would last a couple of hours with sumatriptan and occurring once a week.  However, she was experiencing significant family emotional stressors.  Now that she has addressed that stressor, she is doing much better and has a headache only around her menstrual cycle.    She does have past history of headache for which she had CT head on 01/29/2014 and 12/06/2016, personally reviewed and were normal.  Current NSAIDS/analgesics:  none Current triptans:  sumatriptan 25mg  Current ergotamine:  none Current anti-emetic:  none Current muscle relaxants:  none Current Antihypertensive medications:  none Current Antidepressant medications:  none Current Anticonvulsant medications:  none Current anti-CGRP:  none Current Vitamins/Herbal/Supplements:  none Current  Antihistamines/Decongestants:  none Other therapy:  none Hormone/birth control:  none  Past NSAIDS/analgesics:  ibuprofen naproxen Past abortive triptans:  none Past abortive ergotamine:  none Past muscle relaxants:  cyclobenzaprine, methocarbamol Past anti-emetic:  ondansetron, promethazine Past antihypertensive medications:  diltiazem Past antidepressant medications:  none Past anticonvulsant medications:  none Past anti-CGRP:  none Past vitamins/Herbal/Supplements:  none Past antihistamines/decongestants:  cetirizine, loratadine, diphenhydramine, Sudafed, Flonase Other past therapies:  prednisone  PAST MEDICAL HISTORY: Past Medical History:  Diagnosis Date   Abdominal pain affecting pregnancy, antepartum 09/26/2015   [redacted]w[redacted]d on 09/23/15    Allergic rhinitis 07/11/2018   Asthma    Bacterial vaginosis    Bronchitis    Choroid plexus cyst of fetus 01/07/2016   Mild intermittent asthma 07/11/2018   SVD (spontaneous vaginal delivery) 05/04/2016   UTI (lower urinary tract infection)     PAST SURGICAL HISTORY: Past Surgical History:  Procedure Laterality Date   NO PAST SURGERIES      MEDICATIONS: Current Outpatient Medications on File Prior to Visit  Medication Sig Dispense Refill   adapalene (DIFFERIN) 0.1 % cream Apply topically at bedtime. For acne 90 g 1   metroNIDAZOLE (FLAGYL) 500 MG tablet Take 1 tablet (500 mg total) by mouth 2 (two) times daily. 14 tablet 0   naproxen (NAPROSYN) 500 MG tablet Take 1 tablet (500 mg total) by mouth 2 (two) times daily as needed. 30 tablet 0   SUMAtriptan (IMITREX) 25 MG tablet 25 mg (1 tablet total) by mouth (PO) at the start of the headache. May repeat in 2 hours x 1 if headache persists. Max of 2 tabs/24 hours 20 tablet 0   tobramycin (TOBREX) 0.3 % ophthalmic solution Place 1 drop into the left eye every 4 (four) hours.  5 mL 0   triamcinolone (KENALOG) 0.1 % Apply 1 application topically 2 (two) times daily. For eczema 60 g 1   No  current facility-administered medications on file prior to visit.    ALLERGIES: Allergies  Allergen Reactions   Augmentin [Amoxicillin-Pot Clavulanate] Rash    Has patient had a PCN reaction causing immediate rash, facial/tongue/throat swelling, SOB or lightheadedness with hypotension: Yes Has patient had a PCN reaction causing severe rash involving mucus membranes or skin necrosis: No Has patient had a PCN reaction that required hospitalization Yes Has patient had a PCN reaction occurring within the last 10 years: No If all of the above answers are "NO", then may proceed with Cephalosporin use.     FAMILY HISTORY: Family History  Problem Relation Age of Onset   Cancer Maternal Aunt    Cancer Maternal Grandmother    Breast cancer Maternal Grandmother    Allergic rhinitis Father    Asthma Brother        as child   Allergic rhinitis Paternal Grandmother    Allergic rhinitis Paternal Grandfather     Objective:  Blood pressure 130/77, pulse 66, height 5\' 6"  (1.676 m), weight 138 lb 12.8 oz (63 kg), SpO2 98 %. General: No acute distress.  Patient appears well-groomed.   Head:  Normocephalic/atraumatic Eyes:  fundi examined but not visualized Neck: supple, no paraspinal tenderness, full range of motion Back: No paraspinal tenderness Heart: regular rate and rhythm Lungs: Clear to auscultation bilaterally. Vascular: No carotid bruits. Neurological Exam: Mental status: alert and oriented to person, place, and time, recent and remote memory intact, fund of knowledge intact, attention and concentration intact, speech fluent and not dysarthric, language intact. Cranial nerves: CN I: not tested CN II: pupils equal, round and reactive to light, visual fields intact CN III, IV, VI:  full range of motion, no nystagmus, no ptosis CN V: facial sensation intact. CN VII: upper and lower face symmetric CN VIII: hearing intact CN IX, X: gag intact, uvula midline CN XI: sternocleidomastoid  and trapezius muscles intact CN XII: tongue midline Bulk & Tone: normal, no fasciculations. Motor:  muscle strength 5/5 throughout Sensation:  Pinprick, temperature and vibratory sensation intact. Deep Tendon Reflexes:  2+ throughout,  toes downgoing.   Finger to nose testing:  Without dysmetria.   Heel to shin:  Without dysmetria.   Gait:  Normal station and stride.  Romberg negative.    Thank you for allowing me to take part in the care of this patient.  , DO  CC:  Shon Millet, NP  Ricky Stabs, NP

## 2020-10-28 ENCOUNTER — Other Ambulatory Visit: Payer: Self-pay

## 2020-10-28 ENCOUNTER — Ambulatory Visit: Payer: Medicaid Other | Admitting: Neurology

## 2020-10-28 ENCOUNTER — Encounter: Payer: Self-pay | Admitting: Neurology

## 2020-10-28 VITALS — BP 130/77 | HR 66 | Ht 66.0 in | Wt 138.8 lb

## 2020-10-28 DIAGNOSIS — G43009 Migraine without aura, not intractable, without status migrainosus: Secondary | ICD-10-CM

## 2020-10-28 NOTE — Patient Instructions (Signed)
Agree that the stress was the trigger for the headache

## 2020-11-20 ENCOUNTER — Other Ambulatory Visit: Payer: Self-pay

## 2020-11-20 ENCOUNTER — Encounter: Payer: Self-pay | Admitting: Emergency Medicine

## 2020-11-20 ENCOUNTER — Ambulatory Visit
Admission: EM | Admit: 2020-11-20 | Discharge: 2020-11-20 | Disposition: A | Discharge status: Home / Self Care | Payer: Medicaid Other | Attending: Physician Assistant | Admitting: Physician Assistant

## 2020-11-20 DIAGNOSIS — N926 Irregular menstruation, unspecified: Secondary | ICD-10-CM

## 2020-11-20 DIAGNOSIS — N644 Mastodynia: Secondary | ICD-10-CM | POA: Diagnosis not present

## 2020-11-20 LAB — POCT URINE PREGNANCY: Preg Test, Ur: NEGATIVE

## 2020-11-20 NOTE — ED Triage Notes (Signed)
Patient here for pregnancy testing, period is a day late.

## 2020-11-20 NOTE — ED Provider Notes (Signed)
[facility]    CSN: [id number] Arrival date & time: 11/20/20  1119      History   Chief Complaint Chief Complaint  Patient presents with   Pregnancy Testing    HPI Sharon Waller is a 25 y.o. female.   Patient presents today for pregnancy testing.  Reports that her LMP was 10/25/2020 and according to her app she was counseled to start yesterday.  She took an at-home pregnancy test that said positive prompting evaluation today.  She denies any additional symptoms including pelvic pain, abnormal bleeding, vaginal discharge, nausea, vomiting.  She does report some breast tenderness but is unsure if this is related to her menstrual cycle.  This would be her second pregnancy.  She does not take any over-the-counter or prescription medications on a regular basis.   Past Medical History:  Diagnosis Date   Abdominal pain affecting pregnancy, antepartum 09/26/2015   [redacted]w[redacted]d on 09/23/15    Allergic rhinitis 07/11/2018   Asthma    Bacterial vaginosis    Bronchitis    Choroid plexus cyst of fetus 01/07/2016   Mild intermittent asthma 07/11/2018   SVD (spontaneous vaginal delivery) 05/04/2016   UTI (lower urinary tract infection)     Patient Active Problem List   Diagnosis Date Noted   Palpitations 07/17/2018   SOB (shortness of breath) 07/17/2018   Chest pain of uncertain etiology 07/17/2018   Mild intermittent asthma 07/11/2018   Allergic rhinitis 07/11/2018    Past Surgical History:  Procedure Laterality Date   NO PAST SURGERIES      OB History     Gravida  2   Para  1   Term  1   Preterm  0   AB  0   Living  1      SAB  0   IAB  0   Ectopic  0   Multiple  0   Live Births  1            Home Medications    Prior to Admission medications   Medication Sig Start Date End Date Taking? Authorizing Provider  adapalene (DIFFERIN) 0.1 % cream Apply topically at bedtime. For acne 06/15/20   Claiborne Rigg, NP  metroNIDAZOLE (FLAGYL) 500 MG  tablet Take 1 tablet (500 mg total) by mouth 2 (two) times daily. Patient not taking: Reported on 10/28/2020 10/01/20   Melbert Botelho, Denny Peon K, PA-C  naproxen (NAPROSYN) 500 MG tablet Take 1 tablet (500 mg total) by mouth 2 (two) times daily as needed. Patient not taking: Reported on 10/28/2020 07/03/20   Particia Nearing, PA-C  SUMAtriptan (IMITREX) 25 MG tablet 25 mg (1 tablet total) by mouth (PO) at the start of the headache. May repeat in 2 hours x 1 if headache persists. Max of 2 tabs/24 hours 08/07/20   Rema Fendt, NP  tobramycin (TOBREX) 0.3 % ophthalmic solution Place 1 drop into the left eye every 4 (four) hours. Patient not taking: Reported on 10/28/2020 06/14/20   Wallis Bamberg, PA-C  triamcinolone (KENALOG) 0.1 % Apply 1 application topically 2 (two) times daily. For eczema 04/22/20   Claiborne Rigg, NP    Family History Family History  Problem Relation Age of Onset   Migraines Mother    Allergic rhinitis Father    Asthma Brother        as child   Cancer Maternal Aunt    Cancer Maternal Grandmother    Breast cancer Maternal Grandmother  Allergic rhinitis Paternal Grandmother    Allergic rhinitis Paternal Grandfather     Social History Social History   Tobacco Use   Smoking status: Former    Types: Cigarettes    Quit date: 09/30/2015    Years since quitting: 5.1   Smokeless tobacco: Never  Vaping Use   Vaping Use: Never used  Substance Use Topics   Alcohol use: No   Drug use: No     Allergies   Augmentin [amoxicillin-pot clavulanate]   Review of Systems Review of Systems  Constitutional:  Negative for activity change, appetite change, fatigue and fever.  Respiratory:  Negative for cough and shortness of breath.   Cardiovascular:  Negative for chest pain.  Gastrointestinal:  Negative for abdominal pain, diarrhea, nausea and vomiting.  Genitourinary:  Positive for menstrual problem. Negative for vaginal bleeding, vaginal discharge and vaginal pain.   Neurological:  Negative for dizziness, light-headedness and headaches.    Physical Exam Triage Vital Signs ED Triage Vitals  Enc Vitals Group     BP 11/20/20 1224 120/87     Pulse Rate 11/20/20 1224 62     Resp --      Temp --      Temp src --      SpO2 11/20/20 1224 98 %     Weight 11/20/20 1225 135 lb (61.2 kg)     Height 11/20/20 1225 5\' 6"  (1.676 m)     Head Circumference --      Peak Flow --      Pain Score --      Pain Loc --      Pain Edu? --      Excl. in GC? --    No data found.  Updated Vital Signs BP 120/87 (BP Location: Left Arm)   Pulse 62   Ht 5\' 6"  (1.676 m)   Wt 135 lb (61.2 kg)   SpO2 98%   BMI 21.79 kg/m   Visual Acuity Right Eye Distance:   Left Eye Distance:   Bilateral Distance:    Right Eye Near:   Left Eye Near:    Bilateral Near:     Physical Exam Vitals reviewed.  Constitutional:      General: She is awake. She is not in acute distress.    Appearance: Normal appearance. She is normal weight. She is not ill-appearing.     Comments: Very pleasant female appears stated age in no acute distress sitting comfortably in exam room  HENT:     Head: Normocephalic and atraumatic.  Cardiovascular:     Rate and Rhythm: Normal rate and regular rhythm.     Heart sounds: Normal heart sounds, S1 normal and S2 normal. No murmur heard. Pulmonary:     Effort: Pulmonary effort is normal.     Breath sounds: Normal breath sounds. No wheezing, rhonchi or rales.     Comments: Clear auscultation bilaterally Abdominal:     General: Bowel sounds are normal.     Palpations: Abdomen is soft.     Tenderness: There is no abdominal tenderness. There is no right CVA tenderness, left CVA tenderness, guarding or rebound.     Comments: Benign abdominal exam  Psychiatric:        Behavior: Behavior is cooperative.     UC Treatments / Results  Labs (all labs ordered are listed, but only abnormal results are displayed) Labs Reviewed  POCT URINE PREGNANCY     EKG   Radiology No results found.  Procedures  Procedures (including critical care time)  Medications Ordered in UC Medications - No data to display  Initial Impression / Assessment and Plan / UC Course  I have reviewed the triage vital signs and the nursing notes.  Pertinent labs & imaging results that were available during my care of the patient were reviewed by me and considered in my medical decision making (see chart for details).      Urine pregnancy test was negative in clinic today.  Discussed that it is very close to her missed menstrual cycle and so testing at this stage can be inconsistent.  Offered serum hCG levels but patient was not interested in having blood drawn.  We discussed that she should follow-up with her OB/GYN and she was given contact information for local provider and encouraged to call to schedule an appointment.  Discussed that she should not take any over-the-counter medications until she is confident she is not pregnant.  She is to avoid alcohol or any drug use.  Recommended that she avoid uncooked foods or anything that raises her core body temperature.  If she develops any severe symptoms including heavy menstrual bleeding or abdominal pain she should be reevaluated.  Recommended that she follow-up within 5 days particularly if she does not get her menstrual cycle for repeat testing.  Strict return precautions given to which she expressed understanding.  Final Clinical Impressions(s) / UC Diagnoses   Final diagnoses:  Missed period  Breast tenderness in female     Discharge Instructions      Your pregnancy test was negative today.  I would recommend taking a repeat 1 in a few days since it is so soon after your missed period.  I also recommend that you go ahead and follow-up with an OB/GYN since you had a positive at-home test and a negative test here.  They can do blood testing and ultrasound if necessary.  Please call them to schedule an  appointment.  If you develop any abdominal pain/abdominal pain or other symptoms you need to go to the emergency room.     ED Prescriptions   None    PDMP not reviewed this encounter.   Jeani Hawking, PA-C 11/20/20 1246

## 2020-11-20 NOTE — Discharge Instructions (Addendum)
Your pregnancy test was negative today.  I would recommend taking a repeat 1 in a few days since it is so soon after your missed period.  I also recommend that you go ahead and follow-up with an OB/GYN since you had a positive at-home test and a negative test here.  They can do blood testing and ultrasound if necessary.  Please call them to schedule an appointment.  If you develop any abdominal pain/abdominal pain or other symptoms you need to go to the emergency room.

## 2020-11-21 ENCOUNTER — Other Ambulatory Visit: Payer: Self-pay

## 2020-11-21 ENCOUNTER — Emergency Department (HOSPITAL_COMMUNITY)
Admission: EM | Admit: 2020-11-21 | Discharge: 2020-11-21 | Disposition: A | Discharge status: Home / Self Care | Payer: Medicaid Other | Attending: Emergency Medicine | Admitting: Emergency Medicine

## 2020-11-21 DIAGNOSIS — J45909 Unspecified asthma, uncomplicated: Secondary | ICD-10-CM | POA: Insufficient documentation

## 2020-11-21 DIAGNOSIS — R1030 Lower abdominal pain, unspecified: Secondary | ICD-10-CM | POA: Diagnosis not present

## 2020-11-21 DIAGNOSIS — N9489 Other specified conditions associated with female genital organs and menstrual cycle: Secondary | ICD-10-CM | POA: Diagnosis not present

## 2020-11-21 DIAGNOSIS — N939 Abnormal uterine and vaginal bleeding, unspecified: Secondary | ICD-10-CM | POA: Diagnosis not present

## 2020-11-21 DIAGNOSIS — Z87891 Personal history of nicotine dependence: Secondary | ICD-10-CM | POA: Insufficient documentation

## 2020-11-21 DIAGNOSIS — N644 Mastodynia: Secondary | ICD-10-CM | POA: Insufficient documentation

## 2020-11-21 DIAGNOSIS — Z3202 Encounter for pregnancy test, result negative: Secondary | ICD-10-CM | POA: Diagnosis not present

## 2020-11-21 LAB — I-STAT BETA HCG BLOOD, ED (MC, WL, AP ONLY): I-stat hCG, quantitative: <5 m[IU]/mL (ref <5)

## 2020-11-21 LAB — COMPREHENSIVE METABOLIC PANEL
ALT: 16 U/L (ref 0–44)
AST: 15 U/L (ref 15–41)
Albumin: 4.5 g/dL (ref 3.5–5.0)
Alkaline Phosphatase: 53 U/L (ref 38–126)
Anion gap: 8 (ref 5–15)
BUN: 8 mg/dL (ref 6–20)
CO2: 28 mmol/L (ref 22–32)
Calcium: 9.4 mg/dL (ref 8.9–10.3)
Chloride: 103 mmol/L (ref 98–111)
Creatinine, Ser: 0.7 mg/dL (ref 0.44–1.00)
GFR, Estimated: >60 mL/min (ref >60)
Glucose, Bld: 105 mg/dL — ABNORMAL HIGH (ref 70–99)
Potassium: 3.8 mmol/L (ref 3.5–5.1)
Sodium: 139 mmol/L (ref 135–145)
Total Bilirubin: 0.7 mg/dL (ref 0.3–1.2)
Total Protein: 8.3 g/dL — ABNORMAL HIGH (ref 6.5–8.1)

## 2020-11-21 LAB — CBC WITH DIFFERENTIAL/PLATELET
Abs Immature Granulocytes: 0.02 10*3/uL (ref 0.00–0.07)
Basophils Absolute: 0 10*3/uL (ref 0.0–0.1)
Basophils Relative: 0 %
Eosinophils Absolute: 0.1 10*3/uL (ref 0.0–0.5)
Eosinophils Relative: 1 %
HCT: 42.2 % (ref 36.0–46.0)
Hemoglobin: 13.6 g/dL (ref 12.0–15.0)
Immature Granulocytes: 0 %
Lymphocytes Relative: 35 %
Lymphs Abs: 2.4 10*3/uL (ref 0.7–4.0)
MCH: 30.5 pg (ref 26.0–34.0)
MCHC: 32.2 g/dL (ref 30.0–36.0)
MCV: 94.6 fL (ref 80.0–100.0)
Monocytes Absolute: 0.7 10*3/uL (ref 0.1–1.0)
Monocytes Relative: 10 %
Neutro Abs: 3.7 10*3/uL (ref 1.7–7.7)
Neutrophils Relative %: 54 %
Platelets: 339 10*3/uL (ref 150–400)
RBC: 4.46 MIL/uL (ref 3.87–5.11)
RDW: 14.1 % (ref 11.5–15.5)
WBC: 6.9 10*3/uL (ref 4.0–10.5)
nRBC: 0 % (ref 0.0–0.2)

## 2020-11-21 NOTE — ED Provider Notes (Signed)
[facility]-EMERGENCY DEPT Provider Note   CSN: [id number] Arrival date & time: 11/21/20  1747     History Chief Complaint  Patient presents with   Abdominal Cramping   Vaginal Bleeding    Sharon Waller is a [id number] 25 y.o. female presenting to ED after a positive 3rd or 4th at-home urine pregnancy test yesterday evening after being seen at an urgent care where urine pregnancy was negative. Her first positive at-home pregnancy test was on Wednesday which she took after having breast tenderness and suprapubic fullness, she then went to an urgent care where she has a negative urine pregnancy. She had mild spotting yesterday, with menorrhea today resulting in soaked pads. LNMP was about 1 month ago.    Abdominal Cramping  Vaginal Bleeding     Past Medical History:  Diagnosis Date   Abdominal pain affecting pregnancy, antepartum 09/26/2015   [redacted]w[redacted]d on 09/23/15    Allergic rhinitis 07/11/2018   Asthma    Bacterial vaginosis    Bronchitis    Choroid plexus cyst of fetus 01/07/2016   Mild intermittent asthma 07/11/2018   SVD (spontaneous vaginal delivery) 05/04/2016   UTI (lower urinary tract infection)     Patient Active Problem List   Diagnosis Date Noted   Palpitations 07/17/2018   SOB (shortness of breath) 07/17/2018   Chest pain of uncertain etiology 07/17/2018   Mild intermittent asthma 07/11/2018   Allergic rhinitis 07/11/2018    Past Surgical History:  Procedure Laterality Date   NO PAST SURGERIES       OB History     Gravida  2   Para  1   Term  1   Preterm  0   AB  0   Living  1      SAB  0   IAB  0   Ectopic  0   Multiple  0   Live Births  1           Family History  Problem Relation Age of Onset   Migraines Mother    Allergic rhinitis Father    Asthma Brother        as child   Cancer Maternal Aunt    Cancer Maternal Grandmother    Breast cancer Maternal Grandmother    Allergic rhinitis Paternal  Grandmother    Allergic rhinitis Paternal Grandfather     Social History   Tobacco Use   Smoking status: Former    Types: Cigarettes    Quit date: 09/30/2015    Years since quitting: 5.1   Smokeless tobacco: Never  Vaping Use   Vaping Use: Never used  Substance Use Topics   Alcohol use: No   Drug use: No    Home Medications Prior to Admission medications   Medication Sig Start Date End Date Taking? Authorizing Provider  adapalene (DIFFERIN) 0.1 % cream Apply topically at bedtime. For acne 06/15/20   Claiborne Rigg, NP  metroNIDAZOLE (FLAGYL) 500 MG tablet Take 1 tablet (500 mg total) by mouth 2 (two) times daily. Patient not taking: Reported on 10/28/2020 10/01/20   Raspet, Denny Peon K, PA-C  naproxen (NAPROSYN) 500 MG tablet Take 1 tablet (500 mg total) by mouth 2 (two) times daily as needed. Patient not taking: Reported on 10/28/2020 07/03/20   Particia Nearing, PA-C  SUMAtriptan (IMITREX) 25 MG tablet 25 mg (1 tablet total) by mouth (PO) at the start of the headache. May repeat in 2 hours x 1 if headache  persists. Max of 2 tabs/24 hours 08/07/20   Rema Fendt, NP  tobramycin (TOBREX) 0.3 % ophthalmic solution Place 1 drop into the left eye every 4 (four) hours. Patient not taking: Reported on 10/28/2020 06/14/20   Wallis Bamberg, PA-C  triamcinolone (KENALOG) 0.1 % Apply 1 application topically 2 (two) times daily. For eczema 04/22/20   Claiborne Rigg, NP    Allergies    Augmentin [amoxicillin-pot clavulanate]  Review of Systems   Review of Systems  Genitourinary:  Positive for vaginal bleeding.  All other systems reviewed and are negative.  Physical Exam Updated Vital Signs BP 113/79 (BP Location: Left Arm)   Pulse 84   Temp 98.3 F (36.8 C) (Oral)   Resp 18   LMP 10/13/2020   SpO2 98%   Physical Exam Constitutional:      Appearance: Normal appearance.  HENT:     Head: Normocephalic and atraumatic.  Eyes:     Extraocular Movements: Extraocular movements intact.   Cardiovascular:     Rate and Rhythm: Normal rate and regular rhythm.     Pulses: Normal pulses.     Heart sounds: Normal heart sounds.  Pulmonary:     Effort: Pulmonary effort is normal.     Breath sounds: Normal breath sounds.  Abdominal:     General: Abdomen is flat.     Palpations: Abdomen is soft.  Musculoskeletal:     Cervical back: Normal range of motion and neck supple.  Skin:    General: Skin is warm and dry.  Neurological:     General: No focal deficit present.     Mental Status: She is alert and oriented to person, place, and time. Mental status is at baseline.  Psychiatric:        Mood and Affect: Mood normal.        Behavior: Behavior normal.    ED Results / Procedures / Treatments   Labs (all labs ordered are listed, but only abnormal results are displayed) Labs Reviewed  COMPREHENSIVE METABOLIC PANEL - Abnormal; Notable for the following components:      Result Value   Glucose, Bld 105 (*)    Total Protein 8.3 (*)    All other components within normal limits  CBC WITH DIFFERENTIAL/PLATELET  I-STAT BETA HCG BLOOD, ED ([facility], [facility], [facility] ONLY)    EKG None  Radiology No results found.  Procedures Procedures   Medications Ordered in ED Medications - No data to display  ED Course  I have reviewed the triage vital signs and the nursing notes.  Pertinent labs & imaging results that were available during my care of the patient were reviewed by me and considered in my medical decision making (see chart for details).  Clinical Course as of 11/21/20 2203  Sat Nov 21, 2020  2055 I-stat hCG, quantitative: <5.0 [MP]  2126 I-stat hCG, quantitative: <5.0 [MP]    Clinical Course User Index [MP] Carmel Sacramento, MD   MDM Rules/Calculators/A&P                           Pt presenting after positive at home pregnancy test but negative urgent care urine pregnancy test. Her and her partner were trying to get pregnant. She is not on birth control. Her LNMP was about 1  month ago. She currently having vaginal bleeding consistent with her menstrual period. Quantitative hCG was done, and is negative. Pt is made aware that she is no pregnant,  but to return to the ED if she has worsening abdominal pain.    Final Clinical Impression(s) / ED Diagnoses Final diagnoses:  Pregnancy examination or test, negative result    Rx / DC Orders ED Discharge Orders     None        Carmel Sacramento, MD 11/21/20 2204    Charlynne Pander, MD 11/21/20 [phone]

## 2020-11-21 NOTE — ED Provider Notes (Signed)
Emergency Medicine Provider Triage Evaluation Note  Sharon Waller , a 25 y.o. female  was evaluated in triage.  Pt complains of vaginal bleeding, pelvic pain and cramping.  She was seen at a urgent care facility yesterday and had a negative test in the clinic after she had multiple positives at home.  She shows me to pregnancy tests that are positive.  She states that she started having abdominal cramping last night and has had vaginal bleeding today.  She denies any fevers.  Patient states that she has one child and has previously been pregnant 2-4 other times but can't tell me how many other times she has been pregnant total to calculate G and P.    Review of Systems  Positive: Vaginal bleeding, abdominal cramping Negative: syncope  Physical Exam  BP 123/84 (BP Location: Right Arm)   Pulse 85   Temp 98.3 F (36.8 C) (Oral)   Resp 18   LMP 10/13/2020   SpO2 99%  Gen:   Awake, no distress   Resp:  Normal effort  MSK:   Moves extremities without difficulty  Other:    Medical Decision Making  Medically screening exam initiated at 6:20 PM.  Appropriate orders placed.  Sharon Waller was informed that the remainder of the evaluation will be completed by another provider, this initial triage assessment does not replace that evaluation, and the importance of remaining in the ED until their evaluation is complete.  Note: Portions of this report may have been transcribed using voice recognition software. Every effort was made to ensure accuracy; however, inadvertent computerized transcription errors may be present    Norman Clay 11/21/20 1823    Kommor, Wyn Forster, MD 11/21/20 2252

## 2020-11-21 NOTE — ED Triage Notes (Signed)
PT reports several positive pregnancy tests at home.  She states she started having abdominal cramping last night and vaginal bleeding that started today.

## 2020-11-21 NOTE — Discharge Instructions (Signed)
Sharon Waller you presented to the ED because of positive at home pregnancy test. We ran blood B HCG which came back negative, indicating that you are not pregnant. Please return to the ED if you continue to have abnormal bleeding, abdominal pain that does not improve, fever, confusion, or lethargy.

## 2020-12-22 ENCOUNTER — Ambulatory Visit
Admission: EM | Admit: 2020-12-22 | Discharge: 2020-12-22 | Disposition: A | Discharge status: Home / Self Care | Payer: Medicaid Other | Attending: Physician Assistant | Admitting: Physician Assistant

## 2020-12-22 ENCOUNTER — Other Ambulatory Visit: Payer: Self-pay

## 2020-12-22 DIAGNOSIS — J209 Acute bronchitis, unspecified: Secondary | ICD-10-CM | POA: Diagnosis not present

## 2020-12-22 MED ORDER — PREDNISONE 20 MG PO TABS: 40.0000 mg | ORAL_TABLET | Freq: Every day | ORAL | 0 refills | Status: AC

## 2020-12-22 NOTE — Discharge Instructions (Addendum)
Take medication as prescribed. Follow up with any further concerns.  

## 2020-12-22 NOTE — ED Triage Notes (Signed)
Pt c/o cough with associated chest discomfort onset 3 weeks ago. Also sore throat, nasal congestion with drainage, fatigue. Denies headache, body aches or chills, fever, nausea, vomiting, diarrhea, constipation. States tried muxinex, tylenol at home without relief.

## 2020-12-22 NOTE — ED Provider Notes (Signed)
[facility]    CSN: [id number] Arrival date & time: 12/22/20  0827      History   Chief Complaint Chief Complaint  Patient presents with   Cough    HPI Sharon Waller is a 25 y.o. female.   Patient here today for evaluation of cough that has been ongoing for the last 3 weeks.  She reports that cough is productive at times.  She has not had any fever.  She denies any abdominal pain, nausea, vomiting.  She has tried taking multiple over-the-counter medications without significant relief.  Patient reports she does have a history of asthma, and has also been using albuterol inhaler at times without significant relief.  The history is provided by the patient.  Cough Associated symptoms: sore throat   Associated symptoms: no chills, no ear pain, no eye discharge, no fever, no shortness of breath and no wheezing    Past Medical History:  Diagnosis Date   Abdominal pain affecting pregnancy, antepartum 09/26/2015   [redacted]w[redacted]d on 09/23/15    Allergic rhinitis 07/11/2018   Asthma    Bacterial vaginosis    Bronchitis    Choroid plexus cyst of fetus 01/07/2016   Mild intermittent asthma 07/11/2018   SVD (spontaneous vaginal delivery) 05/04/2016   UTI (lower urinary tract infection)     Patient Active Problem List   Diagnosis Date Noted   Palpitations 07/17/2018   SOB (shortness of breath) 07/17/2018   Chest pain of uncertain etiology 07/17/2018   Mild intermittent asthma 07/11/2018   Allergic rhinitis 07/11/2018    Past Surgical History:  Procedure Laterality Date   NO PAST SURGERIES      OB History     Gravida  2   Para  1   Term  1   Preterm  0   AB  0   Living  1      SAB  0   IAB  0   Ectopic  0   Multiple  0   Live Births  1            Home Medications    Prior to Admission medications   Medication Sig Start Date End Date Taking? Authorizing Provider  predniSONE (DELTASONE) 20 MG tablet Take 2 tablets (40 mg total) by mouth daily  with breakfast for 5 days. 12/22/20 12/27/20 Yes Tomi Bamberger, PA-C  adapalene (DIFFERIN) 0.1 % cream Apply topically at bedtime. For acne 06/15/20   Claiborne Rigg, NP  metroNIDAZOLE (FLAGYL) 500 MG tablet Take 1 tablet (500 mg total) by mouth 2 (two) times daily. Patient not taking: Reported on 10/28/2020 10/01/20   Raspet, Denny Peon K, PA-C  naproxen (NAPROSYN) 500 MG tablet Take 1 tablet (500 mg total) by mouth 2 (two) times daily as needed. Patient not taking: Reported on 10/28/2020 07/03/20   Particia Nearing, PA-C  SUMAtriptan (IMITREX) 25 MG tablet 25 mg (1 tablet total) by mouth (PO) at the start of the headache. May repeat in 2 hours x 1 if headache persists. Max of 2 tabs/24 hours 08/07/20   Rema Fendt, NP  tobramycin (TOBREX) 0.3 % ophthalmic solution Place 1 drop into the left eye every 4 (four) hours. Patient not taking: Reported on 10/28/2020 06/14/20   Wallis Bamberg, PA-C  triamcinolone (KENALOG) 0.1 % Apply 1 application topically 2 (two) times daily. For eczema 04/22/20   Claiborne Rigg, NP    Family History Family History  Problem Relation Age of Onset  Migraines Mother    Allergic rhinitis Father    Asthma Brother        as child   Cancer Maternal Aunt    Cancer Maternal Grandmother    Breast cancer Maternal Grandmother    Allergic rhinitis Paternal Grandmother    Allergic rhinitis Paternal Grandfather     Social History Social History   Tobacco Use   Smoking status: Former    Types: Cigarettes    Quit date: 09/30/2015    Years since quitting: 5.2   Smokeless tobacco: Never  Vaping Use   Vaping Use: Never used  Substance Use Topics   Alcohol use: No   Drug use: No     Allergies   Augmentin [amoxicillin-pot clavulanate]   Review of Systems Review of Systems  Constitutional:  Negative for chills and fever.  HENT:  Positive for congestion and sore throat. Negative for ear pain and sinus pressure.   Eyes:  Negative for discharge and redness.   Respiratory:  Positive for cough. Negative for shortness of breath and wheezing.   Gastrointestinal:  Negative for abdominal pain, diarrhea, nausea and vomiting.    Physical Exam Triage Vital Signs ED Triage Vitals  Enc Vitals Group     BP 12/22/20 0835 119/70     Pulse Rate 12/22/20 0835 74     Resp 12/22/20 0835 18     Temp 12/22/20 0835 97.8 F (36.6 C)     Temp Source 12/22/20 0835 Oral     SpO2 12/22/20 0835 98 %     Weight --      Height --      Head Circumference --      Peak Flow --      Pain Score 12/22/20 0836 5     Pain Loc --      Pain Edu? --      Excl. in GC? --    No data found.  Updated Vital Signs BP 119/70 (BP Location: Left Arm)   Pulse 74   Temp 97.8 F (36.6 C) (Oral)   Resp 18   SpO2 98%      Physical Exam Vitals and nursing note reviewed.  Constitutional:      General: She is not in acute distress.    Appearance: Normal appearance. She is not ill-appearing.  HENT:     Head: Normocephalic and atraumatic.     Right Ear: Tympanic membrane normal.     Left Ear: Tympanic membrane normal.     Nose: Congestion present.     Mouth/Throat:     Mouth: Mucous membranes are moist.     Pharynx: No oropharyngeal exudate or posterior oropharyngeal erythema.  Eyes:     Conjunctiva/sclera: Conjunctivae normal.  Cardiovascular:     Rate and Rhythm: Normal rate and regular rhythm.     Heart sounds: Normal heart sounds. No murmur heard. Pulmonary:     Effort: Pulmonary effort is normal. No respiratory distress.     Breath sounds: Normal breath sounds. No wheezing, rhonchi or rales.  Skin:    General: Skin is warm and dry.  Neurological:     Mental Status: She is alert.  Psychiatric:        Mood and Affect: Mood normal.        Thought Content: Thought content normal.     UC Treatments / Results  Labs (all labs ordered are listed, but only abnormal results are displayed) Labs Reviewed - No data to display  EKG  Radiology No results  found.  Procedures Procedures (including critical care time)  Medications Ordered in UC Medications - No data to display  Initial Impression / Assessment and Plan / UC Course  I have reviewed the triage vital signs and the nursing notes.  Pertinent labs & imaging results that were available during my care of the patient were reviewed by me and considered in my medical decision making (see chart for details).  Suspect likely bronchitis, will treat with steroid burst and recommended follow-up if symptoms fail to improve.  Final Clinical Impressions(s) / UC Diagnoses   Final diagnoses:  Acute bronchitis, unspecified organism     Discharge Instructions      Take medication as prescribed. Follow up with any further concerns.      ED Prescriptions     Medication Sig Dispense Auth. Provider   predniSONE (DELTASONE) 20 MG tablet Take 2 tablets (40 mg total) by mouth daily with breakfast for 5 days. 10 tablet Tomi Bamberger, PA-C      PDMP not reviewed this encounter.   Tomi Bamberger, PA-C 12/22/20 [phone]

## 2020-12-26 ENCOUNTER — Ambulatory Visit (INDEPENDENT_AMBULATORY_CARE_PROVIDER_SITE_OTHER): Payer: Medicaid Other

## 2020-12-26 ENCOUNTER — Ambulatory Visit
Admission: EM | Admit: 2020-12-26 | Discharge: 2020-12-26 | Disposition: A | Discharge status: Home / Self Care | Payer: Medicaid Other | Attending: Physician Assistant | Admitting: Physician Assistant

## 2020-12-26 ENCOUNTER — Encounter: Payer: Self-pay | Admitting: General Practice

## 2020-12-26 DIAGNOSIS — R059 Cough, unspecified: Secondary | ICD-10-CM

## 2020-12-26 DIAGNOSIS — R051 Acute cough: Secondary | ICD-10-CM | POA: Diagnosis not present

## 2020-12-26 DIAGNOSIS — J209 Acute bronchitis, unspecified: Secondary | ICD-10-CM | POA: Diagnosis not present

## 2020-12-26 MED ORDER — PROMETHAZINE-DM 6.25-15 MG/5ML PO SYRP: 5.0000 mL | ORAL_SOLUTION | Freq: Four times a day (QID) | ORAL | 0 refills | Status: AC | PRN

## 2020-12-26 MED ORDER — AZITHROMYCIN 250 MG PO TABS: ORAL_TABLET | ORAL | 0 refills | Status: DC

## 2020-12-26 NOTE — Discharge Instructions (Addendum)
Chest xray was clear. Follow up if no gradual improvement over the next week.

## 2020-12-26 NOTE — ED Provider Notes (Signed)
[facility]    CSN: [id number] Arrival date & time: 12/26/20  1102      History   Chief Complaint Chief Complaint  Patient presents with   Cough    HPI Sharon Waller is a 25 y.o. female.   Patient here today for evaluation of continued cough since her last visit here 4 days ago.  She reports that steroids have not been very helpful.  She has not had fever or chills.  She is continue to use Mucinex and Robitussin without significant relief.  She states she is having difficulty sleeping due to cough.  The history is provided by the patient.  Cough Associated symptoms: no chills, no ear pain, no eye discharge, no fever, no shortness of breath, no sore throat and no wheezing    Past Medical History:  Diagnosis Date   Abdominal pain affecting pregnancy, antepartum 09/26/2015   [redacted]w[redacted]d on 09/23/15    Allergic rhinitis 07/11/2018   Asthma    Bacterial vaginosis    Bronchitis    Choroid plexus cyst of fetus 01/07/2016   Mild intermittent asthma 07/11/2018   SVD (spontaneous vaginal delivery) 05/04/2016   UTI (lower urinary tract infection)     Patient Active Problem List   Diagnosis Date Noted   Palpitations 07/17/2018   SOB (shortness of breath) 07/17/2018   Chest pain of uncertain etiology 07/17/2018   Mild intermittent asthma 07/11/2018   Allergic rhinitis 07/11/2018    Past Surgical History:  Procedure Laterality Date   NO PAST SURGERIES      OB History     Gravida  2   Para  1   Term  1   Preterm  0   AB  0   Living  1      SAB  0   IAB  0   Ectopic  0   Multiple  0   Live Births  1            Home Medications    Prior to Admission medications   Medication Sig Start Date End Date Taking? Authorizing Provider  azithromycin (ZITHROMAX Z-PAK) 250 MG tablet Take 2 tabs on day one, 1 tab once daily days 2-5 12/26/20  Yes Tomi Bamberger, PA-C  predniSONE (DELTASONE) 20 MG tablet Take 2 tablets (40 mg total) by mouth daily  with breakfast for 5 days. 12/22/20 12/27/20 Yes Tomi Bamberger, PA-C  promethazine-dextromethorphan (PROMETHAZINE-DM) 6.25-15 MG/5ML syrup Take 5 mLs by mouth 4 (four) times daily as needed for up to 7 days for cough. 12/26/20 01/02/21 Yes Tomi Bamberger, PA-C  adapalene (DIFFERIN) 0.1 % cream Apply topically at bedtime. For acne 06/15/20   Claiborne Rigg, NP  metroNIDAZOLE (FLAGYL) 500 MG tablet Take 1 tablet (500 mg total) by mouth 2 (two) times daily. Patient not taking: Reported on 10/28/2020 10/01/20   Raspet, Denny Peon K, PA-C  naproxen (NAPROSYN) 500 MG tablet Take 1 tablet (500 mg total) by mouth 2 (two) times daily as needed. Patient not taking: Reported on 10/28/2020 07/03/20   Particia Nearing, PA-C  SUMAtriptan (IMITREX) 25 MG tablet 25 mg (1 tablet total) by mouth (PO) at the start of the headache. May repeat in 2 hours x 1 if headache persists. Max of 2 tabs/24 hours 08/07/20   Rema Fendt, NP  tobramycin (TOBREX) 0.3 % ophthalmic solution Place 1 drop into the left eye every 4 (four) hours. Patient not taking: Reported on 10/28/2020 06/14/20  Wallis Bamberg, PA-C  triamcinolone (KENALOG) 0.1 % Apply 1 application topically 2 (two) times daily. For eczema 04/22/20   Claiborne Rigg, NP    Family History Family History  Problem Relation Age of Onset   Migraines Mother    Allergic rhinitis Father    Asthma Brother        as child   Cancer Maternal Aunt    Cancer Maternal Grandmother    Breast cancer Maternal Grandmother    Allergic rhinitis Paternal Grandmother    Allergic rhinitis Paternal Grandfather     Social History Social History   Tobacco Use   Smoking status: Former    Types: Cigarettes    Quit date: 09/30/2015    Years since quitting: 5.2   Smokeless tobacco: Never  Vaping Use   Vaping Use: Never used  Substance Use Topics   Alcohol use: No   Drug use: No     Allergies   Augmentin [amoxicillin-pot clavulanate]   Review of Systems Review of Systems   Constitutional:  Negative for chills and fever.  HENT:  Positive for congestion. Negative for ear pain, sinus pressure and sore throat.   Eyes:  Negative for discharge and redness.  Respiratory:  Positive for cough. Negative for shortness of breath and wheezing.   Gastrointestinal:  Negative for abdominal pain, diarrhea, nausea and vomiting.    Physical Exam Triage Vital Signs ED Triage Vitals  Enc Vitals Group     BP 12/26/20 1158 106/72     Pulse Rate 12/26/20 1158 85     Resp 12/26/20 1158 18     Temp 12/26/20 1158 98 F (36.7 C)     Temp Source 12/26/20 1158 Oral     SpO2 --      Weight --      Height --      Head Circumference --      Peak Flow --      Pain Score 12/26/20 1155 8     Pain Loc --      Pain Edu? --      Excl. in GC? --    No data found.  Updated Vital Signs BP 106/72 (BP Location: Left Arm)   Pulse 85   Temp 98 F (36.7 C) (Oral)   Resp 18   Physical Exam Vitals and nursing note reviewed.  Constitutional:      General: She is not in acute distress.    Appearance: Normal appearance. She is not ill-appearing.  HENT:     Head: Normocephalic and atraumatic.     Right Ear: Tympanic membrane normal.     Left Ear: Tympanic membrane normal.     Nose: Congestion present.     Mouth/Throat:     Mouth: Mucous membranes are moist.     Pharynx: No oropharyngeal exudate or posterior oropharyngeal erythema.  Eyes:     Conjunctiva/sclera: Conjunctivae normal.  Cardiovascular:     Rate and Rhythm: Normal rate and regular rhythm.     Heart sounds: Normal heart sounds. No murmur heard. Pulmonary:     Effort: Pulmonary effort is normal. No respiratory distress.     Breath sounds: Normal breath sounds. No wheezing, rhonchi or rales.  Skin:    General: Skin is warm and dry.  Neurological:     Mental Status: She is alert.  Psychiatric:        Mood and Affect: Mood normal.        Thought Content: Thought content normal.  UC Treatments / Results   Labs (all labs ordered are listed, but only abnormal results are displayed) Labs Reviewed - No data to display  EKG   Radiology DG Chest 2 View  Result Date: 12/26/2020 CLINICAL DATA:  Productive cough EXAM: CHEST - 2 VIEW COMPARISON:  03/10/2020 FINDINGS: The heart size and mediastinal contours are within normal limits. Both lungs are clear. The visualized skeletal structures are unremarkable. IMPRESSION: No acute abnormality of the lungs. Electronically Signed   By: Jearld Lesch M.D.   On: 12/26/2020 12:29    Procedures Procedures (including critical care time)  Medications Ordered in UC Medications - No data to display  Initial Impression / Assessment and Plan / UC Course  I have reviewed the triage vital signs and the nursing notes.  Pertinent labs & imaging results that were available during my care of the patient were reviewed by me and considered in my medical decision making (see chart for details).  Chest x-ray negative for acute findings, will treat with a Z-Pak, and cough syrup prescribed.  Recommended follow-up if no improvement over the next week or sooner with any concerns.  Final Clinical Impressions(s) / UC Diagnoses   Final diagnoses:  Acute cough     Discharge Instructions      Chest xray was clear. Follow up if no gradual improvement over the next week.     ED Prescriptions     Medication Sig Dispense Auth. Provider   azithromycin (ZITHROMAX Z-PAK) 250 MG tablet Take 2 tabs on day one, 1 tab once daily days 2-5 6 tablet Tomi Bamberger, PA-C   promethazine-dextromethorphan (PROMETHAZINE-DM) 6.25-15 MG/5ML syrup Take 5 mLs by mouth 4 (four) times daily as needed for up to 7 days for cough. 118 mL Tomi Bamberger, PA-C      PDMP not reviewed this encounter.   Tomi Bamberger, PA-C 12/26/20 1237

## 2020-12-26 NOTE — ED Triage Notes (Signed)
Tried OTC mucinex, robitussin. Took prednisone today - last dose. No improvement. No chills, no fever. Covid test at home on 12/24/2020, it was negative. Was here on 12/22/2020 for the same symptoms.

## 2021-02-02 ENCOUNTER — Ambulatory Visit: Payer: Self-pay

## 2021-02-02 ENCOUNTER — Ambulatory Visit
Admission: RE | Admit: 2021-02-02 | Discharge: 2021-02-02 | Disposition: A | Discharge status: Home / Self Care | Payer: Medicaid Other | Source: Ambulatory Visit | Attending: Physician Assistant | Admitting: Physician Assistant

## 2021-02-02 VITALS — BP 108/69 | HR 60 | Temp 98.4°F | Resp 16

## 2021-02-02 DIAGNOSIS — N898 Other specified noninflammatory disorders of vagina: Secondary | ICD-10-CM

## 2021-02-02 DIAGNOSIS — M545 Low back pain, unspecified: Secondary | ICD-10-CM

## 2021-02-02 LAB — POCT URINALYSIS DIP (MANUAL ENTRY)
Bilirubin, UA: NEGATIVE
Blood, UA: NEGATIVE
Glucose, UA: NEGATIVE mg/dL
Ketones, POC UA: NEGATIVE mg/dL
Leukocytes, UA: NEGATIVE
Nitrite, UA: NEGATIVE
Protein Ur, POC: NEGATIVE mg/dL
Spec Grav, UA: 1.025 (ref 1.010–1.025)
Urobilinogen, UA: 0.2 E.U./dL
pH, UA: 6.5 (ref 5.0–8.0)

## 2021-02-02 NOTE — ED Triage Notes (Signed)
Pt c/o lower back and rt flank pain with urinary frequency, cloudy, and odorous x1wk. States hx of BV.

## 2021-02-02 NOTE — ED Provider Notes (Addendum)
[facility]    CSN: [id number] Arrival date & time: 02/02/21  1446      History   Chief Complaint Chief Complaint  Patient presents with   appt 3 - uti    HPI Sharon Waller is a 25 y.o. female.   Patient here today for evaluation of urinary frequency, urgency, and low back pain that started a week ago. She does have history of BV. She does report that these symptoms are not similar to what she typically has with BV. She did start a new job and is not sure if this is why her back is hurting. Patient reports she had 2 metronidazole tabs which she took and symptoms of vaginal discharge and odor resolved.  The history is provided by the patient.   Past Medical History:  Diagnosis Date   Abdominal pain affecting pregnancy, antepartum 09/26/2015   [redacted]w[redacted]d on 09/23/15    Allergic rhinitis 07/11/2018   Asthma    Bacterial vaginosis    Bronchitis    Choroid plexus cyst of fetus 01/07/2016   Mild intermittent asthma 07/11/2018   SVD (spontaneous vaginal delivery) 05/04/2016   UTI (lower urinary tract infection)     Patient Active Problem List   Diagnosis Date Noted   Palpitations 07/17/2018   SOB (shortness of breath) 07/17/2018   Chest pain of uncertain etiology 07/17/2018   Mild intermittent asthma 07/11/2018   Allergic rhinitis 07/11/2018    Past Surgical History:  Procedure Laterality Date   NO PAST SURGERIES      OB History     Gravida  2   Para  1   Term  1   Preterm  0   AB  0   Living  1      SAB  0   IAB  0   Ectopic  0   Multiple  0   Live Births  1            Home Medications    Prior to Admission medications   Medication Sig Start Date End Date Taking? Authorizing Provider  adapalene (DIFFERIN) 0.1 % cream Apply topically at bedtime. For acne 06/15/20   Claiborne Rigg, NP  SUMAtriptan (IMITREX) 25 MG tablet 25 mg (1 tablet total) by mouth (PO) at the start of the headache. May repeat in 2 hours x 1 if headache  persists. Max of 2 tabs/24 hours 08/07/20   Rema Fendt, NP  tobramycin (TOBREX) 0.3 % ophthalmic solution Place 1 drop into the left eye every 4 (four) hours. Patient not taking: Reported on 10/28/2020 06/14/20   Wallis Bamberg, PA-C  triamcinolone (KENALOG) 0.1 % Apply 1 application topically 2 (two) times daily. For eczema 04/22/20   Claiborne Rigg, NP    Family History Family History  Problem Relation Age of Onset   Migraines Mother    Allergic rhinitis Father    Asthma Brother        as child   Cancer Maternal Aunt    Cancer Maternal Grandmother    Breast cancer Maternal Grandmother    Allergic rhinitis Paternal Grandmother    Allergic rhinitis Paternal Grandfather     Social History Social History   Tobacco Use   Smoking status: Former    Types: Cigarettes    Quit date: 09/30/2015    Years since quitting: 5.3   Smokeless tobacco: Never  Vaping Use   Vaping Use: Never used  Substance Use Topics   Alcohol use:  No   Drug use: No     Allergies   Augmentin [amoxicillin-pot clavulanate]   Review of Systems Review of Systems  Constitutional:  Negative for chills and fever.  Eyes:  Negative for discharge and redness.  Respiratory:  Negative for shortness of breath.   Cardiovascular:  Negative for chest pain.  Gastrointestinal:  Negative for abdominal pain, nausea and vomiting.  Genitourinary:  Positive for frequency. Negative for dysuria.  Musculoskeletal:  Positive for back pain.    Physical Exam Triage Vital Signs ED Triage Vitals  Enc Vitals Group     BP      Pulse      Resp      Temp      Temp src      SpO2      Weight      Height      Head Circumference      Peak Flow      Pain Score      Pain Loc      Pain Edu?      Excl. in GC?    No data found.  Updated Vital Signs BP 108/69 (BP Location: Left Arm)   Pulse 60   Temp 98.4 F (36.9 C) (Oral)   Resp 16   LMP 01/24/2021   SpO2 98%      Physical Exam Vitals and nursing note reviewed.   Constitutional:      General: She is not in acute distress.    Appearance: Normal appearance. She is not ill-appearing.  HENT:     Head: Normocephalic and atraumatic.  Eyes:     Conjunctiva/sclera: Conjunctivae normal.  Cardiovascular:     Rate and Rhythm: Normal rate.  Pulmonary:     Effort: Pulmonary effort is normal.  Neurological:     Mental Status: She is alert.  Psychiatric:        Mood and Affect: Mood normal.        Behavior: Behavior normal.        Thought Content: Thought content normal.     UC Treatments / Results  Labs (all labs ordered are listed, but only abnormal results are displayed) Labs Reviewed  POCT URINALYSIS DIP (MANUAL ENTRY)  CERVICOVAGINAL ANCILLARY ONLY    EKG   Radiology No results found.  Procedures Procedures (including critical care time)  Medications Ordered in UC Medications - No data to display  Initial Impression / Assessment and Plan / UC Course  I have reviewed the triage vital signs and the nursing notes.  Pertinent labs & imaging results that were available during my care of the patient were reviewed by me and considered in my medical decision making (see chart for details).     UA with no signs of UTI. Will order STD screening given reported vaginal discharge. Recommend follow up with any concerns.   Final Clinical Impressions(s) / UC Diagnoses   Final diagnoses:  Vaginal discharge  Acute bilateral low back pain without sciatica   Discharge Instructions   None    ED Prescriptions   None    PDMP not reviewed this encounter.   Tomi Bamberger, PA-C 02/02/21 1649    Tomi Bamberger, PA-C 02/02/21 1649

## 2021-02-03 ENCOUNTER — Telehealth (HOSPITAL_COMMUNITY): Payer: Self-pay | Admitting: Emergency Medicine

## 2021-02-03 LAB — CERVICOVAGINAL ANCILLARY ONLY
Bacterial Vaginitis (gardnerella): POSITIVE — AB
Candida Glabrata: NEGATIVE
Candida Vaginitis: POSITIVE — AB
Chlamydia: NEGATIVE
Comment: NEGATIVE
Comment: NEGATIVE
Comment: NEGATIVE
Comment: NEGATIVE
Comment: NEGATIVE
Comment: NORMAL
Neisseria Gonorrhea: NEGATIVE
Trichomonas: NEGATIVE

## 2021-02-03 MED ORDER — METRONIDAZOLE 500 MG PO TABS: 500.0000 mg | ORAL_TABLET | Freq: Two times a day (BID) | ORAL | 0 refills | Status: DC

## 2021-02-03 MED ORDER — FLUCONAZOLE 150 MG PO TABS: 150.0000 mg | ORAL_TABLET | Freq: Once | ORAL | 0 refills | Status: AC

## 2021-02-11 ENCOUNTER — Ambulatory Visit: Payer: Self-pay

## 2021-02-25 ENCOUNTER — Ambulatory Visit
Admission: EM | Admit: 2021-02-25 | Discharge: 2021-02-25 | Disposition: A | Discharge status: Home / Self Care | Payer: Medicaid Other | Attending: Physician Assistant | Admitting: Physician Assistant

## 2021-02-25 ENCOUNTER — Other Ambulatory Visit: Payer: Self-pay

## 2021-02-25 ENCOUNTER — Encounter: Payer: Self-pay | Admitting: Emergency Medicine

## 2021-02-25 DIAGNOSIS — Z3201 Encounter for pregnancy test, result positive: Secondary | ICD-10-CM | POA: Diagnosis not present

## 2021-02-25 DIAGNOSIS — Z3A01 Less than 8 weeks gestation of pregnancy: Secondary | ICD-10-CM

## 2021-02-25 LAB — POCT URINE PREGNANCY: Preg Test, Ur: POSITIVE — AB

## 2021-02-25 NOTE — ED Triage Notes (Signed)
Patient took home pregnancy test yesterday, it was positive.  Patient would like to confirm test today.  LMP:  01-23-2021

## 2021-02-25 NOTE — ED Provider Notes (Signed)
[facility]    CSN: [id number] Arrival date & time: 02/25/21  3664      History   Chief Complaint Chief Complaint  Patient presents with   Possible Pregnancy    HPI Sharon Waller is a 25 y.o. female.   Patient here today for pregnancy confirmation.  She reports that she took at home pregnancy test last night that was positive.  Her last menstrual period was November 12.  She does not report any other concerns at this time.  The history is provided by the patient.  Possible Pregnancy Pertinent negatives include no abdominal pain.   Past Medical History:  Diagnosis Date   Abdominal pain affecting pregnancy, antepartum 09/26/2015   [redacted]w[redacted]d on 09/23/15    Allergic rhinitis 07/11/2018   Asthma    Bacterial vaginosis    Bronchitis    Choroid plexus cyst of fetus 01/07/2016   Mild intermittent asthma 07/11/2018   SVD (spontaneous vaginal delivery) 05/04/2016   UTI (lower urinary tract infection)     Patient Active Problem List   Diagnosis Date Noted   Palpitations 07/17/2018   SOB (shortness of breath) 07/17/2018   Chest pain of uncertain etiology 07/17/2018   Mild intermittent asthma 07/11/2018   Allergic rhinitis 07/11/2018    Past Surgical History:  Procedure Laterality Date   NO PAST SURGERIES      OB History     Gravida  3   Para  1   Term  1   Preterm  0   AB  0   Living  1      SAB  0   IAB  0   Ectopic  0   Multiple  0   Live Births  1            Home Medications    Prior to Admission medications   Medication Sig Start Date End Date Taking? Authorizing Provider  adapalene (DIFFERIN) 0.1 % cream Apply topically at bedtime. For acne 06/15/20   Claiborne Rigg, NP  metroNIDAZOLE (FLAGYL) 500 MG tablet Take 1 tablet (500 mg total) by mouth 2 (two) times daily. 02/03/21   Lamptey, Britta Mccreedy, MD  SUMAtriptan (IMITREX) 25 MG tablet 25 mg (1 tablet total) by mouth (PO) at the start of the headache. May repeat in 2 hours x 1  if headache persists. Max of 2 tabs/24 hours 08/07/20   Rema Fendt, NP  tobramycin (TOBREX) 0.3 % ophthalmic solution Place 1 drop into the left eye every 4 (four) hours. Patient not taking: Reported on 10/28/2020 06/14/20   Wallis Bamberg, PA-C  triamcinolone (KENALOG) 0.1 % Apply 1 application topically 2 (two) times daily. For eczema 04/22/20   Claiborne Rigg, NP    Family History Family History  Problem Relation Age of Onset   Migraines Mother    Allergic rhinitis Father    Asthma Brother        as child   Cancer Maternal Aunt    Cancer Maternal Grandmother    Breast cancer Maternal Grandmother    Allergic rhinitis Paternal Grandmother    Allergic rhinitis Paternal Grandfather     Social History Social History   Tobacco Use   Smoking status: Former    Types: Cigarettes    Quit date: 09/30/2015    Years since quitting: 5.4   Smokeless tobacco: Never  Vaping Use   Vaping Use: Never used  Substance Use Topics   Alcohol use: No   Drug  use: No     Allergies   Augmentin [amoxicillin-pot clavulanate]   Review of Systems Review of Systems  Constitutional:  Negative for chills and fever.  Eyes:  Negative for discharge and redness.  Gastrointestinal:  Negative for abdominal pain, nausea and vomiting.  Genitourinary:  Positive for menstrual problem, vaginal bleeding and vaginal discharge.    Physical Exam Triage Vital Signs ED Triage Vitals [02/25/21 0854]  Enc Vitals Group     BP 114/78     Pulse Rate 79     Resp      Temp 98.1 F (36.7 C)     Temp Source Oral     SpO2 99 %     Weight 135 lb (61.2 kg)     Height 5\' 6"  (1.676 m)     Head Circumference      Peak Flow      Pain Score 0     Pain Loc      Pain Edu?      Excl. in GC?    No data found.  Updated Vital Signs BP 114/78 (BP Location: Left Arm)    Pulse 79    Temp 98.1 F (36.7 C) (Oral)    Ht 5\' 6"  (1.676 m)    Wt 135 lb (61.2 kg)    LMP 01/23/2021 (Exact Date)    SpO2 99%    BMI 21.79 kg/m       Physical Exam Vitals and nursing note reviewed.  Constitutional:      General: She is not in acute distress.    Appearance: Normal appearance. She is not ill-appearing.  HENT:     Head: Normocephalic and atraumatic.  Eyes:     Conjunctiva/sclera: Conjunctivae normal.  Cardiovascular:     Rate and Rhythm: Normal rate.  Pulmonary:     Effort: Pulmonary effort is normal.  Neurological:     Mental Status: She is alert.  Psychiatric:        Mood and Affect: Mood normal.        Behavior: Behavior normal.        Thought Content: Thought content normal.     UC Treatments / Results  Labs (all labs ordered are listed, but only abnormal results are displayed) Labs Reviewed  POCT URINE PREGNANCY - Abnormal; Notable for the following components:      Result Value   Preg Test, Ur Positive (*)    All other components within normal limits    EKG   Radiology No results found.  Procedures Procedures (including critical care time)  Medications Ordered in UC Medications - No data to display  Initial Impression / Assessment and Plan / UC Course  I have reviewed the triage vital signs and the nursing notes.  Pertinent labs & imaging results that were available during my care of the patient were reviewed by me and considered in my medical decision making (see chart for details).    Patient notified of positive pregnancy test in office.  She plans to follow-up with [facility] as this is where she had previous care.  Final Clinical Impressions(s) / UC Diagnoses   Final diagnoses:  Less than [redacted] weeks gestation of pregnancy   Discharge Instructions   None    ED Prescriptions   None    PDMP not reviewed this encounter.   13/02/2021, PA-C 02/25/21 [phone]

## 2021-03-03 ENCOUNTER — Inpatient Hospital Stay (HOSPITAL_COMMUNITY)
Admission: AD | Admit: 2021-03-03 | Discharge: 2021-03-03 | Disposition: A | Discharge status: Home / Self Care | Payer: Medicaid Other | Attending: Obstetrics and Gynecology | Admitting: Obstetrics and Gynecology

## 2021-03-03 ENCOUNTER — Other Ambulatory Visit: Payer: Self-pay

## 2021-03-03 ENCOUNTER — Encounter (HOSPITAL_COMMUNITY): Payer: Self-pay | Admitting: Obstetrics and Gynecology

## 2021-03-03 ENCOUNTER — Inpatient Hospital Stay (HOSPITAL_COMMUNITY): Payer: Medicaid Other

## 2021-03-03 DIAGNOSIS — Z87891 Personal history of nicotine dependence: Secondary | ICD-10-CM | POA: Insufficient documentation

## 2021-03-03 DIAGNOSIS — O26891 Other specified pregnancy related conditions, first trimester: Secondary | ICD-10-CM | POA: Insufficient documentation

## 2021-03-03 DIAGNOSIS — Z880 Allergy status to penicillin: Secondary | ICD-10-CM | POA: Diagnosis not present

## 2021-03-03 DIAGNOSIS — M545 Low back pain, unspecified: Secondary | ICD-10-CM | POA: Diagnosis present

## 2021-03-03 DIAGNOSIS — Z3490 Encounter for supervision of normal pregnancy, unspecified, unspecified trimester: Secondary | ICD-10-CM | POA: Diagnosis not present

## 2021-03-03 DIAGNOSIS — Z3A01 Less than 8 weeks gestation of pregnancy: Secondary | ICD-10-CM | POA: Insufficient documentation

## 2021-03-03 DIAGNOSIS — O3680X0 Pregnancy with inconclusive fetal viability, not applicable or unspecified: Secondary | ICD-10-CM | POA: Diagnosis not present

## 2021-03-03 LAB — CBC
HCT: 36.4 % (ref 36.0–46.0)
Hemoglobin: 11.9 g/dL — ABNORMAL LOW (ref 12.0–15.0)
MCH: 30.4 pg (ref 26.0–34.0)
MCHC: 32.7 g/dL (ref 30.0–36.0)
MCV: 92.9 fL (ref 80.0–100.0)
Platelets: 323 10*3/uL (ref 150–400)
RBC: 3.92 MIL/uL (ref 3.87–5.11)
RDW: 14 % (ref 11.5–15.5)
WBC: 9.1 10*3/uL (ref 4.0–10.5)
nRBC: 0 % (ref 0.0–0.2)

## 2021-03-03 LAB — COMPREHENSIVE METABOLIC PANEL
ALT: 15 U/L (ref 0–44)
AST: 15 U/L (ref 15–41)
Albumin: 3.8 g/dL (ref 3.5–5.0)
Alkaline Phosphatase: 43 U/L (ref 38–126)
Anion gap: 8 (ref 5–15)
BUN: 6 mg/dL (ref 6–20)
CO2: 24 mmol/L (ref 22–32)
Calcium: 8.9 mg/dL (ref 8.9–10.3)
Chloride: 103 mmol/L (ref 98–111)
Creatinine, Ser: 0.56 mg/dL (ref 0.44–1.00)
GFR, Estimated: >60 mL/min (ref >60)
Glucose, Bld: 89 mg/dL (ref 70–99)
Potassium: 3.3 mmol/L — ABNORMAL LOW (ref 3.5–5.1)
Sodium: 135 mmol/L (ref 135–145)
Total Bilirubin: 1.1 mg/dL (ref 0.3–1.2)
Total Protein: 7.4 g/dL (ref 6.5–8.1)

## 2021-03-03 LAB — WET PREP, GENITAL
Clue Cells Wet Prep HPF POC: NONE SEEN
Sperm: NONE SEEN
Trich, Wet Prep: NONE SEEN
WBC, Wet Prep HPF POC: NONE SEEN — AB (ref <10)
Yeast Wet Prep HPF POC: NONE SEEN

## 2021-03-03 LAB — HCG, QUANTITATIVE, PREGNANCY: hCG, Beta Chain, Quant, S: 9141 m[IU]/mL — ABNORMAL HIGH (ref <5)

## 2021-03-03 LAB — URINALYSIS, ROUTINE W REFLEX MICROSCOPIC
Bilirubin Urine: NEGATIVE
Glucose, UA: NEGATIVE mg/dL
Hgb urine dipstick: NEGATIVE
Ketones, ur: 5 mg/dL — AB
Leukocytes,Ua: NEGATIVE
Nitrite: NEGATIVE
Protein, ur: NEGATIVE mg/dL
Specific Gravity, Urine: 1.008 (ref 1.005–1.030)
pH: 6 (ref 5.0–8.0)

## 2021-03-03 NOTE — Discharge Instructions (Signed)

## 2021-03-03 NOTE — MAU Note (Signed)
Pt self swabbed

## 2021-03-03 NOTE — MAU Note (Signed)
Sharon Waller is a 25 y.o. at [redacted]w[redacted]d here in MAU reporting: lower back pain ongoing for a couple of weeks, pain is intermittent. No bleeding or discharge.   Pt has child with her, is attempting to find someone to come pick her. Pt to wait in lobby.  LMP: 01/23/21  Onset of complaint: ongoing  Pain score: 7/10  Vitals:   03/03/21 1457  BP: 118/75  Pulse: 72  Resp: 16  Temp: 98.4 F (36.9 C)  SpO2: 98%     Lab orders placed from triage: UA

## 2021-03-03 NOTE — MAU Provider Note (Signed)
History     CSN: [id number]  Arrival date and time: 03/03/21 1440   Event Date/Time   First Provider Initiated Contact with Patient 03/03/21 1709      Chief Complaint  Patient presents with   Back Pain   HPI Sharon Waller is a 25 y.o. G3P1001 at [redacted]w[redacted]d by LMP who presents to MAU with chief complaint of recurrent low back pain in the setting of positive home pregnancy test. Onset of symptom: two weeks ago. Pain waxes and wanes without intervention. Pain score is 7/10. Pain is located at her SI joint. She denies dysuria, flank pain, abdominal pain, vaginal bleeding, fever. She is remote from sexual intercourse.  OB History     Gravida  3   Para  1   Term  1   Preterm  0   AB  0   Living  1      SAB  0   IAB  0   Ectopic  0   Multiple  0   Live Births  1           Past Medical History:  Diagnosis Date   Abdominal pain affecting pregnancy, antepartum 09/26/2015   [redacted]w[redacted]d on 09/23/15    Allergic rhinitis 07/11/2018   Asthma    Bacterial vaginosis    Bronchitis    Choroid plexus cyst of fetus 01/07/2016   Mild intermittent asthma 07/11/2018   SVD (spontaneous vaginal delivery) 05/04/2016   UTI (lower urinary tract infection)     Past Surgical History:  Procedure Laterality Date   NO PAST SURGERIES      Family History  Problem Relation Age of Onset   Migraines Mother    Allergic rhinitis Father    Asthma Brother        as child   Cancer Maternal Aunt    Cancer Maternal Grandmother    Breast cancer Maternal Grandmother    Allergic rhinitis Paternal Grandmother    Allergic rhinitis Paternal Grandfather     Social History   Tobacco Use   Smoking status: Former    Types: Cigarettes    Quit date: 09/30/2015    Years since quitting: 5.4   Smokeless tobacco: Never  Vaping Use   Vaping Use: Never used  Substance Use Topics   Alcohol use: No   Drug use: No    Allergies:  Allergies  Allergen Reactions   Augmentin [Amoxicillin-Pot  Clavulanate] Rash    Has patient had a PCN reaction causing immediate rash, facial/tongue/throat swelling, SOB or lightheadedness with hypotension: Yes Has patient had a PCN reaction causing severe rash involving mucus membranes or skin necrosis: No Has patient had a PCN reaction that required hospitalization Yes Has patient had a PCN reaction occurring within the last 10 years: No If all of the above answers are "NO", then may proceed with Cephalosporin use.     Medications Prior to Admission  Medication Sig Dispense Refill Last Dose   adapalene (DIFFERIN) 0.1 % cream Apply topically at bedtime. For acne 90 g 1    metroNIDAZOLE (FLAGYL) 500 MG tablet Take 1 tablet (500 mg total) by mouth 2 (two) times daily. 14 tablet 0    SUMAtriptan (IMITREX) 25 MG tablet 25 mg (1 tablet total) by mouth (PO) at the start of the headache. May repeat in 2 hours x 1 if headache persists. Max of 2 tabs/24 hours 20 tablet 0    tobramycin (TOBREX) 0.3 % ophthalmic solution Place 1 drop into the  left eye every 4 (four) hours. (Patient not taking: Reported on 10/28/2020) 5 mL 0    triamcinolone (KENALOG) 0.1 % Apply 1 application topically 2 (two) times daily. For eczema 60 g 1     Review of Systems  Musculoskeletal:  Positive for back pain.  All other systems reviewed and are negative. Physical Exam   Blood pressure 118/75, pulse 72, temperature 98.4 F (36.9 C), temperature source Oral, resp. rate 16, height 5\' 6"  (1.676 m), weight 60.3 kg, last menstrual period 01/23/2021, SpO2 98 %.  Physical Exam Vitals and nursing note reviewed. Exam conducted with a chaperone present.  Constitutional:      Appearance: Normal appearance.  Cardiovascular:     Rate and Rhythm: Normal rate.     Pulses: Normal pulses.  Pulmonary:     Effort: Pulmonary effort is normal.  Abdominal:     General: Abdomen is flat.     Tenderness: There is no abdominal tenderness. There is no right CVA tenderness or left CVA tenderness.   Skin:    Capillary Refill: Capillary refill takes less than 2 seconds.  Neurological:     Mental Status: She is alert and oriented to person, place, and time.  Psychiatric:        Mood and Affect: Mood normal.        Behavior: Behavior normal.        Thought Content: Thought content normal.        Judgment: Judgment normal.    MAU Course/MDM  Procedures  Orders Placed This Encounter  Procedures   Wet prep, genital   13/02/2021 OB LESS THAN 14 WEEKS WITH OB TRANSVAGINAL   US OB Transvaginal   Urinalysis, Routine w reflex microscopic Urine, Clean Catch   CBC   hCG, quantitative, pregnancy   Comprehensive metabolic panel   Nursing communication   Discharge patient   Patient Vitals for the past 24 hrs:  BP Temp Temp src Pulse Resp SpO2 Height Weight  03/03/21 1457 118/75 98.4 F (36.9 C) Oral 72 16 98 % 5\' 6"  (1.676 m) 60.3 kg   Results for orders placed or performed during the hospital encounter of 03/03/21 (from the past 24 hour(s))  Urinalysis, Routine w reflex microscopic Urine, Clean Catch     Status: Abnormal   Collection Time: 03/03/21  3:08 PM  Result Value Ref Range   Color, Urine STRAW (A) YELLOW   APPearance CLEAR CLEAR   Specific Gravity, Urine 1.008 1.005 - 1.030   pH 6.0 5.0 - 8.0   Glucose, UA NEGATIVE NEGATIVE mg/dL   Hgb urine dipstick NEGATIVE NEGATIVE   Bilirubin Urine NEGATIVE NEGATIVE   Ketones, ur 5 (A) NEGATIVE mg/dL   Protein, ur NEGATIVE NEGATIVE mg/dL   Nitrite NEGATIVE NEGATIVE   Leukocytes,Ua NEGATIVE NEGATIVE  CBC     Status: Abnormal   Collection Time: 03/03/21  3:27 PM  Result Value Ref Range   WBC 9.1 4.0 - 10.5 K/uL   RBC 3.92 3.87 - 5.11 MIL/uL   Hemoglobin 11.9 (L) 12.0 - 15.0 g/dL   HCT 03/05/21 03/05/21 - 11.6 %   MCV 92.9 80.0 - 100.0 fL   MCH 30.4 26.0 - 34.0 pg   MCHC 32.7 30.0 - 36.0 g/dL   RDW 57.9 03.8 - 33.3 %   Platelets 323 150 - 400 K/uL   nRBC 0.0 0.0 - 0.2 %  hCG, quantitative, pregnancy     Status: Abnormal   Collection Time:  03/03/21  3:27 PM  Result Value Ref Range   hCG, Beta Chain, Quant, S 9,141 (H) <5 mIU/mL  Comprehensive metabolic panel     Status: Abnormal   Collection Time: 03/03/21  3:27 PM  Result Value Ref Range   Sodium 135 135 - 145 mmol/L   Potassium 3.3 (L) 3.5 - 5.1 mmol/L   Chloride 103 98 - 111 mmol/L   CO2 24 22 - 32 mmol/L   Glucose, Bld 89 70 - 99 mg/dL   BUN 6 6 - 20 mg/dL   Creatinine, Ser 4.14 0.44 - 1.00 mg/dL   Calcium 8.9 8.9 - 23.9 mg/dL   Total Protein 7.4 6.5 - 8.1 g/dL   Albumin 3.8 3.5 - 5.0 g/dL   AST 15 15 - 41 U/L   ALT 15 0 - 44 U/L   Alkaline Phosphatase 43 38 - 126 U/L   Total Bilirubin 1.1 0.3 - 1.2 mg/dL   GFR, Estimated >53 >20 mL/min   Anion gap 8 5 - 15  Wet prep, genital     Status: Abnormal   Collection Time: 03/03/21  4:03 PM   Specimen: PATH Cytology Cervicovaginal Ancillary Only  Result Value Ref Range   Yeast Wet Prep HPF POC NONE SEEN NONE SEEN   Trich, Wet Prep NONE SEEN NONE SEEN   Clue Cells Wet Prep HPF POC NONE SEEN NONE SEEN   WBC, Wet Prep HPF POC NONE SEEN (A) <10   Sperm NONE SEEN    US OB LESS THAN 14 WEEKS WITH OB TRANSVAGINAL  Result Date: 03/03/2021 CLINICAL DATA:  Low back pain. EXAM: OBSTETRIC <14 WK [place] AND TRANSVAGINAL OB US TECHNIQUE: Both transabdominal and transvaginal ultrasound examinations were performed for complete evaluation of the gestation as well as the maternal uterus, adnexal regions, and pelvic cul-de-sac. Transvaginal technique was performed to assess early pregnancy. COMPARISON:  None. FINDINGS: Intrauterine gestational sac: Single Yolk sac:  Visualized. Embryo:  Not Visualized. MSD: 9.2 mm mm   5 w   5 d Subchorionic hemorrhage:  None visualized. Maternal uterus/adnexae: Bilateral maternal ovaries are visualized. Corpus luteum is identified in the right ovary. IMPRESSION: 1. Single intrauterine gestational sac measuring 5 weeks 5 days by mean gestational sac diameter. Yolk sac is present, but no fetal pole identified  at this time which may be within normal limits for sac the size. Recommend short-term interval follow-up ultrasound to confirm viability. Electronically Signed   By: Darliss Cheney M.D.   On: 03/03/2021 17:05    Assessment and Plan  --25 y.o. G3P1001 with confirmed IUP --+ GS, + YS --Discharge home in stable condition with first trimester precautions  F/U: --Order placed for viability scan in two weeks --Patient has New OB intake scheduled with [facility] on 04/01/2021  Calvert Cantor, CNM 03/03/2021, 6:18 PM

## 2021-03-04 LAB — GC/CHLAMYDIA PROBE AMP (~~LOC~~) NOT AT ARMC
Chlamydia: NEGATIVE
Comment: NEGATIVE
Comment: NORMAL
Neisseria Gonorrhea: NEGATIVE

## 2021-03-04 NOTE — Telephone Encounter (Signed)
Created in error

## 2021-03-12 ENCOUNTER — Inpatient Hospital Stay (HOSPITAL_COMMUNITY)
Admission: AD | Admit: 2021-03-12 | Discharge: 2021-03-13 | Disposition: A | Discharge status: Home / Self Care | Payer: Medicaid Other | Source: Ambulatory Visit | Attending: Obstetrics and Gynecology | Admitting: Obstetrics and Gynecology

## 2021-03-12 ENCOUNTER — Other Ambulatory Visit: Payer: Self-pay

## 2021-03-12 DIAGNOSIS — Z3491 Encounter for supervision of normal pregnancy, unspecified, first trimester: Secondary | ICD-10-CM

## 2021-03-12 DIAGNOSIS — Z3A01 Less than 8 weeks gestation of pregnancy: Secondary | ICD-10-CM | POA: Insufficient documentation

## 2021-03-12 DIAGNOSIS — O210 Mild hyperemesis gravidarum: Secondary | ICD-10-CM | POA: Insufficient documentation

## 2021-03-12 DIAGNOSIS — M549 Dorsalgia, unspecified: Secondary | ICD-10-CM | POA: Insufficient documentation

## 2021-03-12 DIAGNOSIS — Z87891 Personal history of nicotine dependence: Secondary | ICD-10-CM | POA: Insufficient documentation

## 2021-03-12 DIAGNOSIS — O26891 Other specified pregnancy related conditions, first trimester: Secondary | ICD-10-CM | POA: Insufficient documentation

## 2021-03-12 NOTE — MAU Note (Addendum)
PT SAYS NAUSEA ALL WEEK.   VOMITED ALL DAY  BACK HURTS

## 2021-03-13 ENCOUNTER — Encounter (HOSPITAL_COMMUNITY): Payer: Self-pay | Admitting: Obstetrics and Gynecology

## 2021-03-13 DIAGNOSIS — O26891 Other specified pregnancy related conditions, first trimester: Secondary | ICD-10-CM | POA: Diagnosis present

## 2021-03-13 DIAGNOSIS — M549 Dorsalgia, unspecified: Secondary | ICD-10-CM | POA: Diagnosis not present

## 2021-03-13 DIAGNOSIS — O210 Mild hyperemesis gravidarum: Secondary | ICD-10-CM | POA: Diagnosis not present

## 2021-03-13 DIAGNOSIS — Z3A01 Less than 8 weeks gestation of pregnancy: Secondary | ICD-10-CM | POA: Diagnosis not present

## 2021-03-13 DIAGNOSIS — Z87891 Personal history of nicotine dependence: Secondary | ICD-10-CM | POA: Diagnosis not present

## 2021-03-13 LAB — URINALYSIS, ROUTINE W REFLEX MICROSCOPIC
Bilirubin Urine: NEGATIVE
Glucose, UA: NEGATIVE mg/dL
Hgb urine dipstick: NEGATIVE
Ketones, ur: NEGATIVE mg/dL
Leukocytes,Ua: NEGATIVE
Nitrite: NEGATIVE
Protein, ur: NEGATIVE mg/dL
Specific Gravity, Urine: 1.029 (ref 1.005–1.030)
pH: 5 (ref 5.0–8.0)

## 2021-03-13 MED ORDER — PROMETHAZINE HCL 25 MG PO TABS: 25.0000 mg | ORAL_TABLET | Freq: Four times a day (QID) | ORAL | 0 refills | Status: DC | PRN

## 2021-03-13 MED ORDER — PROMETHAZINE HCL 25 MG/ML IJ SOLN
25.0000 mg | Freq: Once | INTRAMUSCULAR | Status: DC
  Filled 2021-03-13: qty 1

## 2021-03-13 NOTE — MAU Provider Note (Signed)
History     CSN: [id number]  Arrival date and time: 03/12/21 2259   Event Date/Time   First Provider Initiated Contact with Patient 03/13/21 0502      Chief Complaint  Patient presents with   Nausea   Emesis   HPI [name] is a 25 y.o. year old G51P1011 female at [redacted]w[redacted]d weeks gestation who presents to MAU reporting nausea all week. She reports she has been "vomiting all day." She also reports back pain. She has not taken anything for nausea. She is scheduled to start [facility] with [facility]; first appt is 04/01/2021.  OB History     Gravida  3   Para  1   Term  1   Preterm  0   AB  1   Living  1      SAB  0   IAB  1   Ectopic  0   Multiple  0   Live Births  1           Past Medical History:  Diagnosis Date   Abdominal pain affecting pregnancy, antepartum 09/26/2015   [redacted]w[redacted]d on 09/23/15    Allergic rhinitis 07/11/2018   Asthma    Bacterial vaginosis    Bronchitis    Choroid plexus cyst of fetus 01/07/2016   Mild intermittent asthma 07/11/2018   SVD (spontaneous vaginal delivery) 05/04/2016   UTI (lower urinary tract infection)     Past Surgical History:  Procedure Laterality Date   NO PAST SURGERIES      Family History  Problem Relation Age of Onset   Migraines Mother    Allergic rhinitis Father    Asthma Brother        as child   Cancer Maternal Aunt    Cancer Maternal Grandmother    Breast cancer Maternal Grandmother    Allergic rhinitis Paternal Grandmother    Allergic rhinitis Paternal Grandfather     Social History   Tobacco Use   Smoking status: Former    Types: Cigarettes    Quit date: 09/30/2015    Years since quitting: 5.4   Smokeless tobacco: Never  Vaping Use   Vaping Use: Never used  Substance Use Topics   Alcohol use: No   Drug use: No    Allergies:  Allergies  Allergen Reactions   Augmentin [Amoxicillin-Pot Clavulanate] Rash    Has patient had a PCN reaction causing immediate rash, facial/tongue/throat swelling,  SOB or lightheadedness with hypotension: Yes Has patient had a PCN reaction causing severe rash involving mucus membranes or skin necrosis: No Has patient had a PCN reaction that required hospitalization Yes Has patient had a PCN reaction occurring within the last 10 years: No If all of the above answers are "NO", then may proceed with Cephalosporin use.     Medications Prior to Admission  Medication Sig Dispense Refill Last Dose   triamcinolone (KENALOG) 0.1 % Apply 1 application topically 2 (two) times daily. For eczema 60 g 1 Past Month   adapalene (DIFFERIN) 0.1 % cream Apply topically at bedtime. For acne 90 g 1    SUMAtriptan (IMITREX) 25 MG tablet 25 mg (1 tablet total) by mouth (PO) at the start of the headache. May repeat in 2 hours x 1 if headache persists. Max of 2 tabs/24 hours 20 tablet 0 More than a month   tobramycin (TOBREX) 0.3 % ophthalmic solution Place 1 drop into the left eye every 4 (four) hours. (Patient not taking: Reported on 10/28/2020)  5 mL 0 More than a month    Review of Systems  Constitutional: Negative.   HENT: Negative.    Eyes: Negative.   Respiratory: Negative.    Cardiovascular: Negative.   Gastrointestinal:  Positive for nausea and vomiting.  Endocrine: Negative.   Genitourinary: Negative.   Musculoskeletal: Negative.   Skin: Negative.   Allergic/Immunologic: Negative.   Neurological: Negative.   Hematological: Negative.   Psychiatric/Behavioral: Negative.    Physical Exam   Blood pressure 104/60, pulse 95, temperature 99.6 F (37.6 C), temperature source Oral, resp. rate 20, height 5\' 6"  (1.676 m), weight 60.1 kg, last menstrual period 01/23/2021.  Physical Exam Vitals and nursing note reviewed. Exam conducted with a chaperone present.  Constitutional:      Appearance: Normal appearance. She is normal weight.  Cardiovascular:     Rate and Rhythm: Normal rate.  Abdominal:     Palpations: Abdomen is soft.  Genitourinary:    Comments:  deferred Skin:    General: Skin is warm and dry.  Neurological:     Mental Status: She is alert and oriented to person, place, and time.  Psychiatric:        Mood and Affect: Mood normal.        Behavior: Behavior normal.        Thought Content: Thought content normal.        Judgment: Judgment normal.    MAU Course  Procedures  MDM CCUA Phenergan 25 mg IM injection -- declined because driving self home and has no one to pick her up  Results for orders placed or performed during the hospital encounter of 03/12/21 (from the past 24 hour(s))  Urinalysis, Routine w reflex microscopic Urine, Clean Catch     Status: Abnormal   Collection Time: 03/13/21  4:05 AM  Result Value Ref Range   Color, Urine YELLOW YELLOW   APPearance HAZY (A) CLEAR   Specific Gravity, Urine 1.029 1.005 - 1.030   pH 5.0 5.0 - 8.0   Glucose, UA NEGATIVE NEGATIVE mg/dL   Hgb urine dipstick NEGATIVE NEGATIVE   Bilirubin Urine NEGATIVE NEGATIVE   Ketones, ur NEGATIVE NEGATIVE mg/dL   Protein, ur NEGATIVE NEGATIVE mg/dL   Nitrite NEGATIVE NEGATIVE   Leukocytes,Ua NEGATIVE NEGATIVE    Assessment and Plan  Morning sickness  - Rx for Phenergan 25 mg po every 6 hours prn N/V sent  - Information provided on morning sickness   [redacted] weeks gestation of pregnancy   - Discharge patient - Keep scheduled appt with [facility] on 04/01/21 - Patient verbalized an understanding of the plan of care and agrees.    04/03/21, CNM 03/13/2021, 5:10 AM

## 2021-03-13 NOTE — Discharge Instructions (Signed)

## 2021-04-01 ENCOUNTER — Telehealth (INDEPENDENT_AMBULATORY_CARE_PROVIDER_SITE_OTHER): Payer: Medicaid Other

## 2021-04-01 DIAGNOSIS — Z3480 Encounter for supervision of other normal pregnancy, unspecified trimester: Secondary | ICD-10-CM

## 2021-04-01 DIAGNOSIS — Z3A00 Weeks of gestation of pregnancy not specified: Secondary | ICD-10-CM

## 2021-04-01 DIAGNOSIS — Z3491 Encounter for supervision of normal pregnancy, unspecified, first trimester: Secondary | ICD-10-CM | POA: Insufficient documentation

## 2021-04-01 MED ORDER — GOJJI WEIGHT SCALE MISC: 1.0000 | Freq: Once | 0 refills | Status: AC

## 2021-04-01 MED ORDER — BLOOD PRESSURE KIT DEVI: 1.0000 | Freq: Once | 0 refills | Status: AC

## 2021-04-01 NOTE — Progress Notes (Signed)
New OB Intake  I connected with  Sharon Waller on 04/02/21 at 1520 by MyChart Video Visit and verified that I am speaking with the correct person using two identifiers. Nurse is located at [facility] and pt is located at home.  I discussed the limitations, risks, security and privacy concerns of performing an evaluation and management service by telephone and the availability of in person appointments. I also discussed with the patient that there may be a patient responsible charge related to this service. The patient expressed understanding and agreed to proceed.  I explained I am completing New OB Intake today. We discussed her EDD of 10/30/21 that is based on LMP of 01/23/21. Pt is G1/P1011. I reviewed her allergies, medications, Medical/Surgical/OB history, and appropriate screenings. I informed her of [facility] services. Based on history, this is a low risk pregnancy  Patient Active Problem List   Diagnosis Date Noted   Supervision of low-risk pregnancy, first trimester 04/01/2021   Palpitations 07/17/2018   SOB (shortness of breath) 07/17/2018   Chest pain of uncertain etiology 07/17/2018   Mild intermittent asthma 07/11/2018   Allergic rhinitis 07/11/2018   Concerns addressed today None  Delivery Plans:  Plans to deliver at [facility].   MyChart/Babyscripts MyChart access verified. I explained pt will have some visits in office and some virtually. Babyscripts instructions given and order placed.  Blood Pressure Cuff  Blood pressure cuff ordered for patient to pick-up from [employer]. Explained after first prenatal appt pt will check weekly and document in Babyscripts.  Weight scale: Patient does not have weight scale. Weight scale ordered for patient to pick up from [employer].   Anatomy US Explained first scheduled [place] will be around 19 weeks. Anatomy US scheduled for 06/07/21 at 0930.  Labs Discussed Avelina Laine genetic screening with patient. Would like Panorama drawn, has  had maternal genetic testing prior. Routine prenatal labs needed.  COVID Vaccine Patient has had COVID vaccine.   CenteringPregnancy Candidate?  Yes - Patient would like to enroll in program. Bonita Quin, RN notified.  Mother/ Baby Dyad Candidate?    No  Social Determinants of Health Food Insecurity: Patient denies food insecurity. WIC Referral: Prenatal navigator will follow up. Transportation: Patient denies transportation needs. Childcare: Discussed no children allowed at ultrasound appointments. Offered childcare services; patient declines childcare services at this time.  First visit review I reviewed new OB appt with pt. I explained she will have a provider visit with OB labs. PAP smear is up to date. Explained pt will be seen by Camelia Eng, CNM at first visit; encounter routed to appropriate provider. Explained that patient will be seen by pregnancy navigator following visit with provider.  Marjo Bicker, RN 04/02/2021  10:43 AM

## 2021-04-07 ENCOUNTER — Encounter: Payer: Self-pay | Admitting: *Deleted

## 2021-04-15 ENCOUNTER — Ambulatory Visit (INDEPENDENT_AMBULATORY_CARE_PROVIDER_SITE_OTHER): Payer: Medicaid Other

## 2021-04-15 ENCOUNTER — Other Ambulatory Visit: Payer: Self-pay

## 2021-04-15 ENCOUNTER — Other Ambulatory Visit (HOSPITAL_COMMUNITY)
Admission: RE | Admit: 2021-04-15 | Discharge: 2021-04-15 | Disposition: A | Discharge status: Home / Self Care | Payer: Medicaid Other | Source: Ambulatory Visit

## 2021-04-15 VITALS — BP 99/65 | HR 77 | Wt 129.0 lb

## 2021-04-15 DIAGNOSIS — F439 Reaction to severe stress, unspecified: Secondary | ICD-10-CM | POA: Diagnosis not present

## 2021-04-15 DIAGNOSIS — O219 Vomiting of pregnancy, unspecified: Secondary | ICD-10-CM | POA: Diagnosis not present

## 2021-04-15 DIAGNOSIS — Z3491 Encounter for supervision of normal pregnancy, unspecified, first trimester: Secondary | ICD-10-CM

## 2021-04-15 MED ORDER — METOCLOPRAMIDE HCL 10 MG PO TABS: 10.0000 mg | ORAL_TABLET | Freq: Four times a day (QID) | ORAL | 3 refills | Status: DC

## 2021-04-15 NOTE — Progress Notes (Signed)
Patient would like a different Rx for nausea

## 2021-04-15 NOTE — Progress Notes (Signed)
Subjective:   Sharon Waller is a 26 y.o. G3P1011 at [redacted]w[redacted]d by LMP being seen today for her first obstetrical visit.  Her obstetrical history is significant for  none  and has Mild intermittent asthma; Allergic rhinitis; Palpitations; SOB (shortness of breath); Chest pain of uncertain etiology; and Supervision of low-risk pregnancy, first trimester on their problem list.. Patient does not intend to breast feed. Pregnancy history fully reviewed.  Patient reports no complaints.  HISTORY: OB History  Gravida Para Term Preterm AB Living  3 1 1  0 1 1  SAB IAB Ectopic Multiple Live Births  0 1 0 0 1    # Outcome Date GA Lbr Len/2nd Weight Sex Delivery Anes PTL Lv  3 Current           2 Term 05/04/16 [redacted]w[redacted]d 02:33 / 02:33 7 lb 4.1 oz (3.291 kg) F Vag-Spont EPI  LIV     Name: Bilton,GIRL Mayra     Apgar1: 9  Apgar5: 9  1 IAB            Past Medical History:  Diagnosis Date   Abdominal pain affecting pregnancy, antepartum 09/26/2015   [redacted]w[redacted]d on 09/23/15    Allergic rhinitis 07/11/2018   Asthma    Bacterial vaginosis    Bronchitis    Choroid plexus cyst of fetus 01/07/2016   Mild intermittent asthma 07/11/2018   SVD (spontaneous vaginal delivery) 05/04/2016   UTI (lower urinary tract infection)    Past Surgical History:  Procedure Laterality Date   NO PAST SURGERIES     Family History  Problem Relation Age of Onset   Migraines Mother    Allergic rhinitis Father    Asthma Brother        as child   Cancer Maternal Aunt    Breast cancer Maternal Aunt    Cancer Maternal Grandmother    Breast cancer Maternal Grandmother    Allergic rhinitis Paternal Grandmother    Allergic rhinitis Paternal Grandfather    Social History   Tobacco Use   Smoking status: Former    Types: Cigarettes    Quit date: 09/30/2015    Years since quitting: 5.5   Smokeless tobacco: Never  Vaping Use   Vaping Use: Never used  Substance Use Topics   Alcohol use: No   Drug use: No   Allergies   Allergen Reactions   Augmentin [Amoxicillin-Pot Clavulanate] Rash    Has patient had a PCN reaction causing immediate rash, facial/tongue/throat swelling, SOB or lightheadedness with hypotension: Yes Has patient had a PCN reaction causing severe rash involving mucus membranes or skin necrosis: No Has patient had a PCN reaction that required hospitalization Yes Has patient had a PCN reaction occurring within the last 10 years: No If all of the above answers are "NO", then may proceed with Cephalosporin use.    Current Outpatient Medications on File Prior to Visit  Medication Sig Dispense Refill   promethazine (PHENERGAN) 25 MG tablet Take 1 tablet (25 mg total) by mouth every 6 (six) hours as needed for nausea or vomiting. 120 tablet 0   adapalene (DIFFERIN) 0.1 % cream Apply topically at bedtime. For acne 90 g 1   SUMAtriptan (IMITREX) 25 MG tablet 25 mg (1 tablet total) by mouth (PO) at the start of the headache. May repeat in 2 hours x 1 if headache persists. Max of 2 tabs/24 hours 20 tablet 0   No current facility-administered medications on file prior to visit.  Indications for ASA therapy (per uptodate) One of the following: Previous pregnancy with preeclampsia, especially early onset and with an adverse outcome No Multifetal gestation No Chronic hypertension No Type 1 or 2 diabetes mellitus No Chronic kidney disease No Autoimmune disease (antiphospholipid syndrome, systemic lupus erythematosus) No  Two or more of the following: Nulliparity No Obesity (body mass index >30 kg/m2) No Family history of preeclampsia in mother or sister No Age ?35 years No Sociodemographic characteristics (African American race, low socioeconomic level) Yes Personal risk factors (eg, previous pregnancy with low birth weight or small for gestational age infant, previous adverse pregnancy outcome [eg, stillbirth], interval >10 years between pregnancies) No  Indications for early 1 hour GTT (per  uptodate)  BMI >25 (>23 in Asian women) AND one of the following  Gestational diabetes mellitus in a previous pregnancy No Glycated hemoglobin ?5.7 percent (39 mmol/mol), impaired glucose tolerance, or impaired fasting glucose on previous testing No First-degree relative with diabetes No High-risk race/ethnicity (eg, African American, Latino, Native American, [place] American, Pacific Islander) Yes History of cardiovascular disease No Hypertension or on therapy for hypertension No High-density lipoprotein cholesterol level <35 mg/dL (0.90 mmol/L) and/or a triglyceride level >250 mg/dL (2.82 mmol/L) No Polycystic ovary syndrome No Physical inactivity No Other clinical condition associated with insulin resistance (eg, severe obesity, acanthosis nigricans) No Previous birth of an infant weighing ?4000 g No Previous stillbirth of unknown cause No Exam   Vitals:   04/15/21 0954  BP: 99/65  Pulse: 77  Weight: 129 lb (58.5 kg)   Fetal Heart Rate (bpm): 161  Uterus:     Pelvic Exam: Perineum: deferred   Vulva: deferred   Vagina:  deferred   Cervix: deferred   Adnexa: deferred   Bony Pelvis: deferred  System: General: well-developed, well-nourished female in no acute distress   Breast:  normal appearance, no masses or tenderness   Skin: normal coloration and turgor, no rashes   Neurologic: oriented, normal, negative, normal mood   Extremities: normal strength, tone, and muscle mass, ROM of all joints is normal   HEENT PERRLA, extraocular movement intact and sclera clear, anicteric   Mouth/Teeth mucous membranes moist, pharynx normal without lesions and dental hygiene good   Neck supple and no masses   Cardiovascular: regular rate and rhythm   Respiratory:  no respiratory distress, normal breath sounds   Abdomen: soft, non-tender; bowel sounds normal; no masses,  no organomegaly     Assessment:   Pregnancy: [id number] Patient Active Problem List   Diagnosis Date Noted    Supervision of low-risk pregnancy, first trimester 04/01/2021   Palpitations 07/17/2018   SOB (shortness of breath) 07/17/2018   Chest pain of uncertain etiology [id number]   Mild intermittent asthma 07/11/2018   Allergic rhinitis 07/11/2018     Plan:  1. Supervision of low-risk pregnancy, first trimester - NOB. Doing well. No concerns - CenteringPregnancy patient   - Cervicovaginal ancillary only( [place]) - CBC/D/Plt+RPR+Rh+ABO+RubIgG... - Hemoglobin A1c - Culture, OB Urine - Genetic Screening  2. Nausea and vomiting during pregnancy - Stable. Requesting something else for nause d/t drowsiness with promethazine  - metoCLOPramide (REGLAN) 10 MG tablet; Take 1 tablet (10 mg total) by mouth 4 (four) times daily.  Dispense: 60 tablet; Refill: 3  3. Stress at home - Boyfriend recently in jail. Often feeling sad because he is no around. Has good support system at home, but interested in [employer] services. - Denies SI/HI.   - Ambulatory referral to Integrated Behavioral  Health   Initial labs drawn. Continue prenatal vitamins. Discussed and offered genetic screening options, including Quad screen/AFP, NIPS testing, and option to decline testing. Benefits/risks/alternatives reviewed. Pt aware that anatomy US is form of genetic screening with lower accuracy in detecting trisomies than blood work.  Pt chooses/declines genetic screening today. NIPS: requested. Ultrasound discussed; fetal anatomic survey: requested. Problem list reviewed and updated. The nature of [place] with multiple MDs and other Advanced Practice Providers was explained to patient; also emphasized that residents, students are part of our team. Routine obstetric precautions reviewed. No follow-ups on file.   Renee Harder, CNM 04/15/21 1:09 PM

## 2021-04-15 NOTE — Patient Instructions (Signed)
DOULA LIST   [facility]  Sierra Bizzell  [phone]  [email or web address]  [email or web address]  [facility] Doula Zula Price [phone]  [email or web address]   [facility]   Precious J. Bradley   [email or web address]   ??THE MOTHERLY DOULA?? Serenna Dawson   [phone]   [email or web address]     [facility]  Evelyn Tinsley  [phone]    [email or web address]   [facility]  Angie Rosier     [phone]     [email or web address]   Rachel McMillen: Doula & Photographer   Rachel McMillen [phone]       [email or web address]  [email or web address]   [facility]  Amelia Mattocks [phone]   [email or web address]   [facility]  Tiffany Slade  [phone]  [email or web address]   [email or web address]   [facility]   Iris Jones   [phone]  [email or web address]   Mary Walt [place] D[facility]a  Mary Walt  [phone] [email or web address]  [facility]  Jessica Bower           Sarah Carter         Christina Flaherty       Lora Reynolds     [phone] [email or web address]  [email or web address]   [facility]   Ciara Foxx [phone] [email or web address]   [facility]  Robin StJohn     [phone]  [email or web address]  Sole[facility] Doula  Jaden Millner     [phone]  [email or web address]  Facebook and IG [email or web address]   Bernadette Vereen  [phone] [email or web address]    Breanna Grant [phone] [email or web address]   Melissa Luck  [phone] [email or web address]     Madison Manson  [phone] [email or web address]   IG [email or web address]   Cierra Moore    [phone]  [email or web address]   Jerilyn "Jeri" Free  [phone] [email or web address]    Mtende Roll  [phone] [email or web address]   Susie Williams   [email or web address]    Liz Chavez    [phone] [email or web address]     Jessica Ayivi  [phone] [email or web address]    Zarmena Woods  [phone] [email or web address]    Shayla Rhem    [phone]   [facility] Joie Morrison  [phone] [email or web address]  Doula Mama Kathryn Farrar [phone] [email or web address]  [facility] Joie Morrison  [phone] [email or web address]  [facility]      Beth Ann Martin [phone]  [email or web address]  [email or web address]   ShawnTina Harris-Jones  [phone] [email or web address]   Sharyn Gietzen [phone] [email or web address]   Carlee Henry [phone] [email or web address]   Leatrice Priest  [phone] [email or web address]  [facility] [employer]  Terry Anderson  [phone] [email or web address]   Leslie King [phone] [email or web address]  MOOR Divine Myeka Dunn  [email or web address]   T-sheana Turner [phone] [email or web address]   Maya Jackson [phone] [email or web address]   Juante Randleman [phone] [email or web address]         

## 2021-04-16 LAB — CBC/D/PLT+RPR+RH+ABO+RUBIGG...
Antibody Screen: NEGATIVE
Basophils Absolute: 0 10*3/uL (ref 0.0–0.2)
Basos: 0 %
EOS (ABSOLUTE): 0 10*3/uL (ref 0.0–0.4)
Eos: 1 %
HCV Ab: <0.1 s/co ratio (ref 0.0–0.9)
HIV Screen 4th Generation wRfx: NONREACTIVE
Hematocrit: 37.6 % (ref 34.0–46.6)
Hemoglobin: 12.4 g/dL (ref 11.1–15.9)
Hepatitis B Surface Ag: NEGATIVE
Immature Grans (Abs): 0 10*3/uL (ref 0.0–0.1)
Immature Granulocytes: 0 %
Lymphocytes Absolute: 1.8 10*3/uL (ref 0.7–3.1)
Lymphs: 30 %
MCH: 30.3 pg (ref 26.6–33.0)
MCHC: 33 g/dL (ref 31.5–35.7)
MCV: 92 fL (ref 79–97)
Monocytes Absolute: 0.3 10*3/uL (ref 0.1–0.9)
Monocytes: 5 %
Neutrophils Absolute: 3.8 10*3/uL (ref 1.4–7.0)
Neutrophils: 64 %
Platelets: 299 10*3/uL (ref 150–450)
RBC: 4.09 x10E6/uL (ref 3.77–5.28)
RDW: 13.5 % (ref 11.7–15.4)
RPR Ser Ql: NONREACTIVE
Rh Factor: POSITIVE
Rubella Antibodies, IGG: 1.4 index (ref >0.99)
WBC: 5.9 10*3/uL (ref 3.4–10.8)

## 2021-04-16 LAB — HEMOGLOBIN A1C
Est. average glucose Bld gHb Est-mCnc: 105 mg/dL
Hgb A1c MFr Bld: 5.3 % (ref 4.8–5.6)

## 2021-04-16 LAB — CERVICOVAGINAL ANCILLARY ONLY
Chlamydia: NEGATIVE
Comment: NEGATIVE
Comment: NORMAL
Neisseria Gonorrhea: NEGATIVE

## 2021-04-16 LAB — HCV INTERPRETATION

## 2021-04-19 ENCOUNTER — Encounter: Payer: Self-pay | Admitting: *Deleted

## 2021-04-19 ENCOUNTER — Other Ambulatory Visit: Payer: Self-pay

## 2021-04-19 LAB — CULTURE, OB URINE

## 2021-04-19 LAB — URINE CULTURE, OB REFLEX

## 2021-04-19 NOTE — BH Specialist Note (Unsigned)
Integrated Behavioral Health via Telemedicine Visit  04/19/2021 ABRISH ERNY [id number]  Number of Integrated Behavioral Health visits: 1 Session Start time: 8:45***  Session End time: 9:45*** Total time: {IBH Total Time:21014050}  Referring Provider: *** Patient/Family location: Home*** [facility] Provider location: [facility]  All persons participating in visit: Patient *** and [facility] Aaliyan Brinkmeier ***  Types of Service: {CHL AMB TYPE OF SERVICE:[phone]}  I connected with Oriana I Waterfield and/or Trevor I Mccaig's {family members:20773} via  Telephone or Engineer, civil (consulting)  (Video is Surveyor, mining) and verified that I am speaking with the correct person using two identifiers. Discussed confidentiality: {YES/NO:21197}  I discussed the limitations of telemedicine and the availability of in person appointments.  Discussed there is a possibility of technology failure and discussed alternative modes of communication if that failure occurs.  I discussed that engaging in this telemedicine visit, they consent to the provision of behavioral healthcare and the services will be billed under their insurance.  Patient and/or legal guardian expressed understanding and consented to Telemedicine visit: {YES/NO:21197}  Presenting Concerns: Patient and/or family reports the following symptoms/concerns: *** Duration of problem: ***; Severity of problem: {Mild/Moderate/Severe:20260}  Patient and/or Family's Strengths/Protective Factors: {CHL AMB BH PROTECTIVE [phone]}  Goals Addressed: Patient will:  Reduce symptoms of: {IBH Symptoms:21014056}   Increase knowledge and/or ability of: {IBH Patient Tools:21014057}   Demonstrate ability to: {IBH Goals:21014053}  Progress towards Goals: {CHL AMB BH PROGRESS TOWARDS GOALS:[phone]}  Interventions: Interventions utilized:  {IBH  Interventions:21014054} Standardized Assessments completed: {IBH Screening Tools:21014051}  Patient and/or Family Response: ***  Assessment: Patient currently experiencing ***.   Patient may benefit from ***.  Plan: Follow up with behavioral health clinician on : *** Behavioral recommendations: *** Referral(s): {IBH Referrals:21014055}  I discussed the assessment and treatment plan with the patient and/or parent/guardian. They were provided an opportunity to ask questions and all were answered. They agreed with the plan and demonstrated an understanding of the instructions.   They were advised to call back or seek an in-person evaluation if the symptoms worsen or if the condition fails to improve as anticipated.  Valetta Close Luay Balding, LCSW

## 2021-04-20 ENCOUNTER — Other Ambulatory Visit: Payer: Self-pay

## 2021-04-20 ENCOUNTER — Ambulatory Visit
Admission: EM | Admit: 2021-04-20 | Discharge: 2021-04-20 | Disposition: A | Discharge status: Home / Self Care | Payer: Medicaid Other | Attending: Internal Medicine | Admitting: Internal Medicine

## 2021-04-20 ENCOUNTER — Encounter: Payer: Self-pay | Admitting: Emergency Medicine

## 2021-04-20 DIAGNOSIS — J069 Acute upper respiratory infection, unspecified: Secondary | ICD-10-CM

## 2021-04-20 MED ORDER — CETIRIZINE HCL 10 MG PO TABS: 10.0000 mg | ORAL_TABLET | Freq: Every day | ORAL | 0 refills | Status: DC

## 2021-04-20 MED ORDER — FLUTICASONE PROPIONATE 50 MCG/ACT NA SUSP: 1.0000 | Freq: Every day | NASAL | 0 refills | Status: DC

## 2021-04-20 NOTE — ED Provider Notes (Signed)
[facility]    CSN: [id number] Arrival date & time: 04/20/21  0836      History   Chief Complaint Chief Complaint  Patient presents with   Nasal Congestion         HPI Sharon Waller is a 26 y.o. female.   Patient presents with a 5 to 7-day history of nasal congestion and nasal drainage.  Patient reports a mild nonproductive cough at times as well.  Denies any known fevers but does report that she has had chills and body aches.  Patient has not taken any medications because she is currently [redacted] weeks pregnant and was not sure what to take.  Denies chest pain, shortness of breath, sore throat, ear pain, nausea, vomiting, diarrhea, abdominal pain.  Denies any known sick contacts.    Past Medical History:  Diagnosis Date   Abdominal pain affecting pregnancy, antepartum 09/26/2015   [redacted]w[redacted]d on 09/23/15    Allergic rhinitis 07/11/2018   Asthma    Bacterial vaginosis    Bronchitis    Choroid plexus cyst of fetus 01/07/2016   Mild intermittent asthma 07/11/2018   SVD (spontaneous vaginal delivery) 05/04/2016   UTI (lower urinary tract infection)     Patient Active Problem List   Diagnosis Date Noted   Supervision of low-risk pregnancy, first trimester 04/01/2021   Palpitations 07/17/2018   SOB (shortness of breath) 07/17/2018   Chest pain of uncertain etiology [id number]   Mild intermittent asthma 07/11/2018   Allergic rhinitis 07/11/2018    Past Surgical History:  Procedure Laterality Date   NO PAST SURGERIES      OB History     Gravida  3   Para  1   Term  1   Preterm  0   AB  1   Living  1      SAB  0   IAB  1   Ectopic  0   Multiple  0   Live Births  1            Home Medications    Prior to Admission medications   Medication Sig Start Date End Date Taking? Authorizing Provider  cetirizine (ZYRTEC) 10 MG tablet Take 1 tablet (10 mg total) by mouth daily for 10 days. 04/20/21 04/30/21 Yes Shyia Fillingim, Michele Rockers, FNP  fluticasone  (FLONASE) 50 MCG/ACT nasal spray Place 1 spray into both nostrils daily for 3 days. 04/20/21 04/23/21 Yes Lorette Peterkin, Hildred Alamin E, FNP  adapalene (DIFFERIN) 0.1 % cream Apply topically at bedtime. For acne 06/15/20   Gildardo Pounds, NP  metoCLOPramide (REGLAN) 10 MG tablet Take 1 tablet (10 mg total) by mouth 4 (four) times daily. 04/15/21   Renee Harder, CNM  promethazine (PHENERGAN) 25 MG tablet Take 1 tablet (25 mg total) by mouth every 6 (six) hours as needed for nausea or vomiting. 03/13/21   Laury Deep, CNM  SUMAtriptan (IMITREX) 25 MG tablet 25 mg (1 tablet total) by mouth (PO) at the start of the headache. May repeat in 2 hours x 1 if headache persists. Max of 2 tabs/24 hours 08/07/20   Camillia Herter, NP    Family History Family History  Problem Relation Age of Onset   Migraines Mother    Allergic rhinitis Father    Asthma Brother        as child   Cancer Maternal Aunt    Breast cancer Maternal Aunt    Cancer Maternal Grandmother    Breast cancer Maternal  Grandmother    Allergic rhinitis Paternal Grandmother    Allergic rhinitis Paternal Grandfather     Social History Social History   Tobacco Use   Smoking status: Former    Types: Cigarettes    Quit date: 09/30/2015    Years since quitting: 5.5   Smokeless tobacco: Never  Vaping Use   Vaping Use: Never used  Substance Use Topics   Alcohol use: No   Drug use: No     Allergies   Augmentin [amoxicillin-pot clavulanate]   Review of Systems Review of Systems Per HPI  Physical Exam Triage Vital Signs ED Triage Vitals  Enc Vitals Group     BP 04/20/21 0906 98/72     Pulse Rate 04/20/21 0906 78     Resp 04/20/21 0906 18     Temp 04/20/21 0906 98.1 F (36.7 C)     Temp Source 04/20/21 0906 Oral     SpO2 04/20/21 0906 97 %     Weight --      Height --      Head Circumference --      Peak Flow --      Pain Score 04/20/21 0907 3     Pain Loc --      Pain Edu? --      Excl. in [place]? --    No data  found.  Updated Vital Signs BP 98/72 (BP Location: Left Arm)    Pulse 78    Temp 98.1 F (36.7 C) (Oral)    Resp 18    LMP 01/23/2021    SpO2 97%   Visual Acuity Right Eye Distance:   Left Eye Distance:   Bilateral Distance:    Right Eye Near:   Left Eye Near:    Bilateral Near:     Physical Exam Constitutional:      General: She is not in acute distress.    Appearance: Normal appearance. She is not toxic-appearing or diaphoretic.  HENT:     Head: Normocephalic and atraumatic.     Right Ear: Tympanic membrane and ear canal normal.     Left Ear: Tympanic membrane and ear canal normal.     Nose: Congestion present.     Mouth/Throat:     Mouth: Mucous membranes are moist.     Pharynx: No posterior oropharyngeal erythema.  Eyes:     Extraocular Movements: Extraocular movements intact.     Conjunctiva/sclera: Conjunctivae normal.     Pupils: Pupils are equal, round, and reactive to light.  Cardiovascular:     Rate and Rhythm: Normal rate and regular rhythm.     Pulses: Normal pulses.     Heart sounds: Normal heart sounds.  Pulmonary:     Effort: Pulmonary effort is normal. No respiratory distress.     Breath sounds: Normal breath sounds. No stridor. No wheezing, rhonchi or rales.  Abdominal:     General: Abdomen is flat. Bowel sounds are normal.     Palpations: Abdomen is soft.  Musculoskeletal:        General: Normal range of motion.     Cervical back: Normal range of motion.  Skin:    General: Skin is warm and dry.  Neurological:     General: No focal deficit present.     Mental Status: She is alert and oriented to person, place, and time. Mental status is at baseline.  Psychiatric:        Mood and Affect: Mood normal.  Behavior: Behavior normal.     UC Treatments / Results  Labs (all labs ordered are listed, but only abnormal results are displayed) Labs Reviewed  COVID-19, FLU A+B NAA    EKG   Radiology No results found.  Procedures Procedures  (including critical care time)  Medications Ordered in UC Medications - No data to display  Initial Impression / Assessment and Plan / UC Course  I have reviewed the triage vital signs and the nursing notes.  Pertinent labs & imaging results that were available during my care of the patient were reviewed by me and considered in my medical decision making (see chart for details).     Patient presents with symptoms likely from a viral upper respiratory infection. Differential includes bacterial pneumonia, sinusitis, allergic rhinitis, COVID-19, flu. Do not suspect underlying cardiopulmonary process. Symptoms seem unlikely related to ACS, CHF or COPD exacerbations, pneumonia, pneumothorax. Patient is nontoxic appearing and not in need of emergent medical intervention.  COVID-19 and flu test pending.  Low suspicion for bronchitis or pneumonia.  Recommended symptom control with over the counter medications that are safe with pregnancy.  Cetirizine and Flonase prescribed for patient.  Advised patient to follow-up with OB/GYN for further recommendations for safe medications to take while pregnant.  Return if symptoms fail to improve in 1-2 weeks or you develop shortness of breath, chest pain, severe headache. Patient states understanding and is agreeable.  Discharged with PCP followup.  Final Clinical Impressions(s) / UC Diagnoses   Final diagnoses:  Viral upper respiratory tract infection with cough     Discharge Instructions      It appears that you have a viral upper respiratory infection.  You have been prescribed 2 medications to alleviate symptoms that are safe with pregnancy.  Please follow-up with OB/GYN for further recommendations on safe medications to take during your pregnancy.  COVID and flu test is pending.    ED Prescriptions     Medication Sig Dispense Auth. Provider   cetirizine (ZYRTEC) 10 MG tablet Take 1 tablet (10 mg total) by mouth daily for 10 days. 30 tablet  Rosita, [place] E, [place]   fluticasone [facility]) 50 MCG/ACT nasal spray Place 1 spray into both nostrils daily for 3 days. 16 g Teodora Medici, [place]      PDMP not reviewed this encounter.   Teodora Medici, [place] 04/20/21 [phone]

## 2021-04-20 NOTE — Discharge Instructions (Signed)
It appears that you have a viral upper respiratory infection.  You have been prescribed 2 medications to alleviate symptoms that are safe with pregnancy.  Please follow-up with OB/GYN for further recommendations on safe medications to take during your pregnancy.  COVID and flu test is pending.

## 2021-04-20 NOTE — ED Triage Notes (Signed)
Pt sts congestion and nasal drainage with some chills x 5 days; pt sts currently [redacted] weeks pregnant

## 2021-04-21 ENCOUNTER — Telehealth: Payer: Self-pay

## 2021-04-21 ENCOUNTER — Other Ambulatory Visit: Payer: Self-pay

## 2021-04-21 ENCOUNTER — Ambulatory Visit
Admission: EM | Admit: 2021-04-21 | Discharge: 2021-04-21 | Disposition: A | Discharge status: Home / Self Care | Payer: Medicaid Other | Attending: Urgent Care | Admitting: Urgent Care

## 2021-04-21 DIAGNOSIS — R490 Dysphonia: Secondary | ICD-10-CM

## 2021-04-21 DIAGNOSIS — J309 Allergic rhinitis, unspecified: Secondary | ICD-10-CM

## 2021-04-21 DIAGNOSIS — J018 Other acute sinusitis: Secondary | ICD-10-CM

## 2021-04-21 DIAGNOSIS — R058 Other specified cough: Secondary | ICD-10-CM

## 2021-04-21 DIAGNOSIS — J4530 Mild persistent asthma, uncomplicated: Secondary | ICD-10-CM | POA: Diagnosis not present

## 2021-04-21 LAB — COVID-19, FLU A+B NAA
Influenza A, NAA: NOT DETECTED
Influenza B, NAA: NOT DETECTED
SARS-CoV-2, NAA: DETECTED — AB

## 2021-04-21 MED ORDER — PREDNISONE 20 MG PO TABS: 40.0000 mg | ORAL_TABLET | Freq: Every day | ORAL | 0 refills | Status: DC

## 2021-04-21 MED ORDER — DOXYCYCLINE HYCLATE 100 MG PO CAPS: 100.0000 mg | ORAL_CAPSULE | Freq: Two times a day (BID) | ORAL | 0 refills | Status: DC

## 2021-04-21 NOTE — ED Provider Notes (Signed)
[facility]   MRN: [id number] DOB: 02-21-1996  Subjective:   Sharon Waller is a 26 y.o. female presenting for 2-week history of persistent and worsening throat pain, sinus congestion, now having epistaxis.  She is also lost her voice.  Has been having a persistent cough.  Has a history of allergic rhinitis and started to use Zyrtec and Flonase but is not helping.  She also has a history of asthma, has an albuterol inhaler that she uses as needed.  No chest pain, shortness of breath or wheezing.  She was seen yesterday, had a COVID and flu test done which is still pending.  No current facility-administered medications for this encounter.  Current Outpatient Medications:    doxycycline (VIBRAMYCIN) 100 MG capsule, Take 1 capsule (100 mg total) by mouth 2 (two) times daily., Disp: 14 capsule, Rfl: 0   predniSONE (DELTASONE) 20 MG tablet, Take 2 tablets (40 mg total) by mouth daily with breakfast., Disp: 10 tablet, Rfl: 0   adapalene (DIFFERIN) 0.1 % cream, Apply topically at bedtime. For acne, Disp: 90 g, Rfl: 1   cetirizine (ZYRTEC) 10 MG tablet, Take 1 tablet (10 mg total) by mouth daily for 10 days., Disp: 30 tablet, Rfl: 0   fluticasone (FLONASE) 50 MCG/ACT nasal spray, Place 1 spray into both nostrils daily for 3 days., Disp: 16 g, Rfl: 0   metoCLOPramide (REGLAN) 10 MG tablet, Take 1 tablet (10 mg total) by mouth 4 (four) times daily., Disp: 60 tablet, Rfl: 3   promethazine (PHENERGAN) 25 MG tablet, Take 1 tablet (25 mg total) by mouth every 6 (six) hours as needed for nausea or vomiting., Disp: 120 tablet, Rfl: 0   SUMAtriptan (IMITREX) 25 MG tablet, 25 mg (1 tablet total) by mouth (PO) at the start of the headache. May repeat in 2 hours x 1 if headache persists. Max of 2 tabs/24 hours, Disp: 20 tablet, Rfl: 0   Allergies  Allergen Reactions   Augmentin [Amoxicillin-Pot Clavulanate] Rash    Has patient had a PCN reaction causing immediate rash, facial/tongue/throat  swelling, SOB or lightheadedness with hypotension: Yes Has patient had a PCN reaction causing severe rash involving mucus membranes or skin necrosis: No Has patient had a PCN reaction that required hospitalization Yes Has patient had a PCN reaction occurring within the last 10 years: No If all of the above answers are "NO", then may proceed with Cephalosporin use.     Past Medical History:  Diagnosis Date   Abdominal pain affecting pregnancy, antepartum 09/26/2015   [redacted]w[redacted]d on 09/23/15    Allergic rhinitis 07/11/2018   Asthma    Bacterial vaginosis    Bronchitis    Choroid plexus cyst of fetus 01/07/2016   Mild intermittent asthma 07/11/2018   SVD (spontaneous vaginal delivery) 05/04/2016   UTI (lower urinary tract infection)      Past Surgical History:  Procedure Laterality Date   NO PAST SURGERIES      Family History  Problem Relation Age of Onset   Migraines Mother    Allergic rhinitis Father    Asthma Brother        as child   Cancer Maternal Aunt    Breast cancer Maternal Aunt    Cancer Maternal Grandmother    Breast cancer Maternal Grandmother    Allergic rhinitis Paternal Grandmother    Allergic rhinitis Paternal Grandfather     Social History   Tobacco Use   Smoking status: Former    Types: Cigarettes  Quit date: 09/30/2015    Years since quitting: 5.5   Smokeless tobacco: Never  Vaping Use   Vaping Use: Never used  Substance Use Topics   Alcohol use: No   Drug use: No    ROS   Objective:   Vitals: BP 99/68 (BP Location: Left Arm)    Pulse 86    Temp 97.9 F (36.6 C) (Oral)    Resp 18    LMP 01/23/2021    SpO2 98%   Physical Exam Constitutional:      General: She is not in acute distress.    Appearance: Normal appearance. She is well-developed and normal weight. She is not ill-appearing, toxic-appearing or diaphoretic.  HENT:     Head: Normocephalic and atraumatic.     Right Ear: Tympanic membrane, ear canal and external ear normal. No drainage  or tenderness. No middle ear effusion. There is no impacted cerumen. Tympanic membrane is not erythematous.     Left Ear: Tympanic membrane, ear canal and external ear normal. No drainage or tenderness.  No middle ear effusion. There is no impacted cerumen. Tympanic membrane is not erythematous.     Nose: Congestion and rhinorrhea present.     Mouth/Throat:     Mouth: Mucous membranes are moist. No oral lesions.     Pharynx: No pharyngeal swelling, oropharyngeal exudate, posterior oropharyngeal erythema or uvula swelling.     Tonsils: No tonsillar exudate or tonsillar abscesses.     Comments: Thick streaks of postnasal drainage overlying the pharynx. Eyes:     General: No scleral icterus.       Right eye: No discharge.        Left eye: No discharge.     Extraocular Movements: Extraocular movements intact.     Right eye: Normal extraocular motion.     Left eye: Normal extraocular motion.     Conjunctiva/sclera: Conjunctivae normal.  Cardiovascular:     Rate and Rhythm: Normal rate.     Heart sounds: No murmur heard.   No friction rub. No gallop.  Pulmonary:     Effort: Pulmonary effort is normal. No respiratory distress.     Breath sounds: No stridor. No wheezing, rhonchi or rales.  Chest:     Chest wall: No tenderness.  Musculoskeletal:     Cervical back: Normal range of motion and neck supple.  Lymphadenopathy:     Cervical: No cervical adenopathy.  Skin:    General: Skin is warm and dry.  Neurological:     General: No focal deficit present.     Mental Status: She is alert and oriented to person, place, and time.  Psychiatric:        Mood and Affect: Mood normal.        Behavior: Behavior normal.    Assessment and Plan :   PDMP not reviewed this encounter.  1. Acute non-recurrent sinusitis of other sinus   2. Allergic rhinitis, unspecified seasonality, unspecified trigger   3. Mild persistent asthma without complication   4. Hoarseness of voice   5. Dry cough     Deferred imaging given clear cardiopulmonary exam, hemodynamically stable vital signs. Does not meet [facility] criteria for strep testing.  Will start empiric treatment for sinusitis with doxycycline.  Recommended supportive care otherwise including the continued use of oral antihistamine.  However, due to the epistaxis that she has noticed at home, we will have her hold off on Flonase.  This should be done anyhow as we are addressing the  sinusitis.  Furthermore in the context of her allergic rhinitis and asthma, hoarseness and loss of her voice will be using an oral prednisone course.  Counseled patient on potential for adverse effects with medications prescribed/recommended today, ER and return-to-clinic precautions discussed, patient verbalized understanding.    Wallis Bamberg, PA-C 04/21/21 1122

## 2021-04-21 NOTE — Telephone Encounter (Signed)
Called pt and stated to the pt that we are so excited you are beginning [facility]renatal [facility]!  *As a reminder your appointment is 04/26/21 at 0900. You do not have to wait in the lobby. Just come on into the Centering room which is just past the Registrar desk to the left. You can ask the greeter to help you find it. *We plan to start on time at 0900 and finish on time at 1100. *Due to Covid restrictions you cannot bring anyone with you into the Centering room for now, we hope that changes soon.  We do encourage you to complete echeck in on your MyChart before your appointments. This will leave more time for our group sessions.  Pt encouraged to contact the office with questions.   Frances Nickels  04/21/21

## 2021-04-21 NOTE — ED Triage Notes (Signed)
Pt states here yesterday for sore throat. States her throat feels inflamed and has lost her voice. States using the nasal spray that was prescribed.

## 2021-04-23 ENCOUNTER — Other Ambulatory Visit: Payer: Self-pay

## 2021-04-23 DIAGNOSIS — O2342 Unspecified infection of urinary tract in pregnancy, second trimester: Secondary | ICD-10-CM

## 2021-04-23 MED ORDER — NITROFURANTOIN MONOHYD MACRO 100 MG PO CAPS: 100.0000 mg | ORAL_CAPSULE | Freq: Two times a day (BID) | ORAL | 0 refills | Status: DC

## 2021-04-25 NOTE — Progress Notes (Deleted)
° ° ° ° ° °  PRENATAL VISIT NOTE- [facility] Pregnancy Cycle 2, Session # 1  Subjective:  Sharon Waller is a 26 y.o. G3P1011 at [redacted]w[redacted]d being seen today for ongoing prenatal care through [facility].  She is currently monitored for the following issues for this low-risk pregnancy and has Mild intermittent asthma; Allergic rhinitis; Palpitations; SOB (shortness of breath); Chest pain of uncertain etiology; and Supervision of low-risk pregnancy, first trimester on their problem list.  Patient reports {sx:[id number]}.   .  .   . ***Denies leaking of fluid/ROM.   The following portions of the patient's history were reviewed and updated as appropriate: allergies, current medications, past family history, past medical history, past social history, past surgical history and problem list. Problem list updated.  Objective:  There were no vitals filed for this visit.  Fetal Status:           General:  Alert, oriented and cooperative. Patient is in no acute distress.  Skin: Skin is warm and dry. No rash noted.   Cardiovascular: Normal heart rate noted  Respiratory: Normal respiratory effort, no problems with respiration noted  Abdomen: Soft, gravid, appropriate for gestational age.        Pelvic: {Blank single:19197::"Cervical exam performed","Cervical exam deferred"}        Extremities: Normal range of motion.     Mental Status: Normal mood and affect. Normal behavior. Normal judgment and thought content.   Assessment and Plan:  Pregnancy: G3P1011 at [redacted]w[redacted]d  1. Supervision of low-risk pregnancy, first trimester   [facility] Pregnancy, Session#1: Introduction to model of care. Group determined rules for self-governance and closing phrase. Oriented group to space and mother's notebook.   Facilitated discussion today:  common discomforts, When to call practice  Mindfulness activity completed as well as introduction to deep breathing for childbirth preparation- [facility] 3 Breaths  Fundal  height and FHR appropriate today unless noted otherwise in plan of care. Patient to continue group care.   --Pregnancy 12-16 weeks: NIPS: pending Horizon: pending AFP: Needs--schedule with [place] Ultrasound: scheduled 06/07/21  2. Nausea and vomiting during pregnancy ***  3. COVID-19 affecting pregnancy in first trimester --Positive with symptoms on 04/20/21      {Blank single:19197::"Term","Preterm"} labor symptoms and general obstetric precautions including but not limited to vaginal bleeding, contractions, leaking of fluid and fetal movement were reviewed in detail with the patient. Please refer to After Visit Summary for other counseling recommendations.  No follow-ups on file.  Future Appointments  Date Time Provider Department Center  04/26/2021  9:00 AM CENTERING PROVIDER [facility]  05/03/2021  9:15 AM [facility]  05/24/2021  9:00 AM CENTERING PROVIDER [facility]  06/07/2021  9:45 AM [facility] US4 [facility]  06/21/2021  9:00 AM CENTERING PROVIDER [facility]  07/19/2021  9:00 AM CENTERING PROVIDER [facility]  08/02/2021  9:00 AM CENTERING PROVIDER [facility]  08/16/2021  9:00 AM CENTERING PROVIDER [facility]  08/30/2021  9:00 AM CENTERING PROVIDER [facility]  09/13/2021  9:00 AM CENTERING PROVIDER [facility]  09/27/2021  9:00 AM CENTERING PROVIDER [facility]  10/11/2021  9:00 AM CENTERING PROVIDER [facility]    Sharen Counter, CNM

## 2021-04-26 ENCOUNTER — Encounter: Payer: Medicaid Other | Admitting: Advanced Practice Midwife

## 2021-04-26 ENCOUNTER — Telehealth: Payer: Self-pay | Admitting: Advanced Practice Midwife

## 2021-04-26 DIAGNOSIS — U071 COVID-19: Secondary | ICD-10-CM

## 2021-04-26 DIAGNOSIS — O219 Vomiting of pregnancy, unspecified: Secondary | ICD-10-CM

## 2021-04-26 DIAGNOSIS — Z3491 Encounter for supervision of normal pregnancy, unspecified, first trimester: Secondary | ICD-10-CM

## 2021-04-28 NOTE — Telephone Encounter (Signed)
[id number] at [redacted]w[redacted]d is scheduled for [facility] Pregnancy group 2 this morning on 04/26/21 and is noted to be COVID positive on 04/20/21.  I called pt this morning to see how she is feeling. 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  She reports she was not planning to come to Cent[facility] this morning, however, with her recent COVID.  I agreed that this agrees with our office policy of 10 days before returning and that a group setting might be higher risk.   Pt will be scheduled an additional prenatal visit in traditional care at [facility] before her next scheduled Centering session on 05/24/21.  Pt states understanding and is looking forward to rejoining the Centering group.

## 2021-05-04 ENCOUNTER — Encounter: Payer: Self-pay | Admitting: Family Medicine

## 2021-05-04 ENCOUNTER — Telehealth: Payer: Self-pay | Admitting: Lactation Services

## 2021-05-04 DIAGNOSIS — D563 Thalassemia minor: Secondary | ICD-10-CM | POA: Insufficient documentation

## 2021-05-04 NOTE — Telephone Encounter (Signed)
Called patient to inform her that [facility] Carrier Screening shows she is a silent carrier for [employer] and at increased carrier risk for SMA.   Recommended patient call [employer] at [phone] to discuss results and recommenedations.   Recommended father of the baby also be tested to see if he carrier the same gene.   Patient reports she read the report yesterday and has no questions or concerns at this time.

## 2021-05-05 ENCOUNTER — Encounter: Payer: Medicaid Other | Admitting: Obstetrics & Gynecology

## 2021-05-05 ENCOUNTER — Telehealth: Payer: Self-pay

## 2021-05-05 NOTE — Telephone Encounter (Signed)
Called pt on both contact numbers provided and received message that "voicemail box has not been set up yet."  MyChart message sent.   Sharon Waller  05/05/21

## 2021-06-07 ENCOUNTER — Ambulatory Visit: Payer: Medicaid Other

## 2021-06-09 ENCOUNTER — Ambulatory Visit: Payer: Self-pay

## 2021-06-10 ENCOUNTER — Ambulatory Visit: Payer: Self-pay

## 2021-06-16 ENCOUNTER — Ambulatory Visit
Admission: RE | Admit: 2021-06-16 | Discharge: 2021-06-16 | Disposition: A | Discharge status: Home / Self Care | Payer: Medicaid Other | Source: Ambulatory Visit | Attending: Emergency Medicine | Admitting: Emergency Medicine

## 2021-06-16 VITALS — BP 114/76 | HR 54 | Temp 98.5°F | Resp 18

## 2021-06-16 DIAGNOSIS — J302 Other seasonal allergic rhinitis: Secondary | ICD-10-CM | POA: Insufficient documentation

## 2021-06-16 DIAGNOSIS — N760 Acute vaginitis: Secondary | ICD-10-CM | POA: Diagnosis not present

## 2021-06-16 DIAGNOSIS — N898 Other specified noninflammatory disorders of vagina: Secondary | ICD-10-CM | POA: Insufficient documentation

## 2021-06-16 DIAGNOSIS — R350 Frequency of micturition: Secondary | ICD-10-CM | POA: Diagnosis not present

## 2021-06-16 LAB — POCT URINALYSIS DIP (MANUAL ENTRY)
Bilirubin, UA: NEGATIVE
Glucose, UA: NEGATIVE mg/dL
Ketones, POC UA: NEGATIVE mg/dL
Nitrite, UA: NEGATIVE
Protein Ur, POC: NEGATIVE mg/dL
Spec Grav, UA: 1.015 (ref 1.010–1.025)
Urobilinogen, UA: 0.2 E.U./dL
pH, UA: 7 (ref 5.0–8.0)

## 2021-06-16 LAB — POCT URINE PREGNANCY: Preg Test, Ur: NEGATIVE

## 2021-06-16 MED ORDER — FLUTICASONE PROPIONATE 50 MCG/ACT NA SUSP: 1.0000 | Freq: Every day | NASAL | 1 refills | Status: DC

## 2021-06-16 MED ORDER — METRONIDAZOLE 500 MG PO TABS: 500.0000 mg | ORAL_TABLET | Freq: Two times a day (BID) | ORAL | 0 refills | Status: DC

## 2021-06-16 MED ORDER — IPRATROPIUM BROMIDE 0.06 % NA SOLN
2.0000 | Freq: Four times a day (QID) | NASAL | 0 refills | Status: DC
Start: 2021-06-16 — End: 2021-10-31

## 2021-06-16 MED ORDER — CETIRIZINE HCL 10 MG PO TABS: 10.0000 mg | ORAL_TABLET | Freq: Every day | ORAL | 2 refills | Status: DC

## 2021-06-16 NOTE — ED Triage Notes (Signed)
Pt c/o urinary frequency and vaginal discharge. She is wanting to be tested for STDs.  ?Started: last week ?  ?Pt c/o sinus irritation.  ? ?Pt states she had an abortion 04/28/21 ?

## 2021-06-16 NOTE — ED Provider Notes (Signed)
?[facility] ? ? ? ?CSN: [id number] ?Arrival date & time: 06/16/21  1019 ?  ? ?HISTORY  ? ?Chief Complaint  ?Patient presents with  ? Urinary Frequency  ?  Entered by patient  ? Vaginal Discharge  ? Appointment  ? ?HPI ?Sharon Waller is a 26 y.o. female. Patient complains of urinary frequency with foul-smelling vaginal discharge, patient states he has not had any burning with urination, pelvic pain, pelvic pressure, fever, aches, chills, genital lesions.  Patient states she has a history of frequent bacterial vaginitis infections, typically does well with a 7-day course of metronidazole 500 mg twice daily.  Patient states she wants to be tested for STDs.  Patient states her symptoms began last week.  Patient states she is also been dealing with some sinus pressure, drainage and scratchy throat.  Patient states she was seen recently at another urgent care and provided with Zyrtec and Flonase which she states she is taking at this time.  Patient states they provide some relief but is still having the symptoms which she admits are improved from previous. ? ?The history is provided by the patient.  ?Past Medical History:  ?Diagnosis Date  ? Abdominal pain affecting pregnancy, antepartum 09/26/2015  ? [redacted]w[redacted]d on 09/23/15   ? Allergic rhinitis 07/11/2018  ? Asthma   ? Bacterial vaginosis   ? Bronchitis   ? Choroid plexus cyst of fetus 01/07/2016  ? Mild intermittent asthma 07/11/2018  ? SVD (spontaneous vaginal delivery) 05/04/2016  ? UTI (lower urinary tract infection)   ? ?Patient Active Problem List  ? Diagnosis Date Noted  ? Alpha thalassemia silent carrier 05/04/2021  ? Supervision of low-risk pregnancy, first trimester 04/01/2021  ? Palpitations 07/17/2018  ? SOB (shortness of breath) 07/17/2018  ? Chest pain of uncertain etiology 07/17/2018  ? Mild intermittent asthma 07/11/2018  ? Allergic rhinitis 07/11/2018  ? ?Past Surgical History:  ?Procedure Laterality Date  ? NO PAST SURGERIES    ? ?OB History   ? ?  Gravida  ?4  ? Para  ?1  ? Term  ?1  ? Preterm  ?0  ? AB  ?2  ? Living  ?1  ?  ? ? SAB  ?0  ? IAB  ?1  ? Ectopic  ?0  ? Multiple  ?0  ? Live Births  ?1  ?   ?  ?  ? ?Home Medications   ? ?Prior to Admission medications   ?Medication Sig Start Date End Date Taking? Authorizing Provider  ?cetirizine (ZYRTEC) 10 MG tablet Take 1 tablet (10 mg total) by mouth daily for 10 days. 04/20/21 04/30/21  Gustavus Bryant, FNP  ?fluticasone (FLONASE) 50 MCG/ACT nasal spray Place 1 spray into both nostrils daily for 3 days. 04/20/21 04/23/21  Gustavus Bryant, FNP  ?metoCLOPramide (REGLAN) 10 MG tablet Take 1 tablet (10 mg total) by mouth 4 (four) times daily. 04/15/21   Brand Males, CNM  ? ?Family History ?Family History  ?Problem Relation Age of Onset  ? Migraines Mother   ? Allergic rhinitis Father   ? Asthma Brother   ?     as child  ? Cancer Maternal Aunt   ? Breast cancer Maternal Aunt   ? Cancer Maternal Grandmother   ? Breast cancer Maternal Grandmother   ? Allergic rhinitis Paternal Grandmother   ? Allergic rhinitis Paternal Grandfather   ? ?Social History ?Social History  ? ?Tobacco Use  ? Smoking status: Former  ?  Types: Cigarettes  ?  Quit date: 09/30/2015  ?  Years since quitting: 5.7  ? Smokeless tobacco: Never  ?Vaping Use  ? Vaping Use: Never used  ?Substance Use Topics  ? Alcohol use: No  ? Drug use: No  ? ?Allergies   ?Augmentin [amoxicillin-pot clavulanate] ? ?Review of Systems ?Review of Systems ?Pertinent findings noted in history of present illness.  ? ?Physical Exam ?Triage Vital Signs ?ED Triage Vitals  ?Enc Vitals Group  ?   BP 01/08/21 0827 (!) 147/82  ?   Pulse Rate 01/08/21 0827 72  ?   Resp 01/08/21 0827 18  ?   Temp 01/08/21 0827 98.3 ?F (36.8 ?C)  ?   Temp Source 01/08/21 0827 Oral  ?   SpO2 01/08/21 0827 98 %  ?   Weight --   ?   Height --   ?   Head Circumference --   ?   Peak Flow --   ?   Pain Score 01/08/21 0826 5  ?   Pain Loc --   ?   Pain Edu? --   ?   Excl. in GC? --   ?No data  found. ? ?Updated Vital Signs ?BP 114/76 (BP Location: Right Arm)   Pulse (!) 54   Temp 98.5 ?F (36.9 ?C) (Oral)   Resp 18   LMP 06/01/2021 Comment: pt reports having an abortion 04/28/21  SpO2 98%   Breastfeeding Unknown  ? ?Physical Exam ?Vitals and nursing note reviewed.  ?Constitutional:   ?   General: She is not in acute distress. ?   Appearance: Normal appearance. She is not ill-appearing.  ?HENT:  ?   Head: Normocephalic and atraumatic.  ?   Salivary Glands: Right salivary gland is not diffusely enlarged or tender. Left salivary gland is not diffusely enlarged or tender.  ?   Right Ear: Ear canal and external ear normal. No drainage. A middle ear effusion is present. There is no impacted cerumen. Tympanic membrane is bulging. Tympanic membrane is not injected or erythematous.  ?   Left Ear: Ear canal and external ear normal. No drainage. A middle ear effusion is present. There is no impacted cerumen. Tympanic membrane is bulging. Tympanic membrane is not injected or erythematous.  ?   Ears:  ?   Comments: Bilateral EACs normal, both TMs bulging with clear fluid ?   Nose: Rhinorrhea present. No nasal deformity, septal deviation, signs of injury, nasal tenderness, mucosal edema or congestion. Rhinorrhea is clear.  ?   Right Nostril: Occlusion present. No foreign body, epistaxis or septal hematoma.  ?   Left Nostril: Occlusion present. No foreign body, epistaxis or septal hematoma.  ?   Right Turbinates: Enlarged, swollen and pale.  ?   Left Turbinates: Enlarged, swollen and pale.  ?   Right Sinus: No maxillary sinus tenderness or frontal sinus tenderness.  ?   Left Sinus: No maxillary sinus tenderness or frontal sinus tenderness.  ?   Mouth/Throat:  ?   Lips: Pink. No lesions.  ?   Mouth: Mucous membranes are moist. No oral lesions.  ?   Pharynx: Oropharynx is clear. Uvula midline. No posterior oropharyngeal erythema or uvula swelling.  ?   Tonsils: No tonsillar exudate. 0 on the right. 0 on the left.  ?    Comments: Postnasal drip ?Eyes:  ?   General: Lids are normal.     ?   Right eye: No discharge.     ?  Left eye: No discharge.  ?   Extraocular Movements: Extraocular movements intact.  ?   Conjunctiva/sclera: Conjunctivae normal.  ?   Right eye: Right conjunctiva is not injected.  ?   Left eye: Left conjunctiva is not injected.  ?Neck:  ?   Trachea: Trachea and phonation normal.  ?Cardiovascular:  ?   Rate and Rhythm: Normal rate and regular rhythm.  ?   Pulses: Normal pulses.  ?   Heart sounds: Normal heart sounds. No murmur heard. ?  No friction rub. No gallop.  ?Pulmonary:  ?   Effort: Pulmonary effort is normal. No accessory muscle usage, prolonged expiration or respiratory distress.  ?   Breath sounds: Normal breath sounds. No stridor, decreased air movement or transmitted upper airway sounds. No decreased breath sounds, wheezing, rhonchi or rales.  ?Chest:  ?   Chest wall: No tenderness.  ?Abdominal:  ?   General: Abdomen is flat. Bowel sounds are normal. There is no distension.  ?   Palpations: Abdomen is soft. There is no mass.  ?   Tenderness: There is no abdominal tenderness. There is no right CVA tenderness, left CVA tenderness, guarding or rebound.  ?   Hernia: No hernia is present.  ?Genitourinary: ?   Comments: Patient politely declines pelvic exam today, patient provided a vaginal swab for testing. ?Musculoskeletal:     ?   General: Normal range of motion.  ?   Cervical back: Normal range of motion and neck supple. Normal range of motion.  ?Lymphadenopathy:  ?   Cervical: No cervical adenopathy.  ?Skin: ?   General: Skin is warm and dry.  ?   Findings: No erythema or rash.  ?Neurological:  ?   General: No focal deficit present.  ?   Mental Status: She is alert and oriented to person, place, and time.  ?Psychiatric:     ?   Mood and Affect: Mood normal.     ?   Behavior: Behavior normal.  ? ? ?Visual Acuity ?Right Eye Distance:   ?Left Eye Distance:   ?Bilateral Distance:   ? ?Right Eye Near:   ?Left  Eye Near:    ?Bilateral Near:    ? ?UC Couse / Diagnostics / Procedures:  ?  ?EKG ? ?Radiology ?No results found. ? ?Procedures ?Procedures (including critical care time) ? ?UC Diagnoses / Final Clinical Impressions(s)

## 2021-06-16 NOTE — Discharge Instructions (Addendum)
Based on the history that you provided to me today, you were treated empirically for bacterial vaginitis with metronidazole 500 mg twice daily for the next 7 days.  I recommend that you abstain from sexual intercourse, tampon use while being treated. ?  ?The results of your STD testing today will be made available to you once they are complete, this typically takes 3 to 5 days.  They will initially be posted to your MyChart and, if any of your results are abnormal, you will receive a phone call with those results along with further instructions regarding any further treatment, if needed.  ?  ?The urinalysis that we performed in the clinic today was normal.  Because you are having symptoms, urine culture will be performed.  The results of the urine culture will be available in the next 3 to 5 days and will be posted to your MyChart account.  If there is an abnormal finding, you will be contacted by phone and advised of further treatment recommendations, if any. ?  ?Your symptoms and my physical exam findings are concerning for exacerbation of your underlying allergies.  It is important that you begin your allergy regiment now and are consistent with taking allergy medications exactly as prescribed.  Allergy medications are preventative and therefore only work well when they are taken daily, not "as needed". ?  ?Please see the list below for recommended medications, dosages and frequencies to provide relief of your current symptoms:   ?  ?Zyrtec (cetirizine): This is an excellent second-generation antihistamine that helps to reduce respiratory inflammatory response to environmental allergens.  In some patients, this medication can cause daytime sleepiness so I recommend that you take 1 tablet daily at bedtime.   ?  ?Flonase (fluticasone): This is a steroid nasal spray that you use once daily, 1 spray in each nare.  This medication does not work well if you decide to use it only used as you feel you need to, it works  best used on a daily basis.  After 3 to 5 days of use, you will notice significant reduction of the inflammation and mucus production that is currently being caused by exposure to allergens, whether seasonal or environmental.  The most common side effect of this medication is nosebleeds.  If you experience a nosebleed, please discontinue use for 1 week, then feel free to resume.  I have provided you with a prescription but you can also purchase this medication over-the-counter if your insurance will not cover it. ?  ?Ipratropium (Atrovent): This is an excellent nasal decongestant spray that does not cause rebound congestion, please instill 2 sprays into each nare with each use.  Please use the spray up to 4 times daily as needed.  I have provided you with a prescription for this medication.    ?  ?If you find that you have not had significant relief of your symptoms in the next 7 to 10 days, please follow-up with your primary care provider or return here to urgent care for repeat evaluation and further recommendations. ?  ?Thank you for visiting urgent care today.  We appreciate the opportunity to participate in your care. ? ? ? ?

## 2021-06-17 LAB — URINE CULTURE

## 2021-06-17 LAB — CERVICOVAGINAL ANCILLARY ONLY
Bacterial Vaginitis (gardnerella): POSITIVE — AB
Candida Glabrata: NEGATIVE
Candida Vaginitis: POSITIVE — AB
Chlamydia: NEGATIVE
Comment: NEGATIVE
Comment: NEGATIVE
Comment: NEGATIVE
Comment: NEGATIVE
Comment: NEGATIVE
Comment: NORMAL
Neisseria Gonorrhea: NEGATIVE
Trichomonas: NEGATIVE

## 2021-06-18 ENCOUNTER — Telehealth (HOSPITAL_COMMUNITY): Payer: Self-pay | Admitting: Emergency Medicine

## 2021-06-18 MED ORDER — METRONIDAZOLE 500 MG PO TABS: 500.0000 mg | ORAL_TABLET | Freq: Two times a day (BID) | ORAL | 0 refills | Status: DC

## 2021-06-18 MED ORDER — FLUCONAZOLE 150 MG PO TABS: 150.0000 mg | ORAL_TABLET | Freq: Once | ORAL | 0 refills | Status: AC

## 2021-07-20 ENCOUNTER — Ambulatory Visit
Admission: EM | Admit: 2021-07-20 | Discharge: 2021-07-20 | Disposition: A | Discharge status: Home / Self Care | Payer: Medicaid Other | Attending: Urgent Care | Admitting: Urgent Care

## 2021-07-20 ENCOUNTER — Encounter: Payer: Self-pay | Admitting: Emergency Medicine

## 2021-07-20 DIAGNOSIS — R300 Dysuria: Secondary | ICD-10-CM

## 2021-07-20 DIAGNOSIS — N898 Other specified noninflammatory disorders of vagina: Secondary | ICD-10-CM | POA: Diagnosis present

## 2021-07-20 HISTORY — DX: Dysuria: R30.0

## 2021-07-20 LAB — POCT URINALYSIS DIP (MANUAL ENTRY)
Bilirubin, UA: NEGATIVE
Blood, UA: NEGATIVE
Glucose, UA: NEGATIVE mg/dL
Ketones, POC UA: NEGATIVE mg/dL
Nitrite, UA: NEGATIVE
Protein Ur, POC: NEGATIVE mg/dL
Spec Grav, UA: >=1.03 — AB (ref 1.010–1.025)
Urobilinogen, UA: 0.2 E.U./dL
pH, UA: 6 (ref 5.0–8.0)

## 2021-07-20 LAB — POCT URINE PREGNANCY: Preg Test, Ur: NEGATIVE

## 2021-07-20 NOTE — ED Provider Notes (Signed)
?[facility] ? ? ? ?CSN: [id number] ?Arrival date & time: 07/20/21  1454 ? ?  ? ?History   ?Chief Complaint ?Chief Complaint  ?Patient presents with  ? Dysuria  ? Vaginal Irritation  ? ? ?HPI ?Sharon Waller is a 26 y.o. female.  ?Patient presents with 2-day history of vaginal irritation.  She has not noticed any change in discharge amount, color, or odor.  She is sexually active and had a new partner on Saturday.  Did not use protection.  She also has some irritation when she holds her urine.  Reports no pain, just discomfort. States she has not been eating or drinking enough water.  Denies dysuria, frequency, urgency, abdominal pain, hematuria, fever. ?She does have history of BV but does not know if this episode feels the same. ? ?Past Medical History:  ?Diagnosis Date  ? Abdominal pain affecting pregnancy, antepartum 09/26/2015  ? [redacted]w[redacted]d on 09/23/15   ? Allergic rhinitis 07/11/2018  ? Asthma   ? Bacterial vaginosis   ? Bronchitis   ? Choroid plexus cyst of fetus 01/07/2016  ? Mild intermittent asthma 07/11/2018  ? SVD (spontaneous vaginal delivery) 05/04/2016  ? UTI (lower urinary tract infection)   ? ? ?Patient Active Problem List  ? Diagnosis Date Noted  ? Dysuria 07/20/2021  ? Alpha thalassemia silent carrier 05/04/2021  ? Supervision of low-risk pregnancy, first trimester 04/01/2021  ? Palpitations 07/17/2018  ? SOB (shortness of breath) 07/17/2018  ? Chest pain of uncertain etiology 07/17/2018  ? Mild intermittent asthma 07/11/2018  ? Allergic rhinitis 07/11/2018  ? ? ?Past Surgical History:  ?Procedure Laterality Date  ? NO PAST SURGERIES    ? ? ?OB History   ? ? Gravida  ?4  ? Para  ?1  ? Term  ?1  ? Preterm  ?0  ? AB  ?2  ? Living  ?1  ?  ? ? SAB  ?0  ? IAB  ?1  ? Ectopic  ?0  ? Multiple  ?0  ? Live Births  ?1  ?   ?  ?  ? ? ? ?Home Medications   ? ?Prior to Admission medications   ?Medication Sig Start Date End Date Taking? Authorizing Provider  ?cetirizine (ZYRTEC ALLERGY) 10 MG tablet Take 1 tablet  (10 mg total) by mouth at bedtime. 06/16/21 09/14/21  Theadora Rama Scales, PA-C  ?fluticasone (FLONASE) 50 MCG/ACT nasal spray Place 1 spray into both nostrils daily. Begin by using 2 sprays in each nare daily for 3 to 5 days, then decrease to 1 spray in each nare daily. 06/16/21   Theadora Rama Scales, PA-C  ?ipratropium (ATROVENT) 0.06 % nasal spray Place 2 sprays into both nostrils 4 (four) times daily. As needed for nasal congestion, runny nose 06/16/21   Theadora Rama Scales, PA-C  ?metoCLOPramide (REGLAN) 10 MG tablet Take 1 tablet (10 mg total) by mouth 4 (four) times daily. 04/15/21   Brand Males, CNM  ?metroNIDAZOLE (FLAGYL) 500 MG tablet Take 1 tablet (500 mg total) by mouth 2 (two) times daily. 06/18/21   Lamptey, Britta Mccreedy, MD  ? ? ?Family History ?Family History  ?Problem Relation Age of Onset  ? Migraines Mother   ? Allergic rhinitis Father   ? Asthma Brother   ?     as child  ? Cancer Maternal Aunt   ? Breast cancer Maternal Aunt   ? Cancer Maternal Grandmother   ? Breast cancer Maternal Grandmother   ? Allergic  rhinitis Paternal Grandmother   ? Allergic rhinitis Paternal Grandfather   ? ? ?Social History ?Social History  ? ?Tobacco Use  ? Smoking status: Former  ?  Types: Cigarettes  ?  Quit date: 09/30/2015  ?  Years since quitting: 5.8  ? Smokeless tobacco: Never  ?Vaping Use  ? Vaping Use: Never used  ?Substance Use Topics  ? Alcohol use: No  ? Drug use: No  ? ? ? ?Allergies   ?Augmentin [amoxicillin-pot clavulanate] ? ? ?Review of Systems ?Review of Systems  ?Genitourinary:  Negative for dysuria.  ?     Vaginal irritation  ? ?As per HPI ? ?Physical Exam ?Triage Vital Signs ?ED Triage Vitals  ?Enc Vitals Group  ?   BP 07/20/21 1516 120/77  ?   Pulse Rate 07/20/21 1516 61  ?   Resp 07/20/21 1516 20  ?   Temp 07/20/21 1516 98.2 ?F (36.8 ?C)  ?   Temp src --   ?   SpO2 07/20/21 1516 98 %  ?   Weight --   ?   Height --   ?   Head Circumference --   ?   Peak Flow --   ?   Pain Score 07/20/21 1519 0  ?    Pain Loc --   ?   Pain Edu? --   ?   Excl. in GC? --   ? ?No data found. ? ?Updated Vital Signs ?BP 120/77   Pulse 61   Temp 98.2 ?F (36.8 ?C)   Resp 20   LMP 07/12/2021   SpO2 98%  ? ? ?Physical Exam ?Vitals and nursing note reviewed.  ?Constitutional:   ?   General: She is not in acute distress. ?HENT:  ?   Mouth/Throat:  ?   Mouth: Mucous membranes are moist.  ?   Pharynx: Oropharynx is clear.  ?Eyes:  ?   Conjunctiva/sclera: Conjunctivae normal.  ?   Pupils: Pupils are equal, round, and reactive to light.  ?Cardiovascular:  ?   Rate and Rhythm: Normal rate and regular rhythm.  ?   Heart sounds: Normal heart sounds.  ?Pulmonary:  ?   Effort: Pulmonary effort is normal.  ?   Breath sounds: Normal breath sounds.  ?Abdominal:  ?   General: Bowel sounds are normal.  ?   Palpations: Abdomen is soft.  ?   Tenderness: There is no abdominal tenderness. There is no right CVA tenderness or left CVA tenderness.  ?Genitourinary: ?   Comments: Deferred ?Neurological:  ?   Mental Status: She is alert and oriented to person, place, and time.  ? ? ?UC Treatments / Results  ?Labs ?(all labs ordered are listed, but only abnormal results are displayed) ?Labs Reviewed  ?POCT URINALYSIS DIP (MANUAL ENTRY) - Abnormal; Notable for the following components:  ?    Result Value  ? Clarity, UA cloudy (*)   ? Spec Grav, UA >=1.030 (*)   ? Leukocytes, UA Trace (*)   ? All other components within normal limits  ?POCT URINE PREGNANCY - Normal  ?URINE CULTURE  ?CERVICOVAGINAL ANCILLARY ONLY  ? ?EKG ? ?Radiology ?No results found. ? ?Procedures ?Procedures  ? ?Medications Ordered in UC ?Medications - No data to display ? ?Initial Impression / Assessment and Plan / UC Course  ?I have reviewed the triage vital signs and the nursing notes. ? ?Pertinent labs & imaging results that were available during my care of the patient were reviewed by me and  considered in my medical decision making (see chart for details). ?  ?Urinalysis in clinic not  concerning for acute bacterial infection, urine culture pending.  Urine pregnancy in clinic was negative.  Cytology pending at this time. ?Urine does seem to be concentrated, discussed with patient importance of staying hydrated.  Also discussed drinking 64 ounces of water a day will help with some of the symptoms that she is having. Also recommended to empty the bladder evert 4 hours.  We will notify her via MyChart if any of her results are positive and will treat accordingly.  Patient agrees to plan and is discharged in stable condition. ? ?Final Clinical Impressions(s) / UC Diagnoses  ? ?Final diagnoses:  ?Vaginal irritation  ? ? ? ?Discharge Instructions   ? ?  ?You will be notified of your test results via MyChart. ? ?Please drink at least 64 oz of water every day. ? ?Please return to the urgent care or emergency department if symptoms worsen or do not improve. ? ? ? ?ED Prescriptions   ?None ?  ? ?PDMP not reviewed this encounter. ?  ?Audrinna Sherman, Lurena JoinerRebecca, PA-C ?07/20/21 1605 ? ?

## 2021-07-20 NOTE — Discharge Instructions (Addendum)
You will be notified of your test results via MyChart. ? ?Please drink at least 64 oz of water every day. ? ?Please return to the urgent care or emergency department if symptoms worsen or do not improve. ?

## 2021-07-20 NOTE — ED Triage Notes (Signed)
Pt here with vaginal irritation, some urinary urgency and frequency x 2 days. Endorses new sexual partner on Saturday, but denies odor or discharge.  ?

## 2021-07-22 LAB — CERVICOVAGINAL ANCILLARY ONLY
Bacterial Vaginitis (gardnerella): POSITIVE — AB
Candida Glabrata: NEGATIVE
Candida Vaginitis: NEGATIVE
Chlamydia: NEGATIVE
Comment: NEGATIVE
Comment: NEGATIVE
Comment: NEGATIVE
Comment: NEGATIVE
Comment: NEGATIVE
Comment: NORMAL
Neisseria Gonorrhea: NEGATIVE
Trichomonas: NEGATIVE

## 2021-07-22 LAB — URINE CULTURE: Culture: NO GROWTH

## 2021-07-23 ENCOUNTER — Telehealth (HOSPITAL_COMMUNITY): Payer: Self-pay | Admitting: Emergency Medicine

## 2021-07-23 MED ORDER — METRONIDAZOLE 500 MG PO TABS: 500.0000 mg | ORAL_TABLET | Freq: Two times a day (BID) | ORAL | 0 refills | Status: DC

## 2021-08-16 ENCOUNTER — Other Ambulatory Visit: Payer: Self-pay | Admitting: Nurse Practitioner

## 2021-08-16 ENCOUNTER — Ambulatory Visit
Admission: EM | Admit: 2021-08-16 | Discharge: 2021-08-16 | Disposition: A | Discharge status: Home / Self Care | Payer: Medicaid Other | Attending: Nurse Practitioner | Admitting: Nurse Practitioner

## 2021-08-16 ENCOUNTER — Encounter: Payer: Self-pay | Admitting: Emergency Medicine

## 2021-08-16 DIAGNOSIS — N912 Amenorrhea, unspecified: Secondary | ICD-10-CM

## 2021-08-16 DIAGNOSIS — Z3A01 Less than 8 weeks gestation of pregnancy: Secondary | ICD-10-CM

## 2021-08-16 DIAGNOSIS — Z3201 Encounter for pregnancy test, result positive: Secondary | ICD-10-CM | POA: Diagnosis not present

## 2021-08-16 LAB — POCT URINE PREGNANCY: Preg Test, Ur: POSITIVE — AB

## 2021-08-16 NOTE — ED Triage Notes (Signed)
Patient took Pregnancy test last week and was positive, Patient request Pregnancy test today for medical documentation  

## 2021-08-16 NOTE — Discharge Instructions (Addendum)
Congratulations you are pregnant!!! Based on your last menstrual period (July 09, 2021) you are currently 5 weeks and 3 days pregnant. Your estimated delivery date will be April 15, 2022.  This date may change according to your ultrasound. Take daily prenatal vitamins. Drink plenty of fluids. No smoking or alcohol use. Please review the attached information.  Call your OB/GYN to schedule follow-up and prenatal care.

## 2021-08-16 NOTE — ED Notes (Signed)
Patient took Pregnancy test last week and was positive, Patient request Pregnancy test today for medical documentation

## 2021-08-16 NOTE — ED Provider Notes (Signed)
[facility]    CSN: [id number] Arrival date & time: 08/16/21  1219      History   Chief Complaint Chief Complaint  Patient presents with   Possible Pregnancy    HPI Sharon Waller is a 26 y.o. female.   Subjective:  Sharon Waller is a 26 y.o. female, G3P1, who presents for evaluation of amenorrhea. She believes she could be pregnant. Patient is ambivalent about pregnancy.  She is sexually active with 1 female partner.  She does not use any contraception.  Current symptoms include frequent urination, positive home pregnancy test, and headache. Last period was on 07/09/2021 and was normal.  The following portions of the patient's history were reviewed and updated as appropriate: allergies, current medications, past family history, past medical history, past social history, past surgical history, and problem list.        Past Medical History:  Diagnosis Date   Abdominal pain affecting pregnancy, antepartum 09/26/2015   [redacted]w[redacted]d on 09/23/15    Allergic rhinitis 07/11/2018   Asthma    Bacterial vaginosis    Bronchitis    Choroid plexus cyst of fetus 01/07/2016   Mild intermittent asthma 07/11/2018   SVD (spontaneous vaginal delivery) 05/04/2016   UTI (lower urinary tract infection)     Patient Active Problem List   Diagnosis Date Noted   Dysuria 07/20/2021   Alpha thalassemia silent carrier 05/04/2021   Supervision of low-risk pregnancy, first trimester 04/01/2021   Palpitations 07/17/2018   SOB (shortness of breath) 07/17/2018   Chest pain of uncertain etiology 07/17/2018   Mild intermittent asthma 07/11/2018   Allergic rhinitis 07/11/2018    Past Surgical History:  Procedure Laterality Date   NO PAST SURGERIES      OB History     Gravida  4   Para  1   Term  1   Preterm  0   AB  2   Living  1      SAB  0   IAB  1   Ectopic  0   Multiple  0   Live Births  1            Home Medications    Prior to Admission medications    Medication Sig Start Date End Date Taking? Authorizing Provider  cetirizine (ZYRTEC ALLERGY) 10 MG tablet Take 1 tablet (10 mg total) by mouth at bedtime. 06/16/21 09/14/21  Theadora Rama Scales, PA-C  fluticasone (FLONASE) 50 MCG/ACT nasal spray Place 1 spray into both nostrils daily. Begin by using 2 sprays in each nare daily for 3 to 5 days, then decrease to 1 spray in each nare daily. 06/16/21   Theadora Rama Scales, PA-C  ipratropium (ATROVENT) 0.06 % nasal spray Place 2 sprays into both nostrils 4 (four) times daily. As needed for nasal congestion, runny nose 06/16/21   Theadora Rama Scales, PA-C  metoCLOPramide (REGLAN) 10 MG tablet Take 1 tablet (10 mg total) by mouth 4 (four) times daily. 04/15/21   Brand Males, CNM  metroNIDAZOLE (FLAGYL) 500 MG tablet Take 1 tablet (500 mg total) by mouth 2 (two) times daily. 07/23/21   Lamptey, Britta Mccreedy, MD    Family History Family History  Problem Relation Age of Onset   Migraines Mother    Allergic rhinitis Father    Asthma Brother        as child   Cancer Maternal Aunt    Breast cancer Maternal Aunt    Cancer Maternal Grandmother  Breast cancer Maternal Grandmother    Allergic rhinitis Paternal Grandmother    Allergic rhinitis Paternal Grandfather     Social History Social History   Tobacco Use   Smoking status: Former    Types: Cigarettes    Quit date: 09/30/2015    Years since quitting: 5.8   Smokeless tobacco: Never  Vaping Use   Vaping Use: Never used  Substance Use Topics   Alcohol use: No   Drug use: No     Allergies   Augmentin [amoxicillin-pot clavulanate]   Review of Systems Review of Systems  Gastrointestinal:  Negative for nausea and vomiting.  Genitourinary:  Positive for frequency. Negative for dysuria and vaginal discharge.  Musculoskeletal:  Negative for back pain.  Neurological:  Positive for headaches.  All other systems reviewed and are negative.   Physical Exam Triage Vital Signs ED Triage  Vitals  Enc Vitals Group     BP 08/16/21 1247 114/80     Pulse Rate 08/16/21 1247 74     Resp 08/16/21 1252 18     Temp 08/16/21 1247 98.2 F (36.8 C)     Temp Source 08/16/21 1247 Tympanic     SpO2 08/16/21 1247 100 %     Weight --      Height --      Head Circumference --      Peak Flow --      Pain Score 08/16/21 1253 0     Pain Loc --      Pain Edu? --      Excl. in GC? --    No data found.  Updated Vital Signs BP 114/80 (BP Location: Right Arm)   Pulse 74   Temp 98.2 F (36.8 C) (Tympanic)   Resp 18   LMP 01/23/2021   SpO2 100%   Visual Acuity Right Eye Distance:   Left Eye Distance:   Bilateral Distance:    Right Eye Near:   Left Eye Near:    Bilateral Near:     Physical Exam Vitals reviewed.  Constitutional:      Appearance: Normal appearance.  HENT:     Head: Normocephalic.  Cardiovascular:     Rate and Rhythm: Normal rate.  Pulmonary:     Effort: Pulmonary effort is normal.  Abdominal:     Palpations: Abdomen is soft.  Musculoskeletal:        General: Normal range of motion.     Cervical back: Normal range of motion and neck supple.  Skin:    General: Skin is warm and dry.  Neurological:     General: No focal deficit present.     Mental Status: She is alert and oriented to person, place, and time.     UC Treatments / Results  Labs (all labs ordered are listed, but only abnormal results are displayed) Labs Reviewed  POCT URINE PREGNANCY - Abnormal; Notable for the following components:      Result Value   Preg Test, Ur Positive (*)    All other components within normal limits  HCG, QUANTITATIVE, PREGNANCY    EKG   Radiology No results found.  Procedures Procedures (including critical care time)  Medications Ordered in UC Medications - No data to display  Initial Impression / Assessment and Plan / UC Course  I have reviewed the triage vital signs and the nursing notes.  Pertinent labs & imaging results that were available  during my care of the patient were reviewed by me and considered  in my medical decision making (see chart for details).    26 year old female presenting with amenorrhea.  Last menstrual period was 07/09/2021.Pregnancy Test: Positive: EDC: 04/15/22. Briefly discussed pre-natal care options. Encouraged well-balanced diet, plenty of rest when needed, pre-natal vitamins daily and walking for exercise. Discussed self-help for nausea, avoiding OTC medications until consulting provider or pharmacist, other than Tylenol as needed, minimal caffeine (1-2 cups daily) and avoiding alcohol.  Patient is to schedule follow-up with OB as soon as possible to start prenatal care.   Today's evaluation has revealed no signs of a dangerous process. Discussed diagnosis with patient and/or guardian. Patient and/or guardian aware of their diagnosis, possible red flag symptoms to watch out for and need for close follow up. Patient and/or guardian understands verbal and written discharge instructions. Patient and/or guardian comfortable with plan and disposition.  Patient and/or guardian has a clear mental status at this time, good insight into illness (after discussion and teaching) and has clear judgment to make decisions regarding their care  Documentation was completed with the aid of voice recognition software. Transcription may contain typographical errors. Final Clinical Impressions(s) / UC Diagnoses   Final diagnoses:  Amenorrhea  Less than [redacted] weeks gestation of pregnancy     Discharge Instructions      Congratulations you are pregnant!!! Based on your last menstrual period (July 09, 2021) you are currently 5 weeks and 3 days pregnant. Your estimated delivery date will be April 15, 2022.  This date may change according to your ultrasound. Take daily prenatal vitamins. Drink plenty of fluids. No smoking or alcohol use. Please review the attached information.  Call your OB/GYN to schedule follow-up and prenatal  care.      ED Prescriptions   None    PDMP not reviewed this encounter.   Lurline Idol, [place] 08/16/21 1335

## 2021-08-19 LAB — BETA HCG QUANT (REF LAB): hCG Quant: 4902 m[IU]/mL

## 2021-08-19 LAB — SPECIMEN STATUS REPORT

## 2021-08-29 ENCOUNTER — Inpatient Hospital Stay (HOSPITAL_COMMUNITY): Payer: Medicaid Other

## 2021-08-29 ENCOUNTER — Inpatient Hospital Stay (HOSPITAL_COMMUNITY)
Admission: AD | Admit: 2021-08-29 | Discharge: 2021-08-29 | Disposition: A | Discharge status: Home / Self Care | Payer: Medicaid Other | Attending: Obstetrics & Gynecology | Admitting: Obstetrics & Gynecology

## 2021-08-29 ENCOUNTER — Encounter (HOSPITAL_COMMUNITY): Payer: Self-pay | Admitting: *Deleted

## 2021-08-29 ENCOUNTER — Other Ambulatory Visit: Payer: Self-pay

## 2021-08-29 DIAGNOSIS — Z6790 Unspecified blood type, Rh positive: Secondary | ICD-10-CM

## 2021-08-29 DIAGNOSIS — O039 Complete or unspecified spontaneous abortion without complication: Secondary | ICD-10-CM | POA: Diagnosis not present

## 2021-08-29 LAB — CBC WITH DIFFERENTIAL/PLATELET
Abs Immature Granulocytes: 0.01 10*3/uL (ref 0.00–0.07)
Basophils Absolute: 0 10*3/uL (ref 0.0–0.1)
Basophils Relative: 0 %
Eosinophils Absolute: 0 10*3/uL (ref 0.0–0.5)
Eosinophils Relative: 1 %
HCT: 35.5 % — ABNORMAL LOW (ref 36.0–46.0)
Hemoglobin: 11.9 g/dL — ABNORMAL LOW (ref 12.0–15.0)
Immature Granulocytes: 0 %
Lymphocytes Relative: 33 %
Lymphs Abs: 1.9 10*3/uL (ref 0.7–4.0)
MCH: 31 pg (ref 26.0–34.0)
MCHC: 33.5 g/dL (ref 30.0–36.0)
MCV: 92.4 fL (ref 80.0–100.0)
Monocytes Absolute: 0.5 10*3/uL (ref 0.1–1.0)
Monocytes Relative: 9 %
Neutro Abs: 3.3 10*3/uL (ref 1.7–7.7)
Neutrophils Relative %: 57 %
Platelets: 271 10*3/uL (ref 150–400)
RBC: 3.84 MIL/uL — ABNORMAL LOW (ref 3.87–5.11)
RDW: 13.9 % (ref 11.5–15.5)
WBC: 5.7 10*3/uL (ref 4.0–10.5)
nRBC: 0 % (ref 0.0–0.2)

## 2021-08-29 LAB — URINALYSIS, MICROSCOPIC (REFLEX): RBC / HPF: >50 RBC/hpf (ref 0–5)

## 2021-08-29 LAB — WET PREP, GENITAL
Clue Cells Wet Prep HPF POC: NONE SEEN
Sperm: NONE SEEN
Trich, Wet Prep: NONE SEEN
WBC, Wet Prep HPF POC: <10 (ref <10)
Yeast Wet Prep HPF POC: NONE SEEN

## 2021-08-29 LAB — COMPREHENSIVE METABOLIC PANEL
ALT: 17 U/L (ref 0–44)
AST: 17 U/L (ref 15–41)
Albumin: 3.8 g/dL (ref 3.5–5.0)
Alkaline Phosphatase: 41 U/L (ref 38–126)
Anion gap: 8 (ref 5–15)
BUN: <5 mg/dL — ABNORMAL LOW (ref 6–20)
CO2: 25 mmol/L (ref 22–32)
Calcium: 9.1 mg/dL (ref 8.9–10.3)
Chloride: 102 mmol/L (ref 98–111)
Creatinine, Ser: 0.54 mg/dL (ref 0.44–1.00)
GFR, Estimated: >60 mL/min (ref >60)
Glucose, Bld: 87 mg/dL (ref 70–99)
Potassium: 3.4 mmol/L — ABNORMAL LOW (ref 3.5–5.1)
Sodium: 135 mmol/L (ref 135–145)
Total Bilirubin: 0.8 mg/dL (ref 0.3–1.2)
Total Protein: 7.1 g/dL (ref 6.5–8.1)

## 2021-08-29 LAB — HCG, QUANTITATIVE, PREGNANCY: hCG, Beta Chain, Quant, S: 63409 m[IU]/mL — ABNORMAL HIGH (ref <5)

## 2021-08-29 LAB — URINALYSIS, ROUTINE W REFLEX MICROSCOPIC

## 2021-08-29 NOTE — MAU Note (Signed)
Sharon Waller is a 26 y.o. at [redacted]w[redacted]d here in MAU reporting: VB that began today @ 1440.  Reports had a gush of blood that ran down her legs and passed a blood clot in the toilet.  Reports currently wearing sanitary napkin. Denies abdominal pain/cramping. LMP: 07/09/2021 Onset of complaint: today Pain score: 0 Vitals:   08/29/21 1547  BP: 101/63  Pulse: 74  Resp: 18  Temp: 98.3 F (36.8 C)  SpO2: 99%     FHT:N/A Lab orders placed from triage:   UA

## 2021-08-29 NOTE — MAU Provider Note (Signed)
History     CSN: [id number]  Arrival date and time: 08/29/21 1521   Event Date/Time   First Provider Initiated Contact with Patient 08/29/21 1643      Chief Complaint  Patient presents with   Vaginal Bleeding   Sharon Waller is a 26 y.o. [id number] at [redacted]w[redacted]d who presents to MAU for vaginal bleeding which began this morning. Patient states [facility] went to [facility] for a medical termination. She took the mifepristone and was given the misoprostol to take at home, but decided she did not want to take the second set of pills. Instead, she called an unknown entity who prescribed her progesterone, which she has been taking for several days. She is not currently experiencing any pain, and believes she is having a miscarriage. She shows provider a pad that she has been wearing for about an hour that is less than 1/4 full with blood. Patient denies passing any blood clots. Patient states when she was at [facility] she had a 6w IUP with a heartbeat on [place].  Pt denies vaginal discharge/odor/itching. Pt denies N/V, abdominal pain, constipation, diarrhea, or urinary problems. Pt denies fever, chills, fatigue, sweating or changes in appetite. Pt denies SOB or chest pain. Pt denies dizziness, HA, light-headedness, weakness.    OB History     Gravida  5   Para  1   Term  1   Preterm  0   AB  2   Living  1      SAB  0   IAB  1   Ectopic  0   Multiple  0   Live Births  1           Past Medical History:  Diagnosis Date   Abdominal pain affecting pregnancy, antepartum 09/26/2015   [redacted]w[redacted]d on 09/23/15    Allergic rhinitis 07/11/2018   Asthma    Bacterial vaginosis    Bronchitis    Choroid plexus cyst of fetus 01/07/2016   Mild intermittent asthma 07/11/2018   SVD (spontaneous vaginal delivery) 05/04/2016   UTI (lower urinary tract infection)     Past Surgical History:  Procedure Laterality Date   NO PAST SURGERIES      Family History  Problem  Relation Age of Onset   Migraines Mother    Allergic rhinitis Father    Asthma Brother        as child   Cancer Maternal Aunt    Breast cancer Maternal Aunt    Cancer Maternal Grandmother    Breast cancer Maternal Grandmother    Allergic rhinitis Paternal Grandmother    Allergic rhinitis Paternal Grandfather     Social History   Tobacco Use   Smoking status: Former    Types: Cigarettes    Quit date: 09/30/2015    Years since quitting: 5.9   Smokeless tobacco: Never  Vaping Use   Vaping Use: Never used  Substance Use Topics   Alcohol use: No   Drug use: No    Allergies:  Allergies  Allergen Reactions   Augmentin [Amoxicillin-Pot Clavulanate] Rash    Has patient had a PCN reaction causing immediate rash, facial/tongue/throat swelling, SOB or lightheadedness with hypotension: Yes Has patient had a PCN reaction causing severe rash involving mucus membranes or skin necrosis: No Has patient had a PCN reaction that required hospitalization Yes Has patient had a PCN reaction occurring within the last 10 years: No If all of the above answers are "NO", then  may proceed with Cephalosporin use.     Medications Prior to Admission  Medication Sig Dispense Refill Last Dose   cetirizine (ZYRTEC ALLERGY) 10 MG tablet Take 1 tablet (10 mg total) by mouth at bedtime. 30 tablet 2    fluticasone (FLONASE) 50 MCG/ACT nasal spray Place 1 spray into both nostrils daily. Begin by using 2 sprays in each nare daily for 3 to 5 days, then decrease to 1 spray in each nare daily. 32 mL 1    ipratropium (ATROVENT) 0.06 % nasal spray Place 2 sprays into both nostrils 4 (four) times daily. As needed for nasal congestion, runny nose 15 mL 0    metoCLOPramide (REGLAN) 10 MG tablet Take 1 tablet (10 mg total) by mouth 4 (four) times daily. 60 tablet 3    metroNIDAZOLE (FLAGYL) 500 MG tablet Take 1 tablet (500 mg total) by mouth 2 (two) times daily. 14 tablet 0     Review of Systems  Constitutional:   Negative for chills, diaphoresis, fatigue and fever.  Eyes:  Negative for visual disturbance.  Respiratory:  Negative for shortness of breath.   Cardiovascular:  Negative for chest pain.  Gastrointestinal:  Negative for abdominal pain, constipation, diarrhea, nausea and vomiting.  Genitourinary:  Positive for vaginal bleeding. Negative for dysuria, flank pain, frequency, pelvic pain, urgency and vaginal discharge.  Neurological:  Negative for dizziness, weakness, light-headedness and headaches.   Physical Exam   Blood pressure 101/63, pulse 74, temperature 98.3 F (36.8 C), temperature source Oral, resp. rate 18, height 5\' 6"  (1.676 m), weight 61.6 kg, last menstrual period 07/09/2021, SpO2 99 %, unknown if currently breastfeeding.  Patient Vitals for the past 24 hrs:  BP Temp Temp src Pulse Resp SpO2 Height Weight  08/29/21 1547 101/63 98.3 F (36.8 C) Oral 74 18 99 % -- --  08/29/21 1541 -- -- -- -- -- -- 5\' 6"  (1.676 m) 61.6 kg   Physical Exam Vitals and nursing note reviewed.  Constitutional:      General: She is not in acute distress.    Appearance: Normal appearance. She is not ill-appearing, toxic-appearing or diaphoretic.  HENT:     Head: Normocephalic and atraumatic.  Pulmonary:     Effort: Pulmonary effort is normal.  Neurological:     Mental Status: She is alert and oriented to person, place, and time.  Psychiatric:        Mood and Affect: Mood normal.        Behavior: Behavior normal.        Thought Content: Thought content normal.        Judgment: Judgment normal.    Results for orders placed or performed during the hospital encounter of 08/29/21 (from the past 24 hour(s))  Urinalysis, Routine w reflex microscopic Urine, Clean Catch     Status: Abnormal   Collection Time: 08/29/21  3:49 PM  Result Value Ref Range   Color, Urine RED (A) YELLOW   APPearance TURBID (A) CLEAR   Specific Gravity, Urine  1.005 - 1.030    TEST NOT REPORTED DUE TO COLOR INTERFERENCE  OF URINE PIGMENT   pH  5.0 - 8.0    TEST NOT REPORTED DUE TO COLOR INTERFERENCE OF URINE PIGMENT   Glucose, UA (A) NEGATIVE mg/dL    TEST NOT REPORTED DUE TO COLOR INTERFERENCE OF URINE PIGMENT   Hgb urine dipstick LARGE (A) NEGATIVE   Bilirubin Urine (A) NEGATIVE    TEST NOT REPORTED DUE TO COLOR INTERFERENCE OF URINE  PIGMENT   Ketones, ur (A) NEGATIVE mg/dL    TEST NOT REPORTED DUE TO COLOR INTERFERENCE OF URINE PIGMENT   Protein, ur (A) NEGATIVE mg/dL    TEST NOT REPORTED DUE TO COLOR INTERFERENCE OF URINE PIGMENT   Nitrite (A) NEGATIVE    TEST NOT REPORTED DUE TO COLOR INTERFERENCE OF URINE PIGMENT   Leukocytes,Ua (A) NEGATIVE    TEST NOT REPORTED DUE TO COLOR INTERFERENCE OF URINE PIGMENT  Urinalysis, Microscopic (reflex)     Status: Abnormal   Collection Time: 08/29/21  3:49 PM  Result Value Ref Range   RBC / HPF >50 0 - 5 RBC/hpf   WBC, UA 21-50 0 - 5 WBC/hpf   Bacteria, UA RARE (A) NONE SEEN   Squamous Epithelial / LPF 0-5 0 - 5  CBC with Differential/Platelet     Status: Abnormal   Collection Time: 08/29/21  4:48 PM  Result Value Ref Range   WBC 5.7 4.0 - 10.5 K/uL   RBC 3.84 (L) 3.87 - 5.11 MIL/uL   Hemoglobin 11.9 (L) 12.0 - 15.0 g/dL   HCT 23.7 (L) 62.8 - 31.5 %   MCV 92.4 80.0 - 100.0 fL   MCH 31.0 26.0 - 34.0 pg   MCHC 33.5 30.0 - 36.0 g/dL   RDW 17.6 16.0 - 73.7 %   Platelets 271 150 - 400 K/uL   nRBC 0.0 0.0 - 0.2 %   Neutrophils Relative % 57 %   Neutro Abs 3.3 1.7 - 7.7 K/uL   Lymphocytes Relative 33 %   Lymphs Abs 1.9 0.7 - 4.0 K/uL   Monocytes Relative 9 %   Monocytes Absolute 0.5 0.1 - 1.0 K/uL   Eosinophils Relative 1 %   Eosinophils Absolute 0.0 0.0 - 0.5 K/uL   Basophils Relative 0 %   Basophils Absolute 0.0 0.0 - 0.1 K/uL   Immature Granulocytes 0 %   Abs Immature Granulocytes 0.01 0.00 - 0.07 K/uL  Comprehensive metabolic panel     Status: Abnormal   Collection Time: 08/29/21  4:48 PM  Result Value Ref Range   Sodium 135 135 - 145 mmol/L    Potassium 3.4 (L) 3.5 - 5.1 mmol/L   Chloride 102 98 - 111 mmol/L   CO2 25 22 - 32 mmol/L   Glucose, Bld 87 70 - 99 mg/dL   BUN <5 (L) 6 - 20 mg/dL   Creatinine, Ser 1.06 0.44 - 1.00 mg/dL   Calcium 9.1 8.9 - 26.9 mg/dL   Total Protein 7.1 6.5 - 8.1 g/dL   Albumin 3.8 3.5 - 5.0 g/dL   AST 17 15 - 41 U/L   ALT 17 0 - 44 U/L   Alkaline Phosphatase 41 38 - 126 U/L   Total Bilirubin 0.8 0.3 - 1.2 mg/dL   GFR, Estimated >48 >54 mL/min   Anion gap 8 5 - 15  hCG, quantitative, pregnancy     Status: Abnormal   Collection Time: 08/29/21  4:48 PM  Result Value Ref Range   hCG, Beta Chain, Quant, S 63,409 (H) <5 mIU/mL  Wet prep, genital     Status: None   Collection Time: 08/29/21  5:04 PM  Result Value Ref Range   Yeast Wet Prep HPF POC NONE SEEN NONE SEEN   Trich, Wet Prep NONE SEEN NONE SEEN   Clue Cells Wet Prep HPF POC NONE SEEN NONE SEEN   WBC, Wet Prep HPF POC <10 <10   Sperm NONE SEEN    US  OB LESS THAN 14 WEEKS WITH OB TRANSVAGINAL  Result Date: 08/29/2021 CLINICAL DATA:  Vaginal bleeding. Quantitative beta HCG level of 63,000 EXAM: OBSTETRIC <14 WK [place] AND TRANSVAGINAL OB US TECHNIQUE: Both transabdominal and transvaginal ultrasound examinations were performed for complete evaluation of the gestation as well as the maternal uterus, adnexal regions, and pelvic cul-de-sac. Transvaginal technique was performed to assess early pregnancy. COMPARISON:  None Available. FINDINGS: Intrauterine gestational sac: None Maternal uterus/adnexae: Retroverted uterus. Prominent endometrial stripe which is heterogeneous with a few internal cystic spaces although no well-defined gestational sac. Prominent blood flow is seen within the endometrium. Right ovary measures 3.9 x 3.0 x 3.2 cm and contains a probable corpus luteal cyst. Left ovary measures 3.8 x 1.5 x 1.3 cm and appears unremarkable. No adnexal masses are identified. No free fluid is seen within the pelvis. IMPRESSION: 1. No intrauterine pregnancy  or findings suspicious for ectopic pregnancy. Findings are consistent with pregnancy of unknown location and may reflect early intrauterine pregnancy not yet visualized sonographically, occult ectopic pregnancy, or failed pregnancy. Recommend trending of beta HCG as well as follow-up ultrasound in 7-10 days based on clinical course. 2. Abnormally heterogeneous appearance of the endometrium with prominent internal vascularity. In conjunction with an elevated beta HCG level, the possibility of a molar pregnancy is raised. Electronically Signed   By: Duanne Guess D.O.   On: 08/29/2021 18:24    MAU Course  Procedures  MDM -vaginal bleeding in setting of recent mifepristone use without misoprostol -UA: unable to be performed d/t color interference, sending urine for culture -CBC: WNL -CMP: no abnormalities requiring treatment -[place]: no IUP identified, possible molar pregnancy -hCG: 62,952 -ABO: A Positive -WetPrep: WNL -GC/CT collected -consulted with Dr. Despina Hidden re: [place] report of possible molar pregnancy, Dr. Despina Hidden reviews [place] images and states the [place] images are concerning for molar and she does not need a D&C, but does need to follow her quants down to 0 and agrees with plan for patient to take misoprostol at home as planned. -pt discharged to home in stable condition  Orders Placed This Encounter  Procedures   Wet prep, genital    Standing Status:   Standing    Number of Occurrences:   1   US OB LESS THAN 14 WEEKS WITH OB TRANSVAGINAL    Standing Status:   Standing    Number of Occurrences:   1    Order Specific Question:   Symptom/Reason for Exam    Answer:   Vaginal bleeding in pregnancy [[id number]]   Urinalysis, Routine w reflex microscopic Urine, Clean Catch    Standing Status:   Standing    Number of Occurrences:   1   CBC with Differential/Platelet    Standing Status:   Standing    Number of Occurrences:   1   Comprehensive metabolic panel    Standing Status:   Standing    Number of  Occurrences:   1   hCG, quantitative, pregnancy    Standing Status:   Standing    Number of Occurrences:   1   Urinalysis, Microscopic (reflex)    Standing Status:   Standing    Number of Occurrences:   1   No orders of the defined types were placed in this encounter.  Assessment and Plan   1. Miscarriage   2. Blood type, Rh positive    Allergies as of 08/29/2021       Reactions   Augmentin [amoxicillin-pot Clavulanate] Rash  Has patient had a PCN reaction causing immediate rash, facial/tongue/throat swelling, SOB or lightheadedness with hypotension: Yes Has patient had a PCN reaction causing severe rash involving mucus membranes or skin necrosis: No Has patient had a PCN reaction that required hospitalization Yes Has patient had a PCN reaction occurring within the last 10 years: No If all of the above answers are "NO", then may proceed with Cephalosporin use.        Medication List     TAKE these medications    cetirizine 10 MG tablet Commonly known as: ZyrTEC Allergy Take 1 tablet (10 mg total) by mouth at bedtime.   fluticasone 50 MCG/ACT nasal spray Commonly known as: FLONASE Place 1 spray into both nostrils daily. Begin by using 2 sprays in each nare daily for 3 to 5 days, then decrease to 1 spray in each nare daily.   ipratropium 0.06 % nasal spray Commonly known as: ATROVENT Place 2 sprays into both nostrils 4 (four) times daily. As needed for nasal congestion, runny nose   metoCLOPramide 10 MG tablet Commonly known as: Reglan Take 1 tablet (10 mg total) by mouth 4 (four) times daily.   metroNIDAZOLE 500 MG tablet Commonly known as: FLAGYL Take 1 tablet (500 mg total) by mouth 2 (two) times daily.       -pt to take Cytotec as planned at home -return precautions reviewed -[facility]/U visit with provider in 2 weeks -pt discharged to home in stable condition  Joni Reiningicole E Orest Dygert 08/29/2021,  6:25 PM

## 2021-08-30 ENCOUNTER — Telehealth: Payer: Self-pay

## 2021-08-30 LAB — GC/CHLAMYDIA PROBE AMP (~~LOC~~) NOT AT ARMC
Chlamydia: NEGATIVE
Comment: NEGATIVE
Comment: NORMAL
Neisseria Gonorrhea: NEGATIVE

## 2021-08-30 LAB — CULTURE, OB URINE: Culture: NO GROWTH

## 2021-08-30 NOTE — Telephone Encounter (Signed)
I called patient to review her upcoming appointments with her. Per patient she took the cytotec after being seen in MAU yesterday as advised. Patient states she passed some blood clots and is having some light bleeding today. I reviewed with patient that she is to have a beta HCG drawn on 09/06/21 and have a provider follow up appointment on 09/27/21. Patient verbalized understanding and denies any other questions.   Alesia Richards, RN 08/30/21

## 2021-09-06 ENCOUNTER — Other Ambulatory Visit: Payer: Self-pay

## 2021-09-06 ENCOUNTER — Ambulatory Visit: Payer: Medicaid Other | Admitting: Women's Health

## 2021-09-06 DIAGNOSIS — O039 Complete or unspecified spontaneous abortion without complication: Secondary | ICD-10-CM

## 2021-09-07 LAB — BETA HCG QUANT (REF LAB): hCG Quant: 2938 m[IU]/mL

## 2021-09-25 NOTE — Progress Notes (Signed)
Good morning,  This patient is scheduled for an SAB F/U visit on Monday 7/17. She really needs to be seen because of concern for a possible molar pregnancy. If she does not show, please be sure to reach out to her to help her reschedule her visit. I have also sent her a MyChart message.  Misty Stanley, just copying you as an FYI as she is scheduled with you on Monday 7/17. See my MAU note/consultation with Dr. Despina Hidden for additional information.  Thank you, Joni Reining

## 2021-09-27 ENCOUNTER — Other Ambulatory Visit: Payer: Self-pay

## 2021-09-27 ENCOUNTER — Encounter: Payer: Self-pay | Admitting: Advanced Practice Midwife

## 2021-09-27 ENCOUNTER — Ambulatory Visit (INDEPENDENT_AMBULATORY_CARE_PROVIDER_SITE_OTHER): Payer: Medicaid Other | Admitting: Advanced Practice Midwife

## 2021-09-27 VITALS — BP 106/67 | HR 64 | Wt 136.2 lb

## 2021-09-27 DIAGNOSIS — O039 Complete or unspecified spontaneous abortion without complication: Secondary | ICD-10-CM | POA: Diagnosis not present

## 2021-09-27 HISTORY — DX: Complete or unspecified spontaneous abortion without complication: O03.9

## 2021-09-27 NOTE — Progress Notes (Signed)
Subjective:     Sharon Waller is a 26 y.o. female here for a follow up visit after recent medical termination.  She was seen in [facility] on 08/29/21 for vaginal bleeding after taking the first medication, mifepristone, but deciding not to take the misoprostol afterwards.  She had hcg of 63, 409.  US showed no IUP or ectopic but abnormally heterogeneous appearance of endometrium, cannot rule out molar pregnancy.  Hcg was repeated on 6/26 and dropped to 2,938.    She had vaginal bleeding until 6/29. She has not had intercourse since her [facility] visit.    Reviewed pt health history today.    Gynecologic/OB History Patient's last menstrual period was 07/09/2021. Contraception: none Last Pap: 05/05/20. Results were: normal   Obstetric History OB History  Gravida Para Term Preterm AB Living  5 1 1  0 2 1  SAB IAB Ectopic Multiple Live Births  0 1 0 0 1    # Outcome Date GA Lbr Len/2nd Weight Sex Delivery Anes PTL Lv  5 Current           4 Term 05/04/16 [redacted]w[redacted]d 02:33 / 02:33 7 lb 4.1 oz (3.291 kg) F Vag-Spont EPI  LIV  3 AB           2 Gravida           1 IAB              The following portions of the patient's history were reviewed and updated as appropriate: allergies, current medications, past family history, past medical history, past social history, past surgical history, and problem list.  Review of Systems Pertinent items noted in HPI and remainder of comprehensive ROS otherwise negative.    Objective:   BP 106/67   Pulse 64   Wt 136 lb 3.2 oz (61.8 kg)   LMP 07/09/2021   Breastfeeding No   BMI 21.98 kg/m   VS reviewed, nursing note reviewed,  Constitutional: well developed, well nourished, no distress HEENT: normocephalic CV: normal rate Pulm/chest wall: normal effort Abdomen: soft Neuro: alert and oriented x 3 Skin: warm, dry Psych: affect normal   Assessment/Plan:    1. Miscarriage --Pt with symptoms, bloating, urinary frequency, back pain like early pregnancy. She  had positive home pregnancy test last week.   --07/11/2021 on 6/18 with some suspicion for molar pregnancy but hcg dropped significantly by 6/26.  --Plan to repeat hcg today and follow weekly down to below 5  - Beta hCG quant (ref lab)   Return in about 1 week (around 10/04/2021) for lab only visit for nonstat hcg.   10/06/2021, CNM 4:42 PM

## 2021-09-28 LAB — BETA HCG QUANT (REF LAB): hCG Quant: 531 m[IU]/mL

## 2021-10-04 ENCOUNTER — Other Ambulatory Visit: Payer: Medicaid Other

## 2021-10-11 NOTE — Progress Notes (Signed)
Pt DNKA for lab draw 7/24. Please call her to reschedule ASAP. Needs weekly quants until < 5 per Dr. Despina Hidden due to concern for molar pregnancy. Misty Stanley is managing.

## 2021-10-13 ENCOUNTER — Telehealth: Payer: Self-pay | Admitting: General Practice

## 2021-10-13 NOTE — Telephone Encounter (Signed)
-----   Message from [place], [place] sent at 10/11/2021  6:00 PM EDT ----- Pt DNKA for lab draw 7/24. Please call her to reschedule ASAP. Needs weekly quants until < 5 per Dr. Despina Hidden due to concern for molar pregnancy. Sharon Waller is managing.

## 2021-10-13 NOTE — Telephone Encounter (Signed)
Called patient, no answer- left message to call us back concerning an appt. Will send mychart message

## 2021-10-31 ENCOUNTER — Other Ambulatory Visit: Payer: Self-pay

## 2021-10-31 ENCOUNTER — Ambulatory Visit
Admission: EM | Admit: 2021-10-31 | Discharge: 2021-10-31 | Disposition: A | Discharge status: Home / Self Care | Payer: Medicaid Other | Attending: Emergency Medicine | Admitting: Emergency Medicine

## 2021-10-31 DIAGNOSIS — I951 Orthostatic hypotension: Secondary | ICD-10-CM | POA: Diagnosis not present

## 2021-10-31 DIAGNOSIS — R55 Syncope and collapse: Secondary | ICD-10-CM | POA: Diagnosis not present

## 2021-10-31 LAB — POCT URINE PREGNANCY: Preg Test, Ur: NEGATIVE

## 2021-10-31 MED ORDER — IBUPROFEN 600 MG PO TABS: 600.0000 mg | ORAL_TABLET | Freq: Four times a day (QID) | ORAL | 0 refills | Status: DC | PRN

## 2021-10-31 NOTE — Discharge Instructions (Addendum)
Your EKG did not show any acute changes your urine pregnancy was negative.  Push electrolyte containing fluids such as Pedialyte or Gatorade.  You may take 600 mg of ibuprofen combined with 1000 mg of Tylenol 3-4 times a day as needed for neck soreness.  Go to the ER if this occurs again, and follow-up with your cardiologist as soon as you can.

## 2021-10-31 NOTE — ED Provider Notes (Signed)
HPI  SUBJECTIVE:  Sharon Waller is a 26 y.o. female who presents with an episode of syncope around 0500 this morning while getting up off of the toilet.  She had 1 alcoholic drink at 0130, she watched the drink being poured and did not leave it unattended for the entire time she had it.  She got home at 0430.  She denies preceding chest pressure, heaviness, palpitations, shortness of breath prior to the syncopal episode.  She has been eating and drinking normally.  She reports neck soreness, but no headache, limitation of motion, extremity numbness, tingling, weakness.  No abdominal, pelvic pain, vaginal bleeding.  No calf swelling, hemoptysis, exogenous estrogen, surgery in the past 4 weeks, recent prolonged immobilization, history of PE, DVT.  No aggravating or alleviating factors.  She has not tried anything for this.  Past medical history negative for syncope.  She has a history of an arrhythmia, but does not remember the diagnosis.  It resolved 2 years ago, and is no longer seeing cardiology.  No history of diabetes.  LMP: July. Not sure if she could be pregnant.    Past Medical History:  Diagnosis Date   Abdominal pain affecting pregnancy, antepartum 09/26/2015   [redacted]w[redacted]d on 09/23/15    Allergic rhinitis 07/11/2018   Asthma    Bacterial vaginosis    Bronchitis    Choroid plexus cyst of fetus 01/07/2016   Mild intermittent asthma 07/11/2018   SVD (spontaneous vaginal delivery) 05/04/2016   UTI (lower urinary tract infection)     Past Surgical History:  Procedure Laterality Date   NO PAST SURGERIES      Family History  Problem Relation Age of Onset   Migraines Mother    Allergic rhinitis Father    Asthma Brother        as child   Cancer Maternal Aunt    Breast cancer Maternal Aunt    Cancer Maternal Grandmother    Breast cancer Maternal Grandmother    Allergic rhinitis Paternal Grandmother    Allergic rhinitis Paternal Grandfather     Social History   Tobacco Use   Smoking  status: Former    Types: Cigarettes    Quit date: 09/30/2015    Years since quitting: 6.0   Smokeless tobacco: Never  Vaping Use   Vaping Use: Never used  Substance Use Topics   Alcohol use: Yes    Comment: social   Drug use: No    No current facility-administered medications for this encounter.  Current Outpatient Medications:    ibuprofen (ADVIL) 600 MG tablet, Take 1 tablet (600 mg total) by mouth every 6 (six) hours as needed., Disp: 30 tablet, Rfl: 0   cetirizine (ZYRTEC ALLERGY) 10 MG tablet, Take 1 tablet (10 mg total) by mouth at bedtime., Disp: 30 tablet, Rfl: 2   metoCLOPramide (REGLAN) 10 MG tablet, Take 1 tablet (10 mg total) by mouth 4 (four) times daily. (Patient not taking: Reported on 09/27/2021), Disp: 60 tablet, Rfl: 3  Allergies  Allergen Reactions   Augmentin [Amoxicillin-Pot Clavulanate] Rash    Has patient had a PCN reaction causing immediate rash, facial/tongue/throat swelling, SOB or lightheadedness with hypotension: Yes Has patient had a PCN reaction causing severe rash involving mucus membranes or skin necrosis: No Has patient had a PCN reaction that required hospitalization Yes Has patient had a PCN reaction occurring within the last 10 years: No If all of the above answers are "NO", then may proceed with Cephalosporin use.  ROS  As noted in HPI.   Physical Exam  BP 109/70 (BP Location: Right Arm)   Pulse 62   Temp 98.2 F (36.8 C) (Oral)   Resp 18   LMP 09/25/2021 (Approximate)   SpO2 98%   Breastfeeding Unknown   No data found.     Constitutional: Well developed, well nourished, no acute distress Eyes:  EOMI, conjunctiva normal bilaterally HENT: Normocephalic, atraumatic,mucus membranes moist Respiratory: Normal inspiratory effort Cardiovascular: Normal rate, regular rhythm, no murmurs rubs or gallops GI: nondistended skin: No rash, skin intact Musculoskeletal: No C-spine tenderness.  Bilateral trapezial tenderness.  No  appreciable muscle spasm.  She is able to rotate her head 45 degrees to the left and right.  Calves symmetric. Neurologic: Alert & oriented x 3, no focal neuro deficits.  Moving all extremities equally. Psychiatric: Speech and behavior appropriate   ED Course   Medications - No data to display  Orders Placed This Encounter  Procedures   POCT urine pregnancy    Standing Status:   Standing    Number of Occurrences:   1   ED EKG    Standing Status:   Standing    Number of Occurrences:   1    Order Specific Question:   Reason for Exam    Answer:   Syncope   EKG 12-Lead    Standing Status:   Standing    Number of Occurrences:   1    No results found for this or any previous visit (from the past 24 hour(s)).  No results found.  ED Clinical Impression  1. Vasovagal syncope   2. Orthostasis      ED Assessment/Plan  Patient with syncope. Patient is PERC negative.  Doubt PE.    Checking EKG, urine pregnancy.  Patient is also orthostatic.  Could be a combination of micturition syncope, dehydration.  No evidence of significant C-spine injury.  Urine pregnancy negative. EKG: Sinus bradycardia with sinus arrhythmia rate 57.  Normal axis, normal intervals.  No hypertrophy.  T wave inversion in 3, aVF, V3 and V4 present on EKG from August 2020.  No ST elevation.  No acute changes from EKG August 2020.  Suspect vasovagal versus dehydration versus micturition syncope.  We will have her follow-up with her cardiologist, and she is to go to the ED if this recurs.  Advised pushing increased fluids, will send home with Tylenol/ibuprofen for the neck soreness.  Discussed labs, EKG, MDM, treatment plan, and plan for follow-up with patient. Discussed sn/sx that should prompt return to the ED. patient agrees with plan.   Meds ordered this encounter  Medications   ibuprofen (ADVIL) 600 MG tablet    Sig: Take 1 tablet (600 mg total) by mouth every 6 (six) hours as needed.    Dispense:  30 tablet     Refill:  0      *This clinic note was created using Scientist, clinical (histocompatibility and immunogenetics). Therefore, there may be occasional mistakes despite careful proofreading.  ?    Domenick Gong, MD 11/02/21 [phone]

## 2021-10-31 NOTE — ED Triage Notes (Addendum)
Pt states that she went out last pm and had 1 drink at 0100. Pt got home around 0420. Pt states that she got up around 0500 to go to the bathroom and only remembers waking up on the floor. Pt states she assumes she fell face first because she noted the makeup on the floor. Pt does not know what caused her to pass out or how long she was out. Pt c/o neck pain.

## 2021-12-31 ENCOUNTER — Ambulatory Visit
Admission: RE | Admit: 2021-12-31 | Discharge: 2021-12-31 | Disposition: A | Discharge status: Home / Self Care | Payer: Medicaid Other | Source: Ambulatory Visit | Attending: Emergency Medicine | Admitting: Emergency Medicine

## 2021-12-31 VITALS — BP 113/79 | HR 51 | Temp 98.8°F | Resp 16

## 2021-12-31 DIAGNOSIS — J989 Respiratory disorder, unspecified: Secondary | ICD-10-CM

## 2021-12-31 DIAGNOSIS — T7840XA Allergy, unspecified, initial encounter: Secondary | ICD-10-CM | POA: Diagnosis not present

## 2021-12-31 DIAGNOSIS — J302 Other seasonal allergic rhinitis: Secondary | ICD-10-CM

## 2021-12-31 MED ORDER — IPRATROPIUM BROMIDE 0.06 % NA SOLN: 2.0000 | Freq: Three times a day (TID) | NASAL | 1 refills | Status: DC

## 2021-12-31 MED ORDER — PROMETHAZINE-DM 6.25-15 MG/5ML PO SYRP: 5.0000 mL | ORAL_SOLUTION | Freq: Four times a day (QID) | ORAL | 0 refills | Status: DC | PRN

## 2021-12-31 MED ORDER — ALBUTEROL SULFATE (2.5 MG/3ML) 0.083% IN NEBU
2.5000 mg | INHALATION_SOLUTION | Freq: Once | RESPIRATORY_TRACT | Status: AC
  Administered 2021-12-31: 2.5 mg via RESPIRATORY_TRACT

## 2021-12-31 MED ORDER — FLUTICASONE PROPIONATE 50 MCG/ACT NA SUSP: 1.0000 | Freq: Every day | NASAL | 1 refills | Status: DC

## 2021-12-31 MED ORDER — METHYLPREDNISOLONE 4 MG PO TBPK: ORAL_TABLET | ORAL | 0 refills | Status: DC

## 2021-12-31 NOTE — ED Triage Notes (Signed)
Pt c/o cough, congestion and sore throat that started about 2-3 weeks ago.  Home interventions: mucinex, Flonase, Claritin

## 2021-12-31 NOTE — Discharge Instructions (Addendum)
Your symptoms and my physical exam findings are concerning for exacerbation of your underlying allergies.     Please see the list below for recommended medications, dosages and frequencies to provide relief of current symptoms:     Zyrtec (cetirizine): This is an excellent second-generation antihistamine that helps to reduce respiratory inflammatory response to environmental allergens.  In some patients, this medication can cause daytime sleepiness so I recommend that you take 1 tablet daily at bedtime.    Flonase (fluticasone): This is a steroid nasal spray that you use once daily, 1 spray in each nare.  This medication does not work well if you decide to use it only used as you feel you need to, it works best used on a daily basis.  After 3 to 5 days of use, you will notice significant reduction of the inflammation and mucus production that is currently being caused by exposure to allergens, whether seasonal or environmental.  The most common side effect of this medication is nosebleeds.  If you experience a nosebleed, please discontinue use for 1 week, then feel free to resume.  I have provided you with a prescription.     Medrol Dosepak (methylprednisolone): This is a steroid that will significantly calm your upper and lower airways, please take one row of tablets daily with your breakfast meal starting tomorrow morning until the prescription is complete.      If you find that you have not had improvement of your symptoms in the next 5 to 7 days, please follow-up with your primary care provider or return here to urgent care for repeat evaluation and further recommendations.   Thank you for visiting urgent care today.  We appreciate the opportunity to participate in your care.

## 2021-12-31 NOTE — ED Provider Notes (Signed)
[facility]    CSN: [id number] Arrival date & time: 12/31/21  4098    HISTORY   Chief Complaint  Patient presents with   Cough    Stuffy nose - Entered by patient   Nasal Congestion   Sore Throat   HPI Sharon Waller is a pleasant, 26 y.o. female who presents to urgent care today. Patient complains of nasal congestion, postnasal drip and nonproductive cough all of which are worse at night.  Patient reports a history of allergies, has been taking Zyrtec which she she states has been prescribed to her in the past.  Patient states She is also been taking Flonase daily and Mucinex several times a day with no meaningful relief of her symptoms.  Patient denies fever, aches, chills, nausea, vomiting, diarrhea, known sick contacts.  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  The history is provided by the patient.   Past Medical History:  Diagnosis Date   Abdominal pain affecting pregnancy, antepartum 09/26/2015   [redacted]w[redacted]d on 09/23/15    Allergic rhinitis 07/11/2018   Asthma    Bacterial vaginosis    Bronchitis    Choroid plexus cyst of fetus 01/07/2016   Mild intermittent asthma 07/11/2018   SVD (spontaneous vaginal delivery) 05/04/2016   UTI (lower urinary tract infection)    Patient Active Problem List   Diagnosis Date Noted   Miscarriage 09/27/2021   Dysuria 07/20/2021   Alpha thalassemia silent carrier 05/04/2021   Palpitations 07/17/2018   SOB (shortness of breath) 07/17/2018   Chest pain of uncertain etiology 07/17/2018   Mild intermittent asthma 07/11/2018   Allergic rhinitis 07/11/2018   Past Surgical History:  Procedure Laterality Date   NO PAST SURGERIES     OB History     Gravida  5   Para  1   Term  1   Preterm  0   AB  2   Living  1      SAB  0   IAB  1   Ectopic  0   Multiple  0   Live Births  1          Home Medications    Prior to Admission medications   Medication Sig Start Date End Date  Taking? Authorizing Provider  cetirizine (ZYRTEC ALLERGY) 10 MG tablet Take 1 tablet (10 mg total) by mouth at bedtime. 06/16/21 09/14/21  Theadora Rama Scales, PA-C    Family History Family History  Problem Relation Age of Onset   Migraines Mother    Allergic rhinitis Father    Asthma Brother        as child   Cancer Maternal Aunt    Breast cancer Maternal Aunt    Cancer Maternal Grandmother    Breast cancer Maternal Grandmother    Allergic rhinitis Paternal Grandmother    Allergic rhinitis Paternal Grandfather    Social History Social History   Tobacco Use   Smoking status: Former    Types: Cigarettes    Quit date: 09/30/2015    Years since quitting: 6.2   Smokeless tobacco: Never  Vaping Use   Vaping Use: Never used  Substance Use Topics   Alcohol use: Yes    Comment: social   Drug use: No   Allergies   Augmentin [amoxicillin-pot clavulanate]  Review of Systems Review of Systems Pertinent findings revealed after performing a 14 point review of systems has been noted in the history of present illness.  Physical  Exam Triage Vital Signs ED Triage Vitals  Enc Vitals Group     BP 01/08/21 0827 (!) 147/82     Pulse Rate 01/08/21 0827 72     Resp 01/08/21 0827 18     Temp 01/08/21 0827 98.3 F (36.8 C)     Temp Source 01/08/21 0827 Oral     SpO2 01/08/21 0827 98 %     Weight --      Height --      Head Circumference --      Peak Flow --      Pain Score 01/08/21 0826 5     Pain Loc --      Pain Edu? --      Excl. in [place]? --   No data found.  Updated Vital Signs BP 113/79 (BP Location: Right Arm)   Pulse (!) 51   Temp 98.8 F (37.1 C) (Oral)   Resp 16   LMP 12/20/2021   SpO2 95%   Breastfeeding No   Physical Exam Vitals and nursing note reviewed.  Constitutional:      General: She is not in acute distress.    Appearance: Normal appearance. She is not ill-appearing.  HENT:     Head: Normocephalic and atraumatic.     Salivary Glands: Right salivary  gland is not diffusely enlarged or tender. Left salivary gland is not diffusely enlarged or tender.     Right Ear: Ear canal and external ear normal. No drainage. A middle ear effusion is present. There is no impacted cerumen. Tympanic membrane is bulging. Tympanic membrane is not injected or erythematous.     Left Ear: Ear canal and external ear normal. No drainage. A middle ear effusion is present. There is no impacted cerumen. Tympanic membrane is bulging. Tympanic membrane is not injected or erythematous.     Ears:     Comments: Bilateral EACs normal, both TMs bulging with clear fluid    Nose: Rhinorrhea present. No nasal deformity, septal deviation, signs of injury, nasal tenderness, mucosal edema or congestion. Rhinorrhea is clear.     Right Nostril: Occlusion present. No foreign body, epistaxis or septal hematoma.     Left Nostril: Occlusion present. No foreign body, epistaxis or septal hematoma.     Right Turbinates: Enlarged, swollen and pale.     Left Turbinates: Enlarged, swollen and pale.     Right Sinus: No maxillary sinus tenderness or frontal sinus tenderness.     Left Sinus: No maxillary sinus tenderness or frontal sinus tenderness.     Mouth/Throat:     Lips: Pink. No lesions.     Mouth: Mucous membranes are moist. No oral lesions.     Pharynx: Oropharynx is clear. Uvula midline. No posterior oropharyngeal erythema or uvula swelling.     Tonsils: No tonsillar exudate. 0 on the right. 0 on the left.     Comments: Postnasal drip Eyes:     General: Lids are normal.        Right eye: No discharge.        Left eye: No discharge.     Extraocular Movements: Extraocular movements intact.     Conjunctiva/sclera: Conjunctivae normal.     Right eye: Right conjunctiva is not injected.     Left eye: Left conjunctiva is not injected.  Neck:     Trachea: Trachea and phonation normal.  Cardiovascular:     Rate and Rhythm: Normal rate and regular rhythm.     Pulses: Normal pulses.  Heart sounds: Normal heart sounds. No murmur heard.    No friction rub. No gallop.  Pulmonary:     Effort: Pulmonary effort is normal. No accessory muscle usage, prolonged expiration or respiratory distress.     Breath sounds: No stridor, decreased air movement or transmitted upper airway sounds. Examination of the right-upper field reveals decreased breath sounds. Examination of the left-upper field reveals decreased breath sounds. Examination of the right-middle field reveals decreased breath sounds. Examination of the left-middle field reveals decreased breath sounds. Examination of the right-lower field reveals decreased breath sounds. Examination of the left-lower field reveals decreased breath sounds. Decreased breath sounds present. No wheezing, rhonchi or rales.  Chest:     Chest wall: No tenderness.  Musculoskeletal:        General: Normal range of motion.     Cervical back: Normal range of motion and neck supple. Normal range of motion.  Lymphadenopathy:     Cervical: No cervical adenopathy.  Skin:    General: Skin is warm and dry.     Findings: No erythema or rash.  Neurological:     General: No focal deficit present.     Mental Status: She is alert and oriented to person, place, and time.  Psychiatric:        Mood and Affect: Mood normal.        Behavior: Behavior normal.     Visual Acuity Right Eye Distance:   Left Eye Distance:   Bilateral Distance:    Right Eye Near:   Left Eye Near:    Bilateral Near:     UC Couse / Diagnostics / Procedures:     Radiology No results found.  Procedures Procedures (including critical care time) EKG  Pending results:  Labs Reviewed - No data to display  Medications Ordered in UC: Medications  albuterol (PROVENTIL) (2.5 MG/3ML) 0.083% nebulizer solution 2.5 mg (2.5 mg Nebulization Given 12/31/21 1913)    UC Diagnoses / Final Clinical Impressions(s)   I have reviewed the triage vital signs and the nursing  notes.  Pertinent labs & imaging results that were available during my care of the patient were reviewed by me and considered in my medical decision making (see chart for details).    Final diagnoses:  Seasonal allergic rhinitis, unspecified trigger  Allergic disorder of respiratory system, initial encounter   Patient stated she did not have improved work of breathing after nebulized albuterol treatment, auscultation did not reveal improved breath sounds.  For this reason, patient was not provided with an albuterol inhaler.  Instead, I provided her with a steroid to help resolve her upper respiratory inflammation secondary to respiratory allergies and have advised her to continue Flonase and Zyrtec.  Patient also provided with Atrovent nasal spray for postnasal drip to help reduce nighttime cough.  Patient provided with Promethazine DM also for nighttime cough.  Return precautions advised.  ED Prescriptions     Medication Sig Dispense Auth. Provider   methylPREDNISolone (MEDROL DOSEPAK) 4 MG TBPK tablet Take 24 mg on day 1, 20 mg on day 2, 16 mg on day 3, 12 mg on day 4, 8 mg on day 5, 4 mg on day 6.  Take all tablets in each row at once, do not spread tablets out throughout the day. 21 tablet Theadora Rama Scales, PA-C   ipratropium (ATROVENT) 0.06 % nasal spray Place 2 sprays into both nostrils 3 (three) times daily. As needed for nasal congestion, runny nose 15 mL Theadora Rama Scales,  PA-C   fluticasone (FLONASE) 50 MCG/ACT nasal spray Place 1 spray into both nostrils daily. 47.4 mL Theadora RamaMorgan, Konstance Happel Scales, PA-C   promethazine-dextromethorphan (PROMETHAZINE-DM) 6.25-15 MG/5ML syrup Take 5 mLs by mouth 4 (four) times daily as needed for cough. 118 mL Theadora RamaMorgan, Kenidy Crossland Scales, PA-C      PDMP not reviewed this encounter.  Disposition Upon Discharge:  Condition: stable for discharge home Home: take medications as prescribed; routine discharge instructions as discussed; follow up as  advised.  Patient presented with an acute illness with associated systemic symptoms and significant discomfort requiring urgent management. In my opinion, this is a condition that a prudent lay person (someone who possesses an average knowledge of health and medicine) may potentially expect to result in complications if not addressed urgently such as respiratory distress, impairment of bodily function or dysfunction of bodily organs.   Routine symptom specific, illness specific and/or disease specific instructions were discussed with the patient and/or caregiver at length.   As such, the patient has been evaluated and assessed, work-up was performed and treatment was provided in alignment with urgent care protocols and evidence based medicine.  Patient/parent/caregiver has been advised that the patient may require follow up for further testing and treatment if the symptoms continue in spite of treatment, as clinically indicated and appropriate.  If the patient was tested for COVID-19, Influenza and/or RSV, then the patient/parent/guardian was advised to isolate at home pending the results of his/her diagnostic coronavirus test and potentially longer if they're positive. I have also advised pt that if his/her COVID-19 test returns positive, it's recommended to self-isolate for at least 10 days after symptoms first appeared AND until fever-free for 24 hours without fever reducer AND other symptoms have improved or resolved. Discussed self-isolation recommendations as well as instructions for household member/close contacts as per the [facility] and [place] DHHS, and also gave patient the COVID packet with this information.  Patient/parent/caregiver has been [facility] in 3-5 days if no better; to PCP or the Emergency Department if new signs and symptoms develop, or if the current signs or symptoms continue to change or worsen for further workup, evaluation and treatment as clinically indicated  and appropriate  The patient will follow up with their current PCP if and as advised. If the patient does not currently have a PCP we will assist them in obtaining one.   The patient may need specialty follow up if the symptoms continue, in spite of conservative treatment and management, for further workup, evaluation, consultation and treatment as clinically indicated and appropriate.  Patient/parent/caregiver verbalized understanding and agreement of plan as discussed.  All questions were addressed during visit.  Please see discharge instructions below for further details of plan.  Discharge Instructions:   Discharge Instructions      Your symptoms and my physical exam findings are concerning for exacerbation of your underlying allergies.     Please see the list below for recommended medications, dosages and frequencies to provide relief of current symptoms:     Zyrtec (cetirizine): This is an excellent second-generation antihistamine that helps to reduce respiratory inflammatory response to environmental allergens.  In some patients, this medication can cause daytime sleepiness so I recommend that you take 1 tablet daily at bedtime.    Flonase (fluticasone): This is a steroid nasal spray that you use once daily, 1 spray in each nare.  This medication does not work well if you decide to use it only used as you  feel you need to, it works best used on a daily basis.  After 3 to 5 days of use, you will notice significant reduction of the inflammation and mucus production that is currently being caused by exposure to allergens, whether seasonal or environmental.  The most common side effect of this medication is nosebleeds.  If you experience a nosebleed, please discontinue use for 1 week, then feel free to resume.  I have provided you with a prescription.     Medrol Dosepak (methylprednisolone): This is a steroid that will significantly calm your upper and lower airways, please take one row of  tablets daily with your breakfast meal starting tomorrow morning until the prescription is complete.      If you find that you have not had improvement of your symptoms in the next 5 to 7 days, please follow-up with your primary care provider or return here to urgent care for repeat evaluation and further recommendations.   Thank you for visiting urgent care today.  We appreciate the opportunity to participate in your care.         This office note has been dictated using Teaching laboratory technician.  Unfortunately, this method of dictation can sometimes lead to typographical or grammatical errors.  I apologize for your inconvenience in advance if this occurs.  Please do not hesitate to reach out to me if clarification is needed.      Theadora Rama Scales, PA-C 12/31/21 1929

## 2022-01-04 ENCOUNTER — Ambulatory Visit: Payer: Medicaid Other

## 2022-01-10 ENCOUNTER — Ambulatory Visit
Admission: RE | Admit: 2022-01-10 | Discharge: 2022-01-10 | Disposition: A | Discharge status: Home / Self Care | Payer: Medicaid Other | Source: Ambulatory Visit | Attending: Emergency Medicine | Admitting: Emergency Medicine

## 2022-01-10 VITALS — BP 113/77 | HR 85 | Temp 99.0°F | Resp 16

## 2022-01-10 DIAGNOSIS — N760 Acute vaginitis: Secondary | ICD-10-CM | POA: Diagnosis not present

## 2022-01-10 DIAGNOSIS — Z113 Encounter for screening for infections with a predominantly sexual mode of transmission: Secondary | ICD-10-CM | POA: Insufficient documentation

## 2022-01-10 LAB — POCT URINALYSIS DIP (MANUAL ENTRY)
Bilirubin, UA: NEGATIVE
Blood, UA: NEGATIVE
Glucose, UA: NEGATIVE mg/dL
Ketones, POC UA: NEGATIVE mg/dL
Leukocytes, UA: NEGATIVE
Nitrite, UA: NEGATIVE
Protein Ur, POC: 30 mg/dL — AB
Spec Grav, UA: >=1.03 — AB (ref 1.010–1.025)
Urobilinogen, UA: 0.2 E.U./dL
pH, UA: 6 (ref 5.0–8.0)

## 2022-01-10 LAB — POCT URINE PREGNANCY: Preg Test, Ur: NEGATIVE

## 2022-01-10 NOTE — Discharge Instructions (Addendum)
The results of your vaginal swab test which screens for BV, yeast, gonorrhea, chlamydia and trichomonas will be made posted to your MyChart account once it is complete.  This typically takes 2 to 4 days.  Please abstain from sexual intercourse of any kind, vaginal, oral or anal, until you have received the results of your STD testing.     If any of your results are abnormal, you will receive a phone call regarding treatment.  Prescriptions, if any are needed, will be provided for you at your pharmacy.     Your urine pregnancy test today is negative.   Our point-of-care analysis of your urine sample today was normal and did not reveal any concern for urinary tract infection.   The results of your vaginal swab test which tests for BV, yeast, gonorrhea, chlamydia and trichomonas will be posted to your MyChart account in the next 3 to 5 days.  If any of your results are abnormal, you will receive a phone call regarding further treatment.  Additional prescriptions, if any are needed, will be provided for you at your pharmacy.   Please abstain from sexual intercourse of any kind, vaginal, oral or anal, until until you have received the results of your test.   Thank you for visiting urgent care today.  I appreciate the opportunity to participate in your care.

## 2022-01-10 NOTE — ED Provider Notes (Signed)
[facility]    CSN: [id number] Arrival date & time: 01/10/22  1514    HISTORY   Chief Complaint  Patient presents with   Vaginal Discharge    I also would like to get std testing - Entered by patient   Back Pain   HPI Sharon Waller is a pleasant, 26 y.o. female who presents to urgent care today. Pt reports having lower back pain, vaginal discharge and vaginal irritation x 1 week.  Patient states has been taking Tylenol without meaningful relief of her symptoms.  Patient endorses abnormal vaginal discharge and vaginal irritation.  Patient denies burning with urination, increased frequency of urination, sensation of incomplete emptying, suprapubic pain, perineal pain, incontinence of urine, flank pain, fever, chills, malaise, rigors, significant fatigue, vaginal itching, dyspareunia, genital lesion(s), and possible exposure to STD.     The history is provided by the patient.   Past Medical History:  Diagnosis Date   Abdominal pain affecting pregnancy, antepartum 09/26/2015   [redacted]w[redacted]d on 09/23/15    Allergic rhinitis 07/11/2018   Asthma    Bacterial vaginosis    Bronchitis    Choroid plexus cyst of fetus 01/07/2016   Mild intermittent asthma 07/11/2018   SVD (spontaneous vaginal delivery) 05/04/2016   UTI (lower urinary tract infection)    Patient Active Problem List   Diagnosis Date Noted   Miscarriage 09/27/2021   Dysuria 07/20/2021   Alpha thalassemia silent carrier 05/04/2021   Palpitations 07/17/2018   SOB (shortness of breath) 07/17/2018   Chest pain of uncertain etiology 07/17/2018   Mild intermittent asthma 07/11/2018   Allergic rhinitis 07/11/2018   Past Surgical History:  Procedure Laterality Date   NO PAST SURGERIES     OB History     Gravida  5   Para  1   Term  1   Preterm  0   AB  2   Living  1      SAB  0   IAB  1   Ectopic  0   Multiple  0   Live Births  1          Home Medications    Prior to Admission medications    Medication Sig Start Date End Date Taking? Authorizing Provider  cetirizine (ZYRTEC ALLERGY) 10 MG tablet Take 1 tablet (10 mg total) by mouth at bedtime. 06/16/21 09/14/21  Theadora Rama Scales, PA-C  fluticasone (FLONASE) 50 MCG/ACT nasal spray Place 1 spray into both nostrils daily. 12/31/21   Theadora Rama Scales, PA-C  ipratropium (ATROVENT) 0.06 % nasal spray Place 2 sprays into both nostrils 3 (three) times daily. As needed for nasal congestion, runny nose 12/31/21   Theadora Rama Scales, PA-C    Family History Family History  Problem Relation Age of Onset   Migraines Mother    Allergic rhinitis Father    Asthma Brother        as child   Cancer Maternal Aunt    Breast cancer Maternal Aunt    Cancer Maternal Grandmother    Breast cancer Maternal Grandmother    Allergic rhinitis Paternal Grandmother    Allergic rhinitis Paternal Grandfather    Social History Social History   Tobacco Use   Smoking status: Former    Types: Cigarettes    Quit date: 09/30/2015    Years since quitting: 6.2   Smokeless tobacco: Never  Vaping Use   Vaping Use: Never used  Substance Use Topics   Alcohol use: Yes  Comment: social   Drug use: No   Allergies   Augmentin [amoxicillin-pot clavulanate]  Review of Systems Review of Systems Pertinent findings revealed after performing a 14 point review of systems has been noted in the history of present illness.  Physical Exam Triage Vital Signs ED Triage Vitals  Enc Vitals Group     BP 01/08/21 0827 (!) 147/82     Pulse Rate 01/08/21 0827 72     Resp 01/08/21 0827 18     Temp 01/08/21 0827 98.3 F (36.8 C)     Temp Source 01/08/21 0827 Oral     SpO2 01/08/21 0827 98 %     Weight --      Height --      Head Circumference --      Peak Flow --      Pain Score 01/08/21 0826 5     Pain Loc --      Pain Edu? --      Excl. in GC? --   No data found.  Updated Vital Signs BP 113/77 (BP Location: Right Arm)   Pulse 85   Temp 99 F  (37.2 C) (Oral)   Resp 16   LMP 12/20/2021   SpO2 96%   Physical Exam Vitals and nursing note reviewed.  Constitutional:      General: She is not in acute distress.    Appearance: Normal appearance. She is not ill-appearing.  HENT:     Head: Normocephalic and atraumatic.  Eyes:     General: Lids are normal.        Right eye: No discharge.        Left eye: No discharge.     Extraocular Movements: Extraocular movements intact.     Conjunctiva/sclera: Conjunctivae normal.     Right eye: Right conjunctiva is not injected.     Left eye: Left conjunctiva is not injected.  Neck:     Trachea: Trachea and phonation normal.  Cardiovascular:     Rate and Rhythm: Normal rate and regular rhythm.     Pulses: Normal pulses.     Heart sounds: Normal heart sounds. No murmur heard.    No friction rub. No gallop.  Pulmonary:     Effort: Pulmonary effort is normal. No accessory muscle usage, prolonged expiration or respiratory distress.     Breath sounds: Normal breath sounds. No stridor, decreased air movement or transmitted upper airway sounds. No decreased breath sounds, wheezing, rhonchi or rales.  Chest:     Chest wall: No tenderness.  Genitourinary:    Comments: Patient politely declines pelvic exam today, patient provided a vaginal swab for testing. Musculoskeletal:        General: Normal range of motion.     Cervical back: Normal range of motion and neck supple. Normal range of motion.  Lymphadenopathy:     Cervical: No cervical adenopathy.  Skin:    General: Skin is warm and dry.     Findings: No erythema or rash.  Neurological:     General: No focal deficit present.     Mental Status: She is alert and oriented to person, place, and time.  Psychiatric:        Mood and Affect: Mood normal.        Behavior: Behavior normal.     Visual Acuity Right Eye Distance:   Left Eye Distance:   Bilateral Distance:    Right Eye Near:   Left Eye Near:    Bilateral Near:  UC  Couse / Diagnostics / Procedures:     Radiology No results found.  Procedures Procedures (including critical care time) EKG  Pending results:  Labs Reviewed  POCT URINALYSIS DIP (MANUAL ENTRY) - Abnormal; Notable for the following components:      Result Value   Spec Grav, UA >=1.030 (*)    Protein Ur, POC =30 (*)    All other components within normal limits  POCT URINE PREGNANCY  CERVICOVAGINAL ANCILLARY ONLY    Medications Ordered in UC: Medications - No data to display  UC Diagnoses / Final Clinical Impressions(s)   I have reviewed the triage vital signs and the nursing notes.  Pertinent labs & imaging results that were available during my care of the patient were reviewed by me and considered in my medical decision making (see chart for details).    Final diagnoses:  Screening examination for STD (sexually transmitted disease)  Acute vaginitis    {LMSTDP:27058}  {LMUTIP:27060}  ED Prescriptions   None    PDMP not reviewed this encounter.  Disposition Upon Discharge:  Condition: stable for discharge home  Patient presented with concern for an acute illness with associated systemic symptoms and significant discomfort requiring urgent management. In my opinion, this is a condition that a prudent lay person (someone who possesses an average knowledge of health and medicine) may potentially expect to result in complications if not addressed urgently such as respiratory distress, impairment of bodily function or dysfunction of bodily organs.   As such, the patient has been evaluated and assessed, work-up was performed and treatment was provided in alignment with urgent care protocols and evidence based medicine.  Patient/parent/caregiver has been advised that the patient may require follow up for further testing and/or treatment if the symptoms continue in spite of treatment, as clinically indicated and appropriate.  Routine symptom specific, illness specific and/or  disease specific instructions were discussed with the patient and/or caregiver at length.  Prevention strategies for avoiding STD exposure were also discussed.  The patient will follow up with their current PCP if and as advised. If the patient does not currently have a PCP we will assist them in obtaining one.   The patient may need specialty follow up if the symptoms continue, in spite of conservative treatment and management, for further workup, evaluation, consultation and treatment as clinically indicated and appropriate.  Patient/parent/caregiver verbalized understanding and agreement of plan as discussed.  All questions were addressed during visit.  Please see discharge instructions below for further details of plan.  Discharge Instructions:   Discharge Instructions      The results of your vaginal swab test which screens for BV, yeast, gonorrhea, chlamydia and trichomonas will be made posted to your MyChart account once it is complete.  This typically takes 2 to 4 days.  Please abstain from sexual intercourse of any kind, vaginal, oral or anal, until you have received the results of your STD testing.     If any of your results are abnormal, you will receive a phone call regarding treatment.  Prescriptions, if any are needed, will be provided for you at your pharmacy.     Your urine pregnancy test today is negative.   Our point-of-care analysis of your urine sample today was normal and did not reveal any concern for urinary tract infection.   The results of your vaginal swab test which tests for BV, yeast, gonorrhea, chlamydia and trichomonas will be posted to your MyChart account in the next 3 to  5 days.  If any of your results are abnormal, you will receive a phone call regarding further treatment.  Additional prescriptions, if any are needed, will be provided for you at your pharmacy.   Please abstain from sexual intercourse of any kind, vaginal, oral or anal, until until you have  received the results of your test.   Thank you for visiting urgent care today.  I appreciate the opportunity to participate in your care.       This office note has been dictated using Museum/gallery curator.  Unfortunately, this method of dictation can sometimes lead to typographical or grammatical errors.  I apologize for your inconvenience in advance if this occurs.  Please do not hesitate to reach out to me if clarification is needed.

## 2022-01-10 NOTE — ED Triage Notes (Signed)
Pt reports having lower back pain, vaginal discharge and vaginal irritation x 1 week.  Home interventions: tylenol

## 2022-01-11 ENCOUNTER — Telehealth (HOSPITAL_COMMUNITY): Payer: Self-pay | Admitting: Emergency Medicine

## 2022-01-11 LAB — CERVICOVAGINAL ANCILLARY ONLY
Bacterial Vaginitis (gardnerella): POSITIVE — AB
Candida Glabrata: NEGATIVE
Candida Vaginitis: NEGATIVE
Chlamydia: NEGATIVE
Comment: NEGATIVE
Comment: NEGATIVE
Comment: NEGATIVE
Comment: NEGATIVE
Comment: NEGATIVE
Comment: NORMAL
Neisseria Gonorrhea: NEGATIVE
Trichomonas: NEGATIVE

## 2022-01-11 MED ORDER — METRONIDAZOLE 500 MG PO TABS: 500.0000 mg | ORAL_TABLET | Freq: Two times a day (BID) | ORAL | 0 refills | Status: DC

## 2022-01-31 ENCOUNTER — Ambulatory Visit
Admission: RE | Admit: 2022-01-31 | Discharge: 2022-01-31 | Disposition: A | Discharge status: Home / Self Care | Payer: Medicaid Other | Source: Ambulatory Visit | Attending: Urgent Care | Admitting: Urgent Care

## 2022-01-31 VITALS — BP 110/74 | HR 62 | Temp 98.4°F | Resp 16

## 2022-01-31 DIAGNOSIS — R300 Dysuria: Secondary | ICD-10-CM | POA: Diagnosis not present

## 2022-01-31 DIAGNOSIS — N898 Other specified noninflammatory disorders of vagina: Secondary | ICD-10-CM | POA: Insufficient documentation

## 2022-01-31 DIAGNOSIS — Z202 Contact with and (suspected) exposure to infections with a predominantly sexual mode of transmission: Secondary | ICD-10-CM | POA: Diagnosis not present

## 2022-01-31 DIAGNOSIS — R350 Frequency of micturition: Secondary | ICD-10-CM | POA: Diagnosis not present

## 2022-01-31 DIAGNOSIS — A599 Trichomoniasis, unspecified: Secondary | ICD-10-CM | POA: Insufficient documentation

## 2022-01-31 LAB — POCT URINALYSIS DIP (MANUAL ENTRY)
Bilirubin, UA: NEGATIVE
Blood, UA: NEGATIVE
Glucose, UA: NEGATIVE mg/dL
Nitrite, UA: NEGATIVE
Protein Ur, POC: 30 mg/dL — AB
Spec Grav, UA: >=1.03 — AB (ref 1.010–1.025)
Urobilinogen, UA: 1 E.U./dL
pH, UA: 6 (ref 5.0–8.0)

## 2022-01-31 LAB — POCT URINE PREGNANCY: Preg Test, Ur: NEGATIVE

## 2022-01-31 MED ORDER — METRONIDAZOLE 500 MG PO TABS: 500.0000 mg | ORAL_TABLET | Freq: Two times a day (BID) | ORAL | 0 refills | Status: DC

## 2022-01-31 MED ORDER — FLUCONAZOLE 150 MG PO TABS: 150.0000 mg | ORAL_TABLET | ORAL | 0 refills | Status: DC

## 2022-01-31 NOTE — ED Triage Notes (Signed)
Pt reports + STD exposure-c/o vaginal d/c, urinary freq and slight dysuria x 3 days-NAD-steady gait

## 2022-01-31 NOTE — ED Provider Notes (Signed)
[facility]  Note:  This document was prepared using Conservation officer, historic buildings and may include unintentional dictation errors.  MRN: [id number] DOB: 1995-03-30  Subjective:   Sharon Waller is a 26 y.o. female presenting for 3 day history of acute onset vaginal discharge, mild dysuria, urinary frequency.  She is sexually active with 1 female partner who ended up testing positive for trichomoniasis.  Has a history of BV infections and also gets yeast infections when she takes antibiotics.  Admits that she does not hydrate very well with water.  Drinks sodas multiple times daily.  No current facility-administered medications for this encounter.  Current Outpatient Medications:    cetirizine (ZYRTEC ALLERGY) 10 MG tablet, Take 1 tablet (10 mg total) by mouth at bedtime., Disp: 30 tablet, Rfl: 2   fluticasone (FLONASE) 50 MCG/ACT nasal spray, Place 1 spray into both nostrils daily., Disp: 47.4 mL, Rfl: 1   ipratropium (ATROVENT) 0.06 % nasal spray, Place 2 sprays into both nostrils 3 (three) times daily. As needed for nasal congestion, runny nose, Disp: 15 mL, Rfl: 1   metroNIDAZOLE (FLAGYL) 500 MG tablet, Take 1 tablet (500 mg total) by mouth 2 (two) times daily., Disp: 14 tablet, Rfl: 0   Allergies  Allergen Reactions   Augmentin [Amoxicillin-Pot Clavulanate] Rash    Has patient had a PCN reaction causing immediate rash, facial/tongue/throat swelling, SOB or lightheadedness with hypotension: Yes Has patient had a PCN reaction causing severe rash involving mucus membranes or skin necrosis: No Has patient had a PCN reaction that required hospitalization Yes Has patient had a PCN reaction occurring within the last 10 years: No If all of the above answers are "NO", then may proceed with Cephalosporin use.     Past Medical History:  Diagnosis Date   Abdominal pain affecting pregnancy, antepartum 09/26/2015   [redacted]w[redacted]d on 09/23/15    Allergic rhinitis 07/11/2018    Asthma    Bacterial vaginosis    Bronchitis    Choroid plexus cyst of fetus 01/07/2016   Mild intermittent asthma 07/11/2018   SVD (spontaneous vaginal delivery) 05/04/2016   UTI (lower urinary tract infection)      Past Surgical History:  Procedure Laterality Date   NO PAST SURGERIES      Family History  Problem Relation Age of Onset   Migraines Mother    Allergic rhinitis Father    Asthma Brother        as child   Cancer Maternal Aunt    Breast cancer Maternal Aunt    Cancer Maternal Grandmother    Breast cancer Maternal Grandmother    Allergic rhinitis Paternal Grandmother    Allergic rhinitis Paternal Grandfather     Social History   Tobacco Use   Smoking status: Never   Smokeless tobacco: Never  Vaping Use   Vaping Use: Never used  Substance Use Topics   Alcohol use: Yes    Comment: social   Drug use: No    ROS   Objective:   Vitals: BP 110/74 (BP Location: Right Arm)   Pulse 62   Temp 98.4 F (36.9 C) (Oral)   Resp 16   LMP 01/18/2022   SpO2 99%   Physical Exam Constitutional:      General: She is not in acute distress.    Appearance: Normal appearance. She is well-developed. She is not ill-appearing, toxic-appearing or diaphoretic.  HENT:     Head: Normocephalic and atraumatic.     Nose: Nose  normal.     Mouth/Throat:     Mouth: Mucous membranes are moist.  Eyes:     General: No scleral icterus.       Right eye: No discharge.        Left eye: No discharge.     Extraocular Movements: Extraocular movements intact.  Cardiovascular:     Rate and Rhythm: Normal rate.  Pulmonary:     Effort: Pulmonary effort is normal.  Skin:    General: Skin is warm and dry.  Neurological:     General: No focal deficit present.     Mental Status: She is alert and oriented to person, place, and time.  Psychiatric:        Mood and Affect: Mood normal.        Behavior: Behavior normal.     Results for orders placed or performed during the hospital  encounter of 01/31/22 (from the past 24 hour(s))  POCT urinalysis dipstick     Status: Abnormal   Collection Time: 01/31/22  7:04 PM  Result Value Ref Range   Color, UA yellow yellow   Clarity, UA cloudy (A) clear   Glucose, UA negative negative mg/dL   Bilirubin, UA negative negative   Ketones, POC UA moderate (40) (A) negative mg/dL   Spec Grav, UA >=1.030 (A) 1.010 - 1.025   Blood, UA negative negative   pH, UA 6.0 5.0 - 8.0   Protein Ur, POC =30 (A) negative mg/dL   Urobilinogen, UA 1.0 0.2 or 1.0 E.U./dL   Nitrite, UA Negative Negative   Leukocytes, UA Trace (A) Negative  POCT urine pregnancy     Status: None   Collection Time: 01/31/22  7:05 PM  Result Value Ref Range   Preg Test, Ur Negative Negative    Assessment and Plan :   PDMP not reviewed this encounter.  1. Trichomonas infection   2. Exposure to trichomonas   3. Vaginal discharge   4. Urinary frequency   5. Dysuria     Recommended empiric treatment for trichomoniasis given her exposure.  Start metronidazole, use oral fluconazole to address a secondary yeast infection from antibiotic use.  Low suspicion for an urinary tract infection given the urinalysis.  Emphasized need to hydrate much better with plain water and avoid urinary irritants.  Counseled patient on potential for adverse effects with medications prescribed/recommended today, ER and return-to-clinic precautions discussed, patient verbalized understanding.    Jaynee Eagles, [place] 01/31/22 1913

## 2022-01-31 NOTE — Discharge Instructions (Addendum)
Please start metronidazole to address the trichomonas exposure. Use fluconazole to treat a yeast infection from an antibiotic. Make sure you hydrate very well with plain water and a quantity of 64 ounces of water a day.  Please limit drinks that are considered urinary irritants such as soda, sweet tea, coffee, energy drinks, alcohol.  These can worsen your urinary and genital symptoms but also be the source of them.  I will let you know about your urine culture and vaginal swab results through MyChart to see if we need to prescribe or change your antibiotics based off of those results.

## 2022-02-02 LAB — CERVICOVAGINAL ANCILLARY ONLY
Bacterial Vaginitis (gardnerella): POSITIVE — AB
Candida Glabrata: NEGATIVE
Candida Vaginitis: NEGATIVE
Chlamydia: NEGATIVE
Comment: NEGATIVE
Comment: NEGATIVE
Comment: NEGATIVE
Comment: NEGATIVE
Comment: NEGATIVE
Comment: NORMAL
Neisseria Gonorrhea: NEGATIVE
Trichomonas: NEGATIVE

## 2022-03-09 ENCOUNTER — Encounter (HOSPITAL_COMMUNITY): Payer: Self-pay

## 2022-03-09 ENCOUNTER — Ambulatory Visit (HOSPITAL_COMMUNITY)
Admission: RE | Admit: 2022-03-09 | Discharge: 2022-03-09 | Disposition: A | Discharge status: Home / Self Care | Payer: Medicaid Other | Source: Ambulatory Visit | Attending: Emergency Medicine | Admitting: Emergency Medicine

## 2022-03-09 VITALS — BP 105/72 | HR 107 | Temp 102.1°F | Resp 16

## 2022-03-09 DIAGNOSIS — J069 Acute upper respiratory infection, unspecified: Secondary | ICD-10-CM

## 2022-03-09 MED ORDER — ACETAMINOPHEN 325 MG PO TABS
650.0000 mg | ORAL_TABLET | Freq: Once | ORAL | Status: AC
  Administered 2022-03-09: 650 mg via ORAL

## 2022-03-09 MED ORDER — CEFDINIR 300 MG PO CAPS: 300.0000 mg | ORAL_CAPSULE | Freq: Two times a day (BID) | ORAL | 0 refills | Status: AC

## 2022-03-09 MED ORDER — ACETAMINOPHEN 325 MG PO TABS
ORAL_TABLET | ORAL | Status: AC
  Filled 2022-03-09: qty 2

## 2022-03-09 MED ORDER — CYCLOBENZAPRINE HCL 5 MG PO TABS: 5.0000 mg | ORAL_TABLET | Freq: Three times a day (TID) | ORAL | 0 refills | Status: DC | PRN

## 2022-03-09 MED ORDER — PROMETHAZINE-DM 6.25-15 MG/5ML PO SYRP: 5.0000 mL | ORAL_SOLUTION | Freq: Every evening | ORAL | 0 refills | Status: DC | PRN

## 2022-03-09 NOTE — Discharge Instructions (Signed)
Your symptoms today are most likely being caused by a virus and should steadily improve in time it can take up to 7 to 10 days before you truly start to see a turnaround however things will get better  Begin cefdinir every morning and every evening to provide coverage for bacteria as you are still having high fevers  You may use Flexeril every 8 hours as needed to help with body aches, be mindful this will make you drowsy  May use cough syrup at bedtime to help with coughing and allow rest  You can take Tylenol 500 to 1000 mg every 6 hours and/or Ibuprofen 600 to 800 mg every 6-8 hours as needed for fever reduction and pain relief.   For cough: honey 1/2 to 1 teaspoon (you can dilute the honey in water or another fluid).  You can also use guaifenesin and dextromethorphan for cough. You can use a humidifier for chest congestion and cough.  If you don't have a humidifier, you can sit in the bathroom with the hot shower running.      For sore throat: try warm salt water gargles, cepacol lozenges, throat spray, warm tea or water with lemon/honey, popsicles or ice, or OTC cold relief medicine for throat discomfort.   For congestion: take a daily anti-histamine like Zyrtec, Claritin, and a oral decongestant, such as pseudoephedrine.  You can also use Flonase 1-2 sprays in each nostril daily.   It is important to stay hydrated: drink plenty of fluids (water, gatorade/powerade/pedialyte, juices, or teas) to keep your throat moisturized and help further relieve irritation/discomfort.

## 2022-03-09 NOTE — ED Triage Notes (Signed)
Pt presents with c/o generalized body aches, cough, runny nose and weakness X 1 week.   Pt states she has been taking mucinex and theraflu.States it has not helped.

## 2022-03-09 NOTE — ED Provider Notes (Signed)
[facility]    CSN: [id number] Arrival date & time: 03/09/22  1802      History   Chief Complaint Chief Complaint  Patient presents with   Appointment    HPI Sharon Waller is a 26 y.o. female.   Patient presents for evaluation of fever, chills, body aches, malaise and increased fatigue, nasal congestion, rhinorrhea, mild right-sided ear pain and of productive cough for 7 days.  Known sick contact.  Decreased appetite but tolerating fluids.  Has attempted use of Mucinex, TheraFlu and Tylenol with minimal relief.  History of asthma, seasonal allergies .  Denies of breath or wheezing.   Past Medical History:  Diagnosis Date   Abdominal pain affecting pregnancy, antepartum 09/26/2015   [redacted]w[redacted]d on 09/23/15    Allergic rhinitis 07/11/2018   Asthma    Bacterial vaginosis    Bronchitis    Choroid plexus cyst of fetus 01/07/2016   Mild intermittent asthma 07/11/2018   SVD (spontaneous vaginal delivery) 05/04/2016   UTI (lower urinary tract infection)     Patient Active Problem List   Diagnosis Date Noted   Miscarriage 09/27/2021   Dysuria 07/20/2021   Alpha thalassemia silent carrier 05/04/2021   Palpitations 07/17/2018   SOB (shortness of breath) 07/17/2018   Chest pain of uncertain etiology 07/17/2018   Mild intermittent asthma 07/11/2018   Allergic rhinitis 07/11/2018    Past Surgical History:  Procedure Laterality Date   NO PAST SURGERIES      OB History     Gravida  5   Para  1   Term  1   Preterm  0   AB  2   Living  1      SAB  0   IAB  1   Ectopic  0   Multiple  0   Live Births  1            Home Medications    Prior to Admission medications   Medication Sig Start Date End Date Taking? Authorizing Provider  cetirizine (ZYRTEC ALLERGY) 10 MG tablet Take 1 tablet (10 mg total) by mouth at bedtime. 06/16/21 09/14/21  Theadora Rama Scales, PA-C  fluconazole (DIFLUCAN) 150 MG tablet Take 1 tablet (150 mg total) by mouth once a  week. 01/31/22   Wallis Bamberg, PA-C  fluticasone (FLONASE) 50 MCG/ACT nasal spray Place 1 spray into both nostrils daily. 12/31/21   Theadora Rama Scales, PA-C  ipratropium (ATROVENT) 0.06 % nasal spray Place 2 sprays into both nostrils 3 (three) times daily. As needed for nasal congestion, runny nose 12/31/21   Theadora Rama Scales, PA-C  metroNIDAZOLE (FLAGYL) 500 MG tablet Take 1 tablet (500 mg total) by mouth 2 (two) times daily. 01/31/22   Wallis Bamberg, PA-C    Family History Family History  Problem Relation Age of Onset   Migraines Mother    Allergic rhinitis Father    Asthma Brother        as child   Cancer Maternal Aunt    Breast cancer Maternal Aunt    Cancer Maternal Grandmother    Breast cancer Maternal Grandmother    Allergic rhinitis Paternal Grandmother    Allergic rhinitis Paternal Grandfather     Social History Social History   Tobacco Use   Smoking status: Never   Smokeless tobacco: Never  Vaping Use   Vaping Use: Never used  Substance Use Topics   Alcohol use: Yes    Comment: social   Drug use: No  Allergies   Augmentin [amoxicillin-pot clavulanate]   Review of Systems Review of Systems  Constitutional:  Positive for chills and fever. Negative for activity change, appetite change, diaphoresis, fatigue and unexpected weight change.  HENT:  Positive for congestion, ear pain and rhinorrhea. Negative for dental problem, drooling, ear discharge, facial swelling, hearing loss, mouth sores, nosebleeds, postnasal drip, sinus pressure, sinus pain, sneezing, sore throat, tinnitus, trouble swallowing and voice change.   Respiratory:  Positive for cough. Negative for apnea, choking, chest tightness, shortness of breath, wheezing and stridor.   Cardiovascular: Negative.   Gastrointestinal: Negative.   Musculoskeletal:  Positive for myalgias. Negative for arthralgias, back pain, gait problem, joint swelling, neck pain and neck stiffness.     Physical  Exam Triage Vital Signs ED Triage Vitals  Enc Vitals Group     BP 03/09/22 1828 105/72     Pulse Rate 03/09/22 1828 (!) 107     Resp 03/09/22 1828 16     Temp 03/09/22 1828 (!) 102.1 F (38.9 C)     Temp Source 03/09/22 1828 Oral     SpO2 03/09/22 1828 97 %     Weight --      Height --      Head Circumference --      Peak Flow --      Pain Score 03/09/22 1825 10     Pain Loc --      Pain Edu? --      Excl. in GC? --    No data found.  Updated Vital Signs BP 105/72 (BP Location: Left Arm)   Pulse (!) 107   Temp (!) 102.1 F (38.9 C) (Oral)   Resp 16   LMP 02/17/2022 (Exact Date)   SpO2 97%   Visual Acuity Right Eye Distance:   Left Eye Distance:   Bilateral Distance:    Right Eye Near:   Left Eye Near:    Bilateral Near:     Physical Exam Constitutional:      Appearance: Normal appearance.  HENT:     Right Ear: Tympanic membrane, ear canal and external ear normal.     Left Ear: Tympanic membrane, ear canal and external ear normal.     Nose: Congestion and rhinorrhea present.     Mouth/Throat:     Mouth: Mucous membranes are moist.     Pharynx: Posterior oropharyngeal erythema present.  Cardiovascular:     Rate and Rhythm: Regular rhythm. Tachycardia present.     Pulses: Normal pulses.     Heart sounds: Normal heart sounds.  Pulmonary:     Effort: Pulmonary effort is normal.     Breath sounds: Normal breath sounds.  Skin:    General: Skin is warm and dry.  Neurological:     Mental Status: She is alert and oriented to person, place, and time. Mental status is at baseline.  Psychiatric:        Mood and Affect: Mood normal.        Behavior: Behavior normal.      UC Treatments / Results  Labs (all labs ordered are listed, but only abnormal results are displayed) Labs Reviewed - No data to display  EKG   Radiology No results found.  Procedures Procedures (including critical care time)  Medications Ordered in UC Medications  acetaminophen  (TYLENOL) tablet 650 mg (has no administration in time range)    Initial Impression / Assessment and Plan / UC Course  I have reviewed the triage vital signs  and the nursing notes.  Pertinent labs & imaging results that were available during my care of the patient were reviewed by me and considered in my medical decision making (see chart for details).  Acute upper respiratory infection  Fever of 102.1 with associated tachycardia noted in triage, Tylenol given in office on exam there is congestion to the nasal turbinates mild erythema to the oropharynx without tonsillar adenopathy or exudate, due to timeline of illness will defer viral testing, penicillin allergy confirmed and therefore cefdinir prescribed prophylactically prescribed Flexeril and Promethazine DM for additional support, may use over-the-counter medications as needed with follow-up with urgent care as needed, work note given Final Clinical Impressions(s) / UC Diagnoses   Final diagnoses:  None   Discharge Instructions   None    ED Prescriptions   None    PDMP not reviewed this encounter.   Valinda Hoar, NP 03/09/22 [phone]

## 2022-03-14 NOTE — L&D Delivery Note (Signed)
Delivery Note At 3:26 PM a viable female was delivered via Vaginal, Spontaneous (Presentation: Left Occiput Posterior).  APGAR: 8, 9; weight  pending.   Placenta status:  spontaneously,  intact with the appearance of calcifications, will send to pathology.  Cord:   3V with the following complications:  none.  Cord pH: n/a  Anesthesia: Epidural Episiotomy: None Lacerations:  None Suture Repair:  n/a Est. Blood Loss (mL):  163  Mom to postpartum.  Baby to Couplet care / Skin to Skin.  Purcell Nails 01/24/2023, 3:37 PM

## 2022-06-07 ENCOUNTER — Inpatient Hospital Stay (HOSPITAL_COMMUNITY)
Admission: AD | Admit: 2022-06-07 | Discharge: 2022-06-07 | Disposition: A | Discharge status: Home / Self Care | Payer: Medicaid Other | Attending: Obstetrics & Gynecology | Admitting: Obstetrics & Gynecology

## 2022-06-07 ENCOUNTER — Encounter (HOSPITAL_COMMUNITY): Payer: Self-pay

## 2022-06-07 DIAGNOSIS — O99511 Diseases of the respiratory system complicating pregnancy, first trimester: Secondary | ICD-10-CM | POA: Insufficient documentation

## 2022-06-07 DIAGNOSIS — J069 Acute upper respiratory infection, unspecified: Secondary | ICD-10-CM | POA: Insufficient documentation

## 2022-06-07 DIAGNOSIS — Z1152 Encounter for screening for COVID-19: Secondary | ICD-10-CM | POA: Diagnosis not present

## 2022-06-07 DIAGNOSIS — Z3A01 Less than 8 weeks gestation of pregnancy: Secondary | ICD-10-CM | POA: Diagnosis not present

## 2022-06-07 DIAGNOSIS — M549 Dorsalgia, unspecified: Secondary | ICD-10-CM | POA: Insufficient documentation

## 2022-06-07 DIAGNOSIS — O26891 Other specified pregnancy related conditions, first trimester: Secondary | ICD-10-CM | POA: Insufficient documentation

## 2022-06-07 DIAGNOSIS — R059 Cough, unspecified: Secondary | ICD-10-CM | POA: Insufficient documentation

## 2022-06-07 LAB — URINALYSIS, ROUTINE W REFLEX MICROSCOPIC
Bilirubin Urine: NEGATIVE
Glucose, UA: NEGATIVE mg/dL
Hgb urine dipstick: NEGATIVE
Ketones, ur: 20 mg/dL — AB
Leukocytes,Ua: NEGATIVE
Nitrite: NEGATIVE
Protein, ur: NEGATIVE mg/dL
Specific Gravity, Urine: 1.026 (ref 1.005–1.030)
pH: 5 (ref 5.0–8.0)

## 2022-06-07 LAB — SARS CORONAVIRUS 2 BY RT PCR: SARS Coronavirus 2 by RT PCR: NEGATIVE

## 2022-06-07 LAB — POCT PREGNANCY, URINE: Preg Test, Ur: POSITIVE — AB

## 2022-06-07 NOTE — MAU Provider Note (Signed)
History     CSN: [id number]  Arrival date and time: 06/07/22 1248   None     Chief Complaint  Patient presents with   Back Pain   Cough   Nasal Congestion   Sharon Waller is a 27 y.o. [id number] at [redacted]w[redacted]d who presents today with cough, runny nose, fever for the past few days. She has also had a child at home with similar symptoms.   Cough This is a new problem. The current episode started in the past 7 days. The problem has been unchanged. The cough is Non-productive. Associated symptoms include rhinorrhea. Nothing aggravates the symptoms.  URI  This is a new problem. The current episode started in the past 7 days. The problem has been unchanged. The maximum temperature recorded prior to her arrival was 100.4 - 100.9 F. The fever has been present for Less than 1 day. Associated symptoms include coughing and rhinorrhea. She has tried nothing for the symptoms.    OB History     Gravida  6   Para  1   Term  1   Preterm  0   AB  2   Living  1      SAB  0   IAB  1   Ectopic  0   Multiple  0   Live Births  1           Past Medical History:  Diagnosis Date   Abdominal pain affecting pregnancy, antepartum 09/26/2015   [redacted]w[redacted]d on 09/23/15    Allergic rhinitis 07/11/2018   Asthma    Bacterial vaginosis    Bronchitis    Choroid plexus cyst of fetus 01/07/2016   Mild intermittent asthma 07/11/2018   SVD (spontaneous vaginal delivery) 05/04/2016   UTI (lower urinary tract infection)     Past Surgical History:  Procedure Laterality Date   NO PAST SURGERIES      Family History  Problem Relation Age of Onset   Migraines Mother    Allergic rhinitis Father    Asthma Brother        as child   Cancer Maternal Aunt    Breast cancer Maternal Aunt    Cancer Maternal Grandmother    Breast cancer Maternal Grandmother    Allergic rhinitis Paternal Grandmother    Allergic rhinitis Paternal Grandfather     Social History   Tobacco Use   Smoking status: Never    Smokeless tobacco: Never  Vaping Use   Vaping Use: Never used  Substance Use Topics   Alcohol use: Yes    Comment: social   Drug use: No    Allergies:  Allergies  Allergen Reactions   Augmentin [Amoxicillin-Pot Clavulanate] Rash    Has patient had a PCN reaction causing immediate rash, facial/tongue/throat swelling, SOB or lightheadedness with hypotension: Yes Has patient had a PCN reaction causing severe rash involving mucus membranes or skin necrosis: No Has patient had a PCN reaction that required hospitalization Yes Has patient had a PCN reaction occurring within the last 10 years: No If all of the above answers are "NO", then may proceed with Cephalosporin use.     Medications Prior to Admission  Medication Sig Dispense Refill Last Dose   cetirizine (ZYRTEC ALLERGY) 10 MG tablet Take 1 tablet (10 mg total) by mouth at bedtime. 30 tablet 2    cyclobenzaprine (FLEXERIL) 5 MG tablet Take 1 tablet (5 mg total) by mouth 3 (three) times daily as needed for muscle spasms. 20 tablet  0    fluconazole (DIFLUCAN) 150 MG tablet Take 1 tablet (150 mg total) by mouth once a week. 2 tablet 0    fluticasone (FLONASE) 50 MCG/ACT nasal spray Place 1 spray into both nostrils daily. 47.4 mL 1    ipratropium (ATROVENT) 0.06 % nasal spray Place 2 sprays into both nostrils 3 (three) times daily. As needed for nasal congestion, runny nose 15 mL 1    metroNIDAZOLE (FLAGYL) 500 MG tablet Take 1 tablet (500 mg total) by mouth 2 (two) times daily. 14 tablet 0    promethazine-dextromethorphan (PROMETHAZINE-DM) 6.25-15 MG/5ML syrup Take 5 mLs by mouth at bedtime as needed for cough. 118 mL 0     Review of Systems  HENT:  Positive for rhinorrhea.   Respiratory:  Positive for cough.   All other systems reviewed and are negative.  Physical Exam   Blood pressure 111/68, pulse (!) 104, temperature 99.8 F (37.7 C), temperature source Oral, resp. rate 14, last menstrual period 04/18/2022, SpO2 100  %.  Physical Exam Vitals and nursing note reviewed.  HENT:     Head: Normocephalic.  Eyes:     Pupils: Pupils are equal, round, and reactive to light.  Cardiovascular:     Rate and Rhythm: Normal rate.  Pulmonary:     Effort: Pulmonary effort is normal.     Breath sounds: Normal breath sounds.  Musculoskeletal:        General: Normal range of motion.  Skin:    General: Skin is warm and dry.  Neurological:     Mental Status: She is alert and oriented to person, place, and time.  Psychiatric:        Mood and Affect: Mood normal.        Behavior: Behavior normal.      Results for orders placed or performed during the hospital encounter of 06/07/22 (from the past 24 hour(s))  SARS Coronavirus 2 by RT PCR (hospital order, performed in [facility] hospital lab) *cepheid single result test* Anterior Nasal Swab     Status: None   Collection Time: 06/07/22  1:10 PM   Specimen: Anterior Nasal Swab  Result Value Ref Range   SARS Coronavirus 2 by RT PCR NEGATIVE NEGATIVE  Urinalysis, Routine w reflex microscopic -Urine, Clean Catch     Status: Abnormal   Collection Time: 06/07/22  1:11 PM  Result Value Ref Range   Color, Urine YELLOW YELLOW   APPearance HAZY (A) CLEAR   Specific Gravity, Urine 1.026 1.005 - 1.030   pH 5.0 5.0 - 8.0   Glucose, UA NEGATIVE NEGATIVE mg/dL   Hgb urine dipstick NEGATIVE NEGATIVE   Bilirubin Urine NEGATIVE NEGATIVE   Ketones, ur 20 (A) NEGATIVE mg/dL   Protein, ur NEGATIVE NEGATIVE mg/dL   Nitrite NEGATIVE NEGATIVE   Leukocytes,Ua NEGATIVE NEGATIVE  Pregnancy, urine POC     Status: Abnormal   Collection Time: 06/07/22  1:14 PM  Result Value Ref Range   Preg Test, Ur POSITIVE (A) NEGATIVE     MAU Course  Procedures  MDM   Assessment and Plan   1. Viral URI with cough   2. [redacted] weeks gestation of pregnancy    DC home in stable condition  1st Trimester precautions  OTC med list given  RX: none  Return to MAU as needed FU with OB as  planned   Follow-up Information     Department, [facility] Follow up.   Contact information: [facility]  [place] [address] [place], CNM  06/07/22  3:08 PM

## 2022-06-07 NOTE — Discharge Instructions (Signed)

## 2022-06-07 NOTE — MAU Note (Addendum)
.  Sharon Waller is a 28 y.o. at [redacted]w[redacted]d here in MAU reporting: Has had a fever, cough, and runny nose for the last week.  States her daughter had been sick too. Also has been having chest and upper back pain when she coughs.   Pain score: 8 Vitals:   06/07/22 1300  BP: 111/68  Pulse: (!) 104  Resp: 14  Temp: 99.8 F (37.7 C)  SpO2: 100%     Lab orders placed from triage:  UPT, UA, Covid/flu

## 2022-07-14 LAB — OB RESULTS CONSOLE HEPATITIS B SURFACE ANTIGEN: Hepatitis B Surface Ag: NEGATIVE

## 2022-07-14 LAB — HEPATITIS C ANTIBODY: HCV Ab: NEGATIVE

## 2022-07-14 LAB — OB RESULTS CONSOLE RPR: RPR: NONREACTIVE

## 2022-07-14 LAB — OB RESULTS CONSOLE RUBELLA ANTIBODY, IGM: Rubella: IMMUNE

## 2022-07-14 LAB — OB RESULTS CONSOLE HIV ANTIBODY (ROUTINE TESTING): HIV: NONREACTIVE

## 2022-07-14 LAB — OB RESULTS CONSOLE ANTIBODY SCREEN: Antibody Screen: NEGATIVE

## 2022-07-18 LAB — OB RESULTS CONSOLE GC/CHLAMYDIA
Chlamydia: NEGATIVE
Neisseria Gonorrhea: NEGATIVE

## 2022-07-21 ENCOUNTER — Telehealth: Payer: Medicaid Other

## 2022-07-25 ENCOUNTER — Encounter: Payer: Medicaid Other | Admitting: Family Medicine

## 2022-08-12 ENCOUNTER — Other Ambulatory Visit: Payer: Self-pay | Admitting: Obstetrics and Gynecology

## 2022-08-12 DIAGNOSIS — Z363 Encounter for antenatal screening for malformations: Secondary | ICD-10-CM

## 2022-09-05 DIAGNOSIS — Z148 Genetic carrier of other disease: Secondary | ICD-10-CM | POA: Insufficient documentation

## 2022-09-09 ENCOUNTER — Other Ambulatory Visit: Payer: Self-pay | Admitting: Obstetrics and Gynecology

## 2022-09-09 ENCOUNTER — Encounter: Payer: Self-pay | Admitting: *Deleted

## 2022-09-09 ENCOUNTER — Ambulatory Visit: Payer: Medicaid Other | Attending: Obstetrics and Gynecology

## 2022-09-09 ENCOUNTER — Ambulatory Visit: Payer: Medicaid Other | Admitting: *Deleted

## 2022-09-09 VITALS — BP 107/61 | HR 75

## 2022-09-09 DIAGNOSIS — Z363 Encounter for antenatal screening for malformations: Secondary | ICD-10-CM

## 2022-09-09 DIAGNOSIS — D563 Thalassemia minor: Secondary | ICD-10-CM | POA: Insufficient documentation

## 2022-09-09 DIAGNOSIS — Z148 Genetic carrier of other disease: Secondary | ICD-10-CM | POA: Diagnosis present

## 2022-09-26 ENCOUNTER — Ambulatory Visit
Admission: EM | Admit: 2022-09-26 | Discharge: 2022-09-26 | Disposition: A | Discharge status: Home / Self Care | Payer: Medicaid Other | Attending: Urgent Care | Admitting: Urgent Care

## 2022-09-26 DIAGNOSIS — B3731 Acute candidiasis of vulva and vagina: Secondary | ICD-10-CM | POA: Insufficient documentation

## 2022-09-26 MED ORDER — CLOTRIMAZOLE 1 % VA CREA: 1.0000 | TOPICAL_CREAM | Freq: Every day | VAGINAL | 0 refills | Status: AC

## 2022-09-26 MED ORDER — MICONAZOLE NITRATE 2 % VA CREA: 1.0000 | TOPICAL_CREAM | Freq: Every day | VAGINAL | 0 refills | Status: DC

## 2022-09-26 NOTE — ED Triage Notes (Addendum)
Pt c/o vaginal irritation and itching x 3 days-sx started after taking abx for dental issue-denies d/c-pt states she is 5 mos pregnant-NAD-steady gait

## 2022-09-26 NOTE — Discharge Instructions (Addendum)
You were tested today for gonorrhea, chlamydia, trichomonas, BV, and yeast.  We will call you with the results of your test once received.  I suspect you have a yeast infection secondary to your recent antibiotics.  Please use the vaginal applicator once nightly x 7-10 days. If your symptoms persist, please call your OB to discuss additional treatment options.

## 2022-09-26 NOTE — ED Provider Notes (Signed)
[facility]    CSN: [id number] Arrival date & time: 09/26/22  1202      History   Chief Complaint Chief Complaint  Patient presents with   Vaginal Itching    HPI Sharon Waller is a 27 y.o. female.   27 year old female who is 5 months pregnant presents today due to concerns of vaginal itching.  This is been present for the past 3 days.  She denies any dysuria, urinary frequency, hematuria, vaginal discharge, pelvic pain.  She reports the symptoms started several days after starting amoxicillin for a dental infection. Does have hx of BV in the past.    Vaginal Itching    Past Medical History:  Diagnosis Date   Abdominal pain affecting pregnancy, antepartum 09/26/2015   [redacted]w[redacted]d on 09/23/15    Allergic rhinitis 07/11/2018   Asthma    Bacterial vaginosis    Bronchitis    Chest pain of uncertain etiology 07/17/2018   Choroid plexus cyst of fetus 01/07/2016   Dysuria 07/20/2021   Eczema    Mild intermittent asthma 07/11/2018   Miscarriage 09/27/2021   Pt initiated TAB with mifepristone 08/2021, but changed her mind and did not take misoprostol.  She presented to MAU with bleeding and was diagnosed with SAB, and took misoprostol after MAU visit.  [place] concernign for possible molar pregnancy. Per D.r Despina Hidden, D&C not indicated but need to follow quants down to       Palpitations 07/17/2018   SOB (shortness of breath) 07/17/2018   SVD (spontaneous vaginal delivery) 05/04/2016   UTI (lower urinary tract infection)     Patient Active Problem List   Diagnosis Date Noted   Carrier of spinal muscular atrophy 09/05/2022   Alpha thalassemia silent carrier 05/04/2021    Past Surgical History:  Procedure Laterality Date   NO PAST SURGERIES      OB History     Gravida  4   Para  1   Term  1   Preterm  0   AB  2   Living  1      SAB  0   IAB  2   Ectopic  0   Multiple  0   Live Births  1            Home Medications    Prior to Admission  medications   Medication Sig Start Date End Date Taking? Authorizing Provider  clotrimazole (GYNE-LOTRIMIN) 1 % vaginal cream Place 1 Applicatorful vaginally at bedtime for 7 days. 09/26/22 10/03/22 Yes Krizia Flight L, PA  cetirizine (ZYRTEC ALLERGY) 10 MG tablet Take 1 tablet (10 mg total) by mouth at bedtime. 06/16/21 09/14/21  Theadora Rama Scales, PA-C  Prenatal Vit-Fe Fumarate-FA (PRENATAL MULTIVITAMIN) TABS tablet Take 1 tablet by mouth daily at 12 noon.    [provider]    Family History Family History  Problem Relation Age of Onset   Migraines Mother    Allergic rhinitis Father    Asthma Brother        as child   Cancer Maternal Aunt    Breast cancer Maternal Aunt    Cancer Maternal Grandmother    Breast cancer Maternal Grandmother    Allergic rhinitis Paternal Grandmother    Allergic rhinitis Paternal Grandfather     Social History Social History   Tobacco Use   Smoking status: Former    Types: Cigarettes   Smokeless tobacco: Never  Vaping Use   Vaping status: Never Used  Substance  Use Topics   Alcohol use: Not Currently   Drug use: Not Currently     Allergies   Augmentin [amoxicillin-pot clavulanate]   Review of Systems Review of Systems As per HPI  Physical Exam Triage Vital Signs ED Triage Vitals  Encounter Vitals Group     BP 09/26/22 1239 106/73     Systolic BP Percentile --      Diastolic BP Percentile --      Pulse Rate 09/26/22 1239 81     Resp 09/26/22 1239 16     Temp 09/26/22 1239 98.3 F (36.8 C)     Temp Source 09/26/22 1239 Oral     SpO2 09/26/22 1239 98 %     Weight --      Height --      Head Circumference --      Peak Flow --      Pain Score 09/26/22 1236 5     Pain Loc --      Pain Education --      Exclude from Growth Chart --    No data found.  Updated Vital Signs BP 106/73 (BP Location: Right Arm)   Pulse 81   Temp 98.3 F (36.8 C) (Oral)   Resp 16   LMP 04/18/2022   SpO2 98%   Visual Acuity Right  Eye Distance:   Left Eye Distance:   Bilateral Distance:    Right Eye Near:   Left Eye Near:    Bilateral Near:     Physical Exam Vitals and nursing note reviewed.  Constitutional:      General: She is not in acute distress.    Appearance: Normal appearance. She is well-developed and normal weight. She is not ill-appearing, toxic-appearing or diaphoretic.  HENT:     Head: Normocephalic and atraumatic.  Eyes:     General: No scleral icterus.    Conjunctiva/sclera: Conjunctivae normal.  Cardiovascular:     Rate and Rhythm: Normal rate.  Pulmonary:     Effort: Pulmonary effort is normal. No respiratory distress.  Musculoskeletal:        General: No swelling.     Cervical back: Neck supple.  Skin:    General: Skin is warm and dry.     Capillary Refill: Capillary refill takes less than 2 seconds.     Coloration: Skin is not jaundiced.     Findings: No erythema or rash.  Neurological:     General: No focal deficit present.     Mental Status: She is alert and oriented to person, place, and time.  Psychiatric:        Mood and Affect: Mood normal.      UC Treatments / Results  Labs (all labs ordered are listed, but only abnormal results are displayed) Labs Reviewed  CERVICOVAGINAL ANCILLARY ONLY    EKG   Radiology No results found.  Procedures Procedures (including critical care time)  Medications Ordered in UC Medications - No data to display  Initial Impression / Assessment and Plan / UC Course  I have reviewed the triage vital signs and the nursing notes.  Pertinent labs & imaging results that were available during my care of the patient were reviewed by me and considered in my medical decision making (see chart for details).     Vaginal yeast infection - antibiotic induced. Aptima swab collected. Will do vaginal suppository as pt is pregnant. Discussed she would need to call OB for any additional Rx medications if refractory. No UTI  sx today.   Final  Clinical Impressions(s) / UC Diagnoses   Final diagnoses:  Vaginal yeast infection     Discharge Instructions      You were tested today for gonorrhea, chlamydia, trichomonas, BV, and yeast.  We will call you with the results of your test once received.  I suspect you have a yeast infection secondary to your recent antibiotics.  Please use the vaginal applicator once nightly x 7-10 days. If your symptoms persist, please call your OB to discuss additional treatment options.     ED Prescriptions     Medication Sig Dispense Auth. Provider   miconazole (MICONAZOLE 7) 2 % vaginal cream  (Status: Discontinued) Place 1 Applicatorful vaginally at bedtime for 7 days. 45 g Angellynn Kimberlin L, PA   clotrimazole (GYNE-LOTRIMIN) 1 % vaginal cream Place 1 Applicatorful vaginally at bedtime for 7 days. 45 g Marykathleen Russi L, [place]      PDMP not reviewed this encounter.   Maretta Bees, [place] 09/26/22 1327

## 2022-09-27 LAB — CERVICOVAGINAL ANCILLARY ONLY
Bacterial Vaginitis (gardnerella): NEGATIVE
Candida Glabrata: NEGATIVE
Candida Vaginitis: POSITIVE — AB
Chlamydia: NEGATIVE
Comment: NEGATIVE
Comment: NEGATIVE
Comment: NEGATIVE
Comment: NEGATIVE
Comment: NEGATIVE
Comment: NORMAL
Neisseria Gonorrhea: NEGATIVE
Trichomonas: NEGATIVE

## 2022-12-22 LAB — OB RESULTS CONSOLE GBS: GBS: NEGATIVE

## 2022-12-29 ENCOUNTER — Encounter: Payer: Self-pay | Admitting: Cardiology

## 2022-12-29 ENCOUNTER — Ambulatory Visit: Payer: Medicaid Other | Admitting: Dermatology

## 2022-12-29 ENCOUNTER — Encounter: Payer: Self-pay | Admitting: Dermatology

## 2022-12-29 ENCOUNTER — Ambulatory Visit: Payer: Medicaid Other | Attending: Cardiology | Admitting: Cardiology

## 2022-12-29 VITALS — BP 102/70 | HR 82 | Ht 66.0 in | Wt 165.8 lb

## 2022-12-29 VITALS — BP 103/66

## 2022-12-29 DIAGNOSIS — I471 Supraventricular tachycardia, unspecified: Secondary | ICD-10-CM

## 2022-12-29 DIAGNOSIS — Z3A36 36 weeks gestation of pregnancy: Secondary | ICD-10-CM

## 2022-12-29 DIAGNOSIS — B360 Pityriasis versicolor: Secondary | ICD-10-CM

## 2022-12-29 DIAGNOSIS — R002 Palpitations: Secondary | ICD-10-CM | POA: Diagnosis not present

## 2022-12-29 MED ORDER — KETOCONAZOLE 2 % EX SHAM: 1.0000 | MEDICATED_SHAMPOO | Freq: Every day | CUTANEOUS | 3 refills | Status: DC

## 2022-12-29 NOTE — Progress Notes (Signed)
   New Patient Visit   Subjective  Sharon Waller is a 27 y.o. female who presents for the following: Rash of upper back that she has had for years but it has gotten worse since she has been pregnant. She also gets bumps on her back. She was given TMC 0.1% cream but it did not help. She is 9 months pregnant and she is not planning to breastfeed. It gets more red after she gets out of the shower. It only itches sometimes.    The following portions of the chart were reviewed this encounter and updated as appropriate: medications, allergies, medical history  Review of Systems:  No other skin or systemic complaints except as noted in HPI or Assessment and Plan.  Objective  Well appearing patient in no apparent distress; mood and affect are within normal limits.   A focused examination was performed of the following areas:   Relevant exam findings are noted in the Assessment and Plan.    Assessment & Plan   Tinea Versicolor Exam: thin brown scaly plaques on back    Use ketoconazole shampoo as body wash once a day and let sit about 10 minutes before rinsing x 1 month then weekly for prevention.    Tinea versicolor is a chronic recurrent skin rash causing discolored scaly spots most commonly seen on back, chest, and/or shoulders.  It is generally asymptomatic. The rash is due to overgrowth of a common type of yeast present on everyone's skin and it is not contagious.  It tends to flare more in the summer due to increased sweating on trunk.  After rash is treated, the scaliness will resolve, but the discoloration will take longer to return to normal pigmentation. The periodic use of an OTC medicated soap/shampoo with zinc or selenium sulfide can be helpful to prevent yeast overgrowth and recurrence.      Return if symptoms worsen or fail to improve.  I, Joanie Coddington, CMA, am acting as scribe for [employer], DO .   Documentation: I have reviewed the above documentation for  accuracy and completeness, and I agree with the above.  Langston Reusing, DO

## 2022-12-29 NOTE — Patient Instructions (Signed)
Medication Instructions:  NO CHANGES  *If you need a refill on your cardiac medications before your next appointment, please call your pharmacy*   Follow-Up: At [facility], you and your health needs are our priority.  As part of our continuing mission to provide you with exceptional heart care, we have created designated Provider Care Teams.  These Care Teams include your primary Cardiologist (physician) and Advanced Practice Providers (APPs -  Physician Assistants and Nurse Practitioners) who all work together to provide you with the care you need, when you need it.  We recommend signing up for the patient portal called "MyChart".  Sign up information is provided on this After Visit Summary.  MyChart is used to connect with patients for Virtual Visits (Telemedicine).  Patients are able to view lab/test results, encounter notes, upcoming appointments, etc.  Non-urgent messages can be sent to your provider as well.   To learn more about what you can do with MyChart, go to [email or web address].    Your next appointment:    12 weeks with Dr. Servando Salina

## 2022-12-29 NOTE — Progress Notes (Signed)
Cardio-Obstetrics Clinic  New Evaluation  Date:  12/31/2022   ID:  Sharon Waller, DOB Jul 28, 1995, MRN [id number]  PCP:  Pcp, No   [facility] Providers Cardiologist:  Thomasene Ripple, DO  Electrophysiologist:  None       Referring MD: Mittie Bodo, NP   Chief Complaint: " I have been experiencing palpiations"  History of Present Illness:    Sharon Waller is a 27 y.o. female [G4P1021] who is being seen today for the evaluation of palpiation s at the request of Waller, Kylie, NP.    She is currently in her third trimester, presents with heart palpitations. She tells me that she had been experiencing these palpitations prior to pregnancy. She reports that these palpitations are not as severe as those she experienced a few years ago, which were diagnosed as supraventricular tachycardia (SVT). At that time, she  saw Dr. Johney Frame and -she was prescribed cardizem 120mg  daily and she it took for a few months until the symptoms subsided. She has not been on any heart medication for several years. The current palpitations are sporadic and improve when she lies down. She does not feel like she is going to pass out, as she did with the previous episode of SVT. She is due to give birth in one to two weeks.  Prior CV Studies Reviewed: The following studies were reviewed today: Reviewed echo an zio monitor  Past Medical History:  Diagnosis Date   Abdominal pain affecting pregnancy, antepartum 09/26/2015   [redacted]w[redacted]d on 09/23/15    Allergic rhinitis 07/11/2018   Asthma    Bacterial vaginosis    Bronchitis    Chest pain of uncertain etiology 07/17/2018   Choroid plexus cyst of fetus 01/07/2016   Dysuria 07/20/2021   Eczema    Mild intermittent asthma 07/11/2018   Miscarriage 09/27/2021   Pt initiated TAB with mifepristone 08/2021, but changed her mind and did not take misoprostol.  She presented to MAU with bleeding and was diagnosed with SAB, and took misoprostol after MAU visit.  [place]  concernign for possible molar pregnancy. Per D.r Despina Hidden, D&C not indicated but need to follow quants down to       Palpitations 07/17/2018   SOB (shortness of breath) 07/17/2018   SVD (spontaneous vaginal delivery) 05/04/2016   UTI (lower urinary tract infection)     Past Surgical History:  Procedure Laterality Date   NO PAST SURGERIES        OB History     Gravida  4   Para  1   Term  1   Preterm  0   AB  2   Living  1      SAB  0   IAB  2   Ectopic  0   Multiple  0   Live Births  1               Current Medications: Current Meds  Medication Sig   ketoconazole (NIZORAL) 2 % shampoo Apply 1 Application topically daily. Apply to back daily and let sit for about 10 minutes then rinse x 1 month. Then apply weekly for prevention.   Prenatal Vit-Fe Fumarate-FA (PRENATAL MULTIVITAMIN) TABS tablet Take 1 tablet by mouth daily at 12 noon.     Allergies:   Augmentin [amoxicillin-pot clavulanate]   Social History   Socioeconomic History   Marital status: Single    Spouse name: Not on file   Number of children: Not on file  Years of education: Not on file   Highest education level: Not on file  Occupational History   Not on file  Tobacco Use   Smoking status: Former    Types: Cigarettes   Smokeless tobacco: Never  Vaping Use   Vaping status: Never Used  Substance and Sexual Activity   Alcohol use: Not Currently   Drug use: Not Currently   Sexual activity: Yes    Birth control/protection: None  Other Topics Concern   Not on file  Social History Narrative   Right handed   Social Determinants of Health   Financial Resource Strain: Not on file  Food Insecurity: Not on file  Transportation Needs: Not on file  Physical Activity: Not on file  Stress: Not on file  Social Connections: Not on file      Family History  Problem Relation Age of Onset   Migraines Mother    Allergic rhinitis Father    Asthma Brother        as child   Cancer  Maternal Aunt    Breast cancer Maternal Aunt    Cancer Maternal Grandmother    Breast cancer Maternal Grandmother    Allergic rhinitis Paternal Grandmother    Allergic rhinitis Paternal Grandfather       ROS:   Please see the history of present illness.    Palpitation which also occurred prior to pregnancy All other systems reviewed and are negative.   Labs/EKG Reviewed:    EKG:   EKG was  ordered today.  The ekg ordered today demonstrates NSR, HR 82 bpm  Recent Labs: No results found for requested labs within last 365 days.   Recent Lipid Panel No results found for: "CHOL", "TRIG", "HDL", "CHOLHDL", "LDLCALC", "LDLDIRECT"  Physical Exam:    VS:  BP 102/70 (BP Location: Left Arm, Patient Position: Sitting, Cuff Size: Normal)   Pulse 82   Ht 5\' 6"  (1.676 m)   Wt 165 lb 12.8 oz (75.2 kg)   LMP 04/18/2022   BMI 26.76 kg/m     Wt Readings from Last 3 Encounters:  12/29/22 165 lb 12.8 oz (75.2 kg)  09/27/21 136 lb 3.2 oz (61.8 kg)  08/29/21 135 lb 12.8 oz (61.6 kg)     GEN:  Well nourished, well developed in no acute distress HEENT: Normal NECK: No JVD; No carotid bruits LYMPHATICS: No lymphadenopathy CARDIAC: RRR, no murmurs, rubs, gallops RESPIRATORY:  Clear to auscultation without rales, wheezing or rhonchi  ABDOMEN: Soft, non-tender, non-distended MUSCULOSKELETAL:  No edema; No deformity  SKIN: Warm and dry NEUROLOGIC:  Alert and oriented x 3 PSYCHIATRIC:  Normal affect    Risk Assessment/Risk Calculators:     CARPREG II Risk Prediction Index Score:  5.  The patient's risk for a primary cardiac event is 41%.   Modified World Health Organization [facility]) Classification of Maternal CV Risk   Class I         ASSESSMENT & PLAN:    Recurrent palpitations in the third trimester of pregnancy. No syncope or pre-syncope. History of SVT managed with medication, which was discontinued after symptoms resolved. -Plan to tolerate symptoms for the remaining 1-2 weeks  of pregnancy. -Postpartum, place a heart monitor to assess frequency and severity of SVT episodes. -Consider restarting medication based on monitor findings.  Pregnancy In third trimester 36 weeks with daily palpitations. No other complications reported. -Continue prenatal care. -Ensure EKG monitoring during labor. -Encourage hydration and reduction of caffeine and sweets to potentially reduce palpitations. -  Follow-up 12 weeks postpartum.   Patient Instructions  Medication Instructions:  NO CHANGES  *If you need a refill on your cardiac medications before your next appointment, please call your pharmacy*   Follow-Up: At [facility], you and your health needs are our priority.  As part of our continuing mission to provide you with exceptional heart care, we have created designated Provider Care Teams.  These Care Teams include your primary Cardiologist (physician) and Advanced Practice Providers (APPs -  Physician Assistants and Nurse Practitioners) who all work together to provide you with the care you need, when you need it.  We recommend signing up for the patient portal called "MyChart".  Sign up information is provided on this After Visit Summary.  MyChart is used to connect with patients for Virtual Visits (Telemedicine).  Patients are able to view lab/test results, encounter notes, upcoming appointments, etc.  Non-urgent messages can be sent to your provider as well.   To learn more about what you can do with MyChart, go to [email or web address].    Your next appointment:    12 weeks with Dr. Servando Salina   Dispo:  No follow-ups on file.   Medication Adjustments/Labs and Tests Ordered: Current medicines are reviewed at length with the patient today.  Concerns regarding medicines are outlined above.  Tests Ordered: Orders Placed This Encounter  Procedures   EKG 12-Lead   Medication Changes: No orders of the defined types were placed in this encounter.

## 2022-12-29 NOTE — Patient Instructions (Signed)
Dear Donnal Debar,  Thank you for visiting my office today. Your dedication to addressing your skin condition is commendable, and I am here to support you throughout this journey.  Here is a summary of the key instructions from today's consultation:  Diagnosis: Tinea Versicolor - Medication: Start using the prescribed ketoconazole shampoo.   - Application: Apply in the shower, allowing it to sit for a minute before rinsing off.   - Frequency: Use it twice a day, or at least once daily, for a month.  - Follow-Up: After your delivery, please return for a follow-up if the rash persists. At that time, we may consider a biopsy to ensure we are proceeding with the most accurate diagnosis and treatment plan.  - Monitoring: Keep an eye on how your skin responds to the ketoconazole shampoo. Should the rash improve but later return, you may use the shampoo once a week as a preventive measure.  I wish you a safe and healthy delivery of your baby boy. Should you have any questions or concerns before our next meeting, please feel free to reach out.  Warm regards,  Dr. Langston Reusing,  Dermatology    Important Information  Due to recent changes in healthcare laws, you may see results of your pathology and/or laboratory studies on MyChart before the doctors have had a chance to review them. We understand that in some cases there may be results that are confusing or concerning to you. Please understand that not all results are received at the same time and often the doctors may need to interpret multiple results in order to provide you with the best plan of care or course of treatment. Therefore, we ask that you please give [place] 2 business days to thoroughly review all your results before contacting the office for clarification. Should we see a critical lab result, you will be contacted sooner.   If You Need Anything After Your Visit  If you have any questions or concerns for your doctor, please call our main  line at [phone] If no one answers, please leave a voicemail as directed and we will return your call as soon as possible. Messages left after 4 pm will be answered the following business day.   You may also send [place] a message via MyChart. We typically respond to MyChart messages within 1-2 business days.  For prescription refills, please ask your pharmacy to contact our office. Our fax number is [phone].  If you have an urgent issue when the clinic is closed that cannot wait until the next business day, you can page your doctor at the number below.    Please note that while we do our best to be available for urgent issues outside of office hours, we are not available 24/7.   If you have an urgent issue and are unable to reach [place], you may choose to seek medical care at your doctor's office, retail clinic, urgent care center, or emergency room.  If you have a medical emergency, please immediately call 911 or go to the emergency department. In the event of inclement weather, please call our main line at [phone] for an update on the status of any delays or closures.  Dermatology Medication Tips: Please keep the boxes that topical medications come in in order to help keep track of the instructions about where and how to use these. Pharmacies typically print the medication instructions only on the boxes and not directly on the medication tubes.   If your medication is  too expensive, please contact our office at [phone] or send [place] a message through MyChart.   We are unable to tell what your co-pay for medications will be in advance as this is different depending on your insurance coverage. However, we may be able to find a substitute medication at lower cost or fill out paperwork to get insurance to cover a needed medication.   If a prior authorization is required to get your medication covered by your insurance company, please allow [place] 1-2 business days to complete this  process.  Drug prices often vary depending on where the prescription is filled and some pharmacies may offer cheaper prices.  The website www.goodrx.com contains coupons for medications through different pharmacies. The prices here do not account for what the cost may be with help from insurance (it may be cheaper with your insurance), but the website can give you the price if you did not use any insurance.  - You can print the associated coupon and take it with your prescription to the pharmacy.  - You may also stop by our office during regular business hours and pick up a GoodRx coupon card.  - If you need your prescription sent electronically to a different pharmacy, notify our office through [facility] or by phone at [phone]

## 2023-01-18 ENCOUNTER — Other Ambulatory Visit: Payer: Self-pay | Admitting: Obstetrics and Gynecology

## 2023-01-23 ENCOUNTER — Telehealth (HOSPITAL_COMMUNITY): Payer: Self-pay | Admitting: *Deleted

## 2023-01-23 NOTE — Telephone Encounter (Signed)
Preadmission screen  

## 2023-01-24 ENCOUNTER — Encounter (HOSPITAL_COMMUNITY): Payer: Self-pay | Admitting: Obstetrics and Gynecology

## 2023-01-24 ENCOUNTER — Inpatient Hospital Stay (HOSPITAL_COMMUNITY): Payer: Medicaid Other | Admitting: Anesthesiology

## 2023-01-24 ENCOUNTER — Inpatient Hospital Stay (HOSPITAL_COMMUNITY)
Admission: RE | Admit: 2023-01-24 | Discharge: 2023-01-25 | DRG: 807 | Disposition: A | Discharge status: Home / Self Care | Payer: Medicaid Other | Attending: Obstetrics and Gynecology | Admitting: Obstetrics and Gynecology

## 2023-01-24 ENCOUNTER — Other Ambulatory Visit: Payer: Self-pay

## 2023-01-24 DIAGNOSIS — Z825 Family history of asthma and other chronic lower respiratory diseases: Secondary | ICD-10-CM | POA: Diagnosis not present

## 2023-01-24 DIAGNOSIS — Z8679 Personal history of other diseases of the circulatory system: Secondary | ICD-10-CM

## 2023-01-24 DIAGNOSIS — Z148 Genetic carrier of other disease: Secondary | ICD-10-CM

## 2023-01-24 DIAGNOSIS — Z880 Allergy status to penicillin: Secondary | ICD-10-CM | POA: Diagnosis not present

## 2023-01-24 DIAGNOSIS — Z87891 Personal history of nicotine dependence: Secondary | ICD-10-CM | POA: Diagnosis not present

## 2023-01-24 DIAGNOSIS — Z803 Family history of malignant neoplasm of breast: Secondary | ICD-10-CM | POA: Diagnosis not present

## 2023-01-24 DIAGNOSIS — Z3490 Encounter for supervision of normal pregnancy, unspecified, unspecified trimester: Principal | ICD-10-CM | POA: Diagnosis present

## 2023-01-24 DIAGNOSIS — O480 Post-term pregnancy: Principal | ICD-10-CM | POA: Diagnosis present

## 2023-01-24 DIAGNOSIS — Z3A40 40 weeks gestation of pregnancy: Secondary | ICD-10-CM | POA: Diagnosis not present

## 2023-01-24 LAB — CBC
HCT: 34.3 % — ABNORMAL LOW (ref 36.0–46.0)
Hemoglobin: 11.1 g/dL — ABNORMAL LOW (ref 12.0–15.0)
MCH: 27.8 pg (ref 26.0–34.0)
MCHC: 32.4 g/dL (ref 30.0–36.0)
MCV: 85.8 fL (ref 80.0–100.0)
Platelets: 286 10*3/uL (ref 150–400)
RBC: 4 MIL/uL (ref 3.87–5.11)
RDW: 15.4 % (ref 11.5–15.5)
WBC: 7.1 10*3/uL (ref 4.0–10.5)
nRBC: 0 % (ref 0.0–0.2)

## 2023-01-24 LAB — TYPE AND SCREEN
ABO/RH(D): A POS
Antibody Screen: NEGATIVE

## 2023-01-24 MED ORDER — FENTANYL-BUPIVACAINE-NACL 0.5-0.125-0.9 MG/250ML-% EP SOLN
12.0000 mL/h | EPIDURAL | Status: DC | PRN
  Administered 2023-01-24: 12 mL/h via EPIDURAL
  Filled 2023-01-24: qty 250

## 2023-01-24 MED ORDER — ACETAMINOPHEN 325 MG PO TABS: 650.0000 mg | ORAL_TABLET | ORAL | Status: DC | PRN

## 2023-01-24 MED ORDER — DIBUCAINE (PERIANAL) 1 % EX OINT: 1.0000 | TOPICAL_OINTMENT | CUTANEOUS | Status: DC | PRN

## 2023-01-24 MED ORDER — ONDANSETRON HCL 4 MG/2ML IJ SOLN: 4.0000 mg | INTRAMUSCULAR | Status: DC | PRN

## 2023-01-24 MED ORDER — PRENATAL MULTIVITAMIN CH
1.0000 | ORAL_TABLET | Freq: Every day | ORAL | Status: DC
  Administered 2023-01-25: 1 via ORAL
  Filled 2023-01-24: qty 1

## 2023-01-24 MED ORDER — SIMETHICONE 80 MG PO CHEW: 80.0000 mg | CHEWABLE_TABLET | ORAL | Status: DC | PRN

## 2023-01-24 MED ORDER — LACTATED RINGERS IV SOLN: 500.0000 mL | Freq: Once | INTRAVENOUS | Status: DC

## 2023-01-24 MED ORDER — OXYCODONE HCL 5 MG PO TABS: 10.0000 mg | ORAL_TABLET | ORAL | Status: DC | PRN

## 2023-01-24 MED ORDER — EPHEDRINE 5 MG/ML INJ: 10.0000 mg | INTRAVENOUS | Status: DC | PRN

## 2023-01-24 MED ORDER — COCONUT OIL OIL: 1.0000 | TOPICAL_OIL | Status: DC | PRN

## 2023-01-24 MED ORDER — TERBUTALINE SULFATE 1 MG/ML IJ SOLN: 0.2500 mg | Freq: Once | INTRAMUSCULAR | Status: DC | PRN

## 2023-01-24 MED ORDER — SODIUM BICARBONATE 8.4 % IV SOLN
INTRAVENOUS | Status: DC | PRN
  Administered 2023-01-24: 5 mL via EPIDURAL

## 2023-01-24 MED ORDER — ONDANSETRON HCL 4 MG PO TABS: 4.0000 mg | ORAL_TABLET | ORAL | Status: DC | PRN

## 2023-01-24 MED ORDER — ONDANSETRON HCL 4 MG/2ML IJ SOLN
4.0000 mg | Freq: Four times a day (QID) | INTRAMUSCULAR | Status: DC | PRN
  Administered 2023-01-24: 4 mg via INTRAVENOUS
  Filled 2023-01-24: qty 2

## 2023-01-24 MED ORDER — LIDOCAINE HCL (PF) 1 % IJ SOLN
INTRAMUSCULAR | Status: DC | PRN
  Administered 2023-01-24: 2 mL via EPIDURAL
  Administered 2023-01-24: 3 mL via EPIDURAL
  Administered 2023-01-24: 5 mL via EPIDURAL

## 2023-01-24 MED ORDER — DIPHENHYDRAMINE HCL 50 MG/ML IJ SOLN: 12.5000 mg | INTRAMUSCULAR | Status: DC | PRN

## 2023-01-24 MED ORDER — BENZOCAINE-MENTHOL 20-0.5 % EX AERO: 1.0000 | INHALATION_SPRAY | CUTANEOUS | Status: DC | PRN

## 2023-01-24 MED ORDER — IBUPROFEN 600 MG PO TABS
600.0000 mg | ORAL_TABLET | Freq: Four times a day (QID) | ORAL | Status: DC
  Administered 2023-01-24 – 2023-01-25 (×4): 600 mg via ORAL
  Filled 2023-01-24 (×4): qty 1

## 2023-01-24 MED ORDER — TETANUS-DIPHTH-ACELL PERTUSSIS 5-2.5-18.5 LF-MCG/0.5 IM SUSY
0.5000 mL | PREFILLED_SYRINGE | Freq: Once | INTRAMUSCULAR | Status: DC
Start: 2023-01-25 — End: 2023-01-25

## 2023-01-24 MED ORDER — WITCH HAZEL-GLYCERIN EX PADS: 1.0000 | MEDICATED_PAD | CUTANEOUS | Status: DC | PRN

## 2023-01-24 MED ORDER — SOD CITRATE-CITRIC ACID 500-334 MG/5ML PO SOLN
30.0000 mL | ORAL | Status: DC | PRN
Start: 2023-01-24 — End: 2023-01-24

## 2023-01-24 MED ORDER — FENTANYL CITRATE (PF) 100 MCG/2ML IJ SOLN: 50.0000 ug | INTRAMUSCULAR | Status: DC | PRN

## 2023-01-24 MED ORDER — PHENYLEPHRINE 80 MCG/ML (10ML) SYRINGE FOR IV PUSH (FOR BLOOD PRESSURE SUPPORT): 80.0000 ug | PREFILLED_SYRINGE | INTRAVENOUS | Status: DC | PRN

## 2023-01-24 MED ORDER — DIPHENHYDRAMINE HCL 25 MG PO CAPS: 25.0000 mg | ORAL_CAPSULE | Freq: Four times a day (QID) | ORAL | Status: DC | PRN

## 2023-01-24 MED ORDER — OXYCODONE HCL 5 MG PO TABS
5.0000 mg | ORAL_TABLET | ORAL | Status: DC | PRN
Start: 2023-01-24 — End: 2023-01-25

## 2023-01-24 MED ORDER — LACTATED RINGERS IV SOLN: 500.0000 mL | INTRAVENOUS | Status: DC | PRN

## 2023-01-24 MED ORDER — OXYTOCIN-SODIUM CHLORIDE 30-0.9 UT/500ML-% IV SOLN
1.0000 m[IU]/min | INTRAVENOUS | Status: DC
  Administered 2023-01-24: 2 m[IU]/min via INTRAVENOUS

## 2023-01-24 MED ORDER — OXYCODONE-ACETAMINOPHEN 5-325 MG PO TABS: 2.0000 | ORAL_TABLET | ORAL | Status: DC | PRN

## 2023-01-24 MED ORDER — ACETAMINOPHEN 325 MG PO TABS
650.0000 mg | ORAL_TABLET | ORAL | Status: DC | PRN
Start: 2023-01-24 — End: 2023-01-24

## 2023-01-24 MED ORDER — LACTATED RINGERS IV SOLN: INTRAVENOUS | Status: DC

## 2023-01-24 MED ORDER — SENNOSIDES-DOCUSATE SODIUM 8.6-50 MG PO TABS
2.0000 | ORAL_TABLET | Freq: Every day | ORAL | Status: DC
  Administered 2023-01-25: 2 via ORAL
  Filled 2023-01-24: qty 2

## 2023-01-24 MED ORDER — ZOLPIDEM TARTRATE 5 MG PO TABS: 5.0000 mg | ORAL_TABLET | Freq: Every evening | ORAL | Status: DC | PRN

## 2023-01-24 MED ORDER — OXYTOCIN-SODIUM CHLORIDE 30-0.9 UT/500ML-% IV SOLN
2.5000 [IU]/h | INTRAVENOUS | Status: DC
  Filled 2023-01-24: qty 500

## 2023-01-24 MED ORDER — LIDOCAINE HCL (PF) 1 % IJ SOLN: 30.0000 mL | INTRAMUSCULAR | Status: DC | PRN

## 2023-01-24 MED ORDER — OXYCODONE-ACETAMINOPHEN 5-325 MG PO TABS: 1.0000 | ORAL_TABLET | ORAL | Status: DC | PRN

## 2023-01-24 MED ORDER — OXYTOCIN BOLUS FROM INFUSION
333.0000 mL | Freq: Once | INTRAVENOUS | Status: AC
  Administered 2023-01-24: 333 mL via INTRAVENOUS

## 2023-01-24 NOTE — H&P (Signed)
Sharon Waller is a 27 y.o. female presenting for IOL.  Denies any complaints and reports occasional ctxs, no LOF, no VB and good FM.  OB History     Gravida  4   Para  1   Term  1   Preterm  0   AB  2   Living  1      SAB  0   IAB  2   Ectopic  0   Multiple  0   Live Births  1          Past Medical History:  Diagnosis Date   Abdominal pain affecting pregnancy, antepartum 09/26/2015   [redacted]w[redacted]d on 09/23/15    Allergic rhinitis 07/11/2018   Asthma    Bacterial vaginosis    Bronchitis    Chest pain of uncertain etiology 07/17/2018   Choroid plexus cyst of fetus 01/07/2016   Dysuria 07/20/2021   Eczema    Mild intermittent asthma 07/11/2018   Miscarriage 09/27/2021   Pt initiated TAB with mifepristone 08/2021, but changed her mind and did not take misoprostol.  She presented to MAU with bleeding and was diagnosed with SAB, and took misoprostol after MAU visit.  [place] concernign for possible molar pregnancy. Per D.r Despina Hidden, D&C not indicated but need to follow quants down to       Palpitations 07/17/2018   SOB (shortness of breath) 07/17/2018   SVD (spontaneous vaginal delivery) 05/04/2016   UTI (lower urinary tract infection)    Past Surgical History:  Procedure Laterality Date   NO PAST SURGERIES     Family History: family history includes Allergic rhinitis in her father, paternal grandfather, and paternal grandmother; Asthma in her brother; Breast cancer in her maternal aunt and maternal grandmother; Cancer in her maternal aunt and maternal grandmother; Migraines in her mother. Social History:  reports that she has quit smoking. Her smoking use included cigarettes. She has never used smokeless tobacco. She reports that she does not currently use alcohol. She reports that she does not currently use drugs.     Maternal Diabetes: No Genetic Screening: Normal Maternal Ultrasounds/Referrals: Normal Fetal Ultrasounds or other Referrals:  None Maternal Substance Abuse:   No Significant Maternal Medications:  None Significant Maternal Lab Results:  Group B Strep negative Number of Prenatal Visits:greater than 3 verified prenatal visits Maternal Vaccinations: None Other Comments:   carrier of alpha thalassemia, FOB not tested.  Pt with h/o SVT no meds.  Review of Systems Denies F/C/N/V/D  History   Last menstrual period 04/18/2022. Exam Physical Exam  Lungs unlabored CV RRR Abdomen gravid, NT Extremities no calf tenderness  FHT 130, + accels, no decels, mod variability Toco q2-42min AROM clear fluid VE 4/80/-2, cephalic  Prenatal labs: ABO, Rh:  A Positive Antibody: Negative (05/02 0000) Rubella: Immune (05/02 0000) RPR: Nonreactive (05/02 0000)  HBsAg: Negative (05/02 0000)  HIV: Non-reactive (05/02 0000)  GBS: Negative/-- (10/10 0000)   Assessment/Plan: 16XW [id number] at 40 1/7wks admitted for IOL d/t pt desire and post due date.  Tracing overall reassuring but had episodes of decreased variability right at admission.  Now with cat 1 strip s/p AROM.  Pt may have pain medication upon request.  H/o SVT, plan telemetry while in labor.  Purcell Nails 01/24/2023, 11:26 AM

## 2023-01-24 NOTE — Anesthesia Procedure Notes (Signed)
Epidural Patient location during procedure: OB Start time: 01/24/2023 12:33 PM End time: 01/24/2023 12:40 PM  Staffing Anesthesiologist: Collene Schlichter, MD Performed: anesthesiologist   Preanesthetic Checklist Completed: patient identified, IV checked, risks and benefits discussed, monitors and equipment checked, pre-op evaluation and timeout performed  Epidural Patient position: sitting Prep: DuraPrep Patient monitoring: blood pressure and continuous pulse ox Approach: midline Location: L3-L4 Injection technique: LOR air  Needle:  Needle type: Tuohy  Needle gauge: 17 G Needle length: 9 cm Needle insertion depth: 4 cm Catheter size: 19 Gauge Catheter at skin depth: 9 cm Test dose: negative and Other (1% Lidocaine)  Additional Notes Patient identified.  Risk benefits discussed including failed block, incomplete pain control, headache, nerve damage, paralysis, blood pressure changes, nausea, vomiting, reactions to medication both toxic or allergic, and postpartum back pain.  Patient expressed understanding and wished to proceed.  All questions were answered.  Sterile technique used throughout procedure and epidural site dressed with sterile barrier dressing. No paresthesia or other complications noted. The patient did not experience any signs of intravascular injection such as tinnitus or metallic taste in mouth nor signs of intrathecal spread such as rapid motor block. Please see nursing notes for vital signs. Reason for block:procedure for pain

## 2023-01-24 NOTE — Anesthesia Preprocedure Evaluation (Addendum)
Anesthesia Evaluation  Patient identified by MRN, date of birth, ID band Patient awake    Reviewed: Allergy & Precautions, NPO status , Patient's Chart, lab work & pertinent test results  Airway Mallampati: II  TM Distance: >3 FB Neck ROM: Full    Dental  (+) Teeth Intact, Dental Advisory Given   Pulmonary asthma , former smoker   Pulmonary exam normal breath sounds clear to auscultation       Cardiovascular Normal cardiovascular exam+ dysrhythmias Supra Ventricular Tachycardia  Rhythm:Regular Rate:Normal     Neuro/Psych negative neurological ROS     GI/Hepatic negative GI ROS, Neg liver ROS,,,  Endo/Other  negative endocrine ROS    Renal/GU negative Renal ROS     Musculoskeletal negative musculoskeletal ROS (+)    Abdominal   Peds  Hematology negative hematology ROS (+) Plt 286k   Anesthesia Other Findings Day of surgery medications reviewed with the patient.  Reproductive/Obstetrics (+) Pregnancy                             Anesthesia Physical Anesthesia Plan  ASA: 3  Anesthesia Plan: Epidural   Post-op Pain Management:    Induction:   PONV Risk Score and Plan: 2 and Treatment may vary due to age or medical condition  Airway Management Planned: Natural Airway  Additional Equipment:   Intra-op Plan:   Post-operative Plan:   Informed Consent: I have reviewed the patients History and Physical, chart, labs and discussed the procedure including the risks, benefits and alternatives for the proposed anesthesia with the patient or authorized representative who has indicated his/her understanding and acceptance.     Dental advisory given  Plan Discussed with:   Anesthesia Plan Comments: (Patient identified. Risks/Benefits/Options discussed with patient including but not limited to bleeding, infection, nerve damage, paralysis, failed block, incomplete pain control, headache,  blood pressure changes, nausea, vomiting, reactions to medication both or allergic, itching and postpartum back pain. Confirmed with bedside nurse the patient's most recent platelet count. Confirmed with patient that they are not currently taking any anticoagulation, have any bleeding history or any family history of bleeding disorders. Patient expressed understanding and wished to proceed. All questions were answered. )       Anesthesia Quick Evaluation

## 2023-01-25 LAB — RPR: RPR Ser Ql: NONREACTIVE

## 2023-01-25 LAB — CBC
HCT: 30.4 % — ABNORMAL LOW (ref 36.0–46.0)
Hemoglobin: 9.9 g/dL — ABNORMAL LOW (ref 12.0–15.0)
MCH: 28.1 pg (ref 26.0–34.0)
MCHC: 32.6 g/dL (ref 30.0–36.0)
MCV: 86.4 fL (ref 80.0–100.0)
Platelets: 238 10*3/uL (ref 150–400)
RBC: 3.52 MIL/uL — ABNORMAL LOW (ref 3.87–5.11)
RDW: 15.3 % (ref 11.5–15.5)
WBC: 11.1 10*3/uL — ABNORMAL HIGH (ref 4.0–10.5)
nRBC: 0 % (ref 0.0–0.2)

## 2023-01-25 MED ORDER — POLYSACCHARIDE IRON COMPLEX 150 MG PO CAPS: 150.0000 mg | ORAL_CAPSULE | ORAL | 0 refills | Status: AC

## 2023-01-25 MED ORDER — IBUPROFEN 600 MG PO TABS: 600.0000 mg | ORAL_TABLET | Freq: Four times a day (QID) | ORAL | 0 refills | Status: DC

## 2023-01-25 MED ORDER — POLYSACCHARIDE IRON COMPLEX 150 MG PO CAPS: 150.0000 mg | ORAL_CAPSULE | ORAL | Status: DC

## 2023-01-25 NOTE — Discharge Instructions (Signed)
  Gerry, 1. Do not do any heavy lifting, i.e nothing heavier than 15 lbs for the next 6 weeks.  2.  Do not use tampons or douche or take baths, do not have any sexual intercourse or anything inside the vagina for the next 6 weeks.  3. Take your pain medication as needed for pain, let us know if the pain is not well controlled despite pain medication use.  4. Take your iron tablets for anemia as directed.  You may also take a stool softener e.g colace if you are constipated.    5.  If you get a fever while at home, do check your temperature and if it is equal to or greater than 100.4 please call the office.   6. Some vaginal bleeding is expected and normal after your delivery. Please let us know if if it excessive where you saturate 1 pad in less than 2 hours or so.  7. Please let us know if with depression or anxiety symptoms, or symptoms of uncontrolled blood pressure such as headache, vision changes, nausea, vomiting, chest pain, shortness of breath.   [facility] [phone].

## 2023-01-25 NOTE — Anesthesia Postprocedure Evaluation (Signed)
Anesthesia Post Note  Patient: Cylee I Gutzwiller  Procedure(s) Performed: AN AD HOC LABOR EPIDURAL     Patient location during evaluation: Mother Baby Anesthesia Type: Epidural Level of consciousness: awake and alert and oriented Pain management: satisfactory to patient Vital Signs Assessment: post-procedure vital signs reviewed and stable Respiratory status: respiratory function stable Cardiovascular status: stable Postop Assessment: no headache, no backache, epidural receding, patient able to bend at knees, no signs of nausea or vomiting, adequate PO intake and able to ambulate Anesthetic complications: no   No notable events documented.  Last Vitals:  Vitals:   01/25/23 0300 01/25/23 0500  BP: 104/75 104/60  Pulse: 60 62  Resp: 18 18  Temp: 36.7 C 36.8 C  SpO2: 100% 100%    Last Pain:  Vitals:   01/25/23 0836  TempSrc:   PainSc: 0-No pain   Pain Goal:                   Myleigh Amara

## 2023-01-25 NOTE — Discharge Summary (Signed)
Postpartum Discharge Summary     Patient Name: Sharon Waller DOB: 09-Dec-1995 MRN: [id number]  Date of admission: 01/24/2023 Delivery date:01/24/2023 Delivering provider: Osborn Coho Date of discharge: 01/25/2023  Admitting diagnosis: Encounter for induction of labor [Z34.90] Intrauterine pregnancy: [redacted]w[redacted]d     Secondary diagnosis:  Principal Problem:   Encounter for induction of labor Elective induction of labor.   Additional problems: None.     Discharge diagnosis: Term Pregnancy Delivered                                              Post partum procedures: None.  Augmentation: AROM and Pitocin Complications: None  Hospital course: Induction of Labor With Vaginal Delivery   27 y.o. yo [id number] at [redacted]w[redacted]d was admitted to the hospital 01/24/2023 for induction of labor.  Indication for induction: Elective.  Patient had a normal labor course.  Membrane Rupture Time/Date: 11:40 AM,01/24/2023  Delivery Method:Vaginal, Spontaneous Operative Delivery:N/A Episiotomy: None Lacerations:  None Details of delivery can be found in separate delivery note.  Patient had a normal postpartum course.  Patient is discharged home 01/25/23.  Newborn Data: Birth date:01/24/2023 Birth time:3:26 PM Gender:Female Living status:Living Apgars:8 ,9  Weight:3690 g  Immunizations received: Immunization History  Administered Date(s) Administered   PFIZER(Purple Top)SARS-COV-2 Vaccination 10/24/2019, 11/21/2019   PPD Test 01/25/2021   Tdap 03/21/2016    Physical exam  Vitals:   01/24/23 1900 01/24/23 2313 01/25/23 0300 01/25/23 0500  BP: 109/68 104/63 104/75 104/60  Pulse: 72 62 60 62  Resp: 20 18 18 18   Temp: 97.8 F (36.6 C) 97.9 F (36.6 C) 98 F (36.7 C) 98.2 F (36.8 C)  TempSrc:   Oral Oral  SpO2: 100% 100% 100% 100%  Weight:      Height:       General: alert, cooperative, and no distress Lochia: appropriate Uterine Fundus: firm Incision: N/A DVT Evaluation: No evidence  of DVT seen on physical exam. No significant calf/ankle edema. Labs: Lab Results  Component Value Date   WBC 11.1 (H) 01/25/2023   HGB 9.9 (L) 01/25/2023   HCT 30.4 (L) 01/25/2023   MCV 86.4 01/25/2023   PLT 238 01/25/2023      Latest Ref Rng & Units 08/29/2021    4:48 PM  CMP  Glucose 70 - 99 mg/dL 87   BUN 6 - 20 mg/dL <5   Creatinine 9.62 - 1.00 mg/dL 9.52   Sodium 841 - 324 mmol/L 135   Potassium 3.5 - 5.1 mmol/L 3.4   Chloride 98 - 111 mmol/L 102   CO2 22 - 32 mmol/L 25   Calcium 8.9 - 10.3 mg/dL 9.1   Total Protein 6.5 - 8.1 g/dL 7.1   Total Bilirubin 0.3 - 1.2 mg/dL 0.8   Alkaline Phos 38 - 126 U/L 41   AST 15 - 41 U/L 17   ALT 0 - 44 U/L 17    Edinburgh Score:     No data to display         No data recorded  After visit meds:  Allergies as of 01/25/2023       Reactions   Augmentin [amoxicillin-pot Clavulanate] Rash   Has patient had a PCN reaction causing immediate rash, facial/tongue/throat swelling, SOB or lightheadedness with hypotension: Yes Has patient had a PCN reaction causing severe rash involving mucus membranes or  skin necrosis: No Has patient had a PCN reaction that required hospitalization Yes Has patient had a PCN reaction occurring within the last 10 years: No If all of the above answers are "NO", then may proceed with Cephalosporin use.        Medication List     STOP taking these medications    cetirizine 10 MG tablet Commonly known as: ZyrTEC Allergy       TAKE these medications    ibuprofen 600 MG tablet Commonly known as: ADVIL Take 1 tablet (600 mg total) by mouth every 6 (six) hours.   iron polysaccharides 150 MG capsule Commonly known as: NIFEREX Take 1 capsule (150 mg total) by mouth every other day. Start taking on: January 26, 2023   ketoconazole 2 % shampoo Commonly known as: NIZORAL Apply 1 Application topically daily. Apply to back daily and let sit for about 10 minutes then rinse x 1 month. Then apply  weekly for prevention.   prenatal multivitamin Tabs tablet Take 1 tablet by mouth daily at 12 noon.        Discharge home in stable condition Infant Feeding: Breast Infant Disposition:home with mother Discharge instruction: per After Visit Summary and Postpartum booklet. Activity: Advance as tolerated. Pelvic rest for 6 weeks.  Diet: routine diet Future Appointments: Future Appointments  Date Time Provider Department Center  03/24/2023  9:40 AM Tobb, Lavona [place], DO [facility] None    Follow-up Information     Ob/[facility], [facility]. Schedule an appointment as soon as possible for a visit in 6 week(s).   Specialty: Obstetrics and Gynecology Why: Postpartum visit. Contact information: [address] [place] [address] [phone]                   Anticipated Birth Control:   Declined.   01/25/2023 Prescilla Sours, MD

## 2023-01-26 LAB — SURGICAL PATHOLOGY

## 2023-01-30 ENCOUNTER — Inpatient Hospital Stay (HOSPITAL_COMMUNITY): Payer: Medicaid Other

## 2023-02-06 ENCOUNTER — Telehealth (HOSPITAL_COMMUNITY): Payer: Self-pay

## 2023-02-06 NOTE — Telephone Encounter (Signed)
02/06/2023 1610  Name: Sharon Waller MRN: [id number] DOB: June 18, 1995  Reason for Call:  Transition of Care Hospital Discharge Call  Contact Status: Patient Contact Status: Complete  Language assistant needed:          Follow-Up Questions: Do You Have Any Concerns About Your Health As You Heal From Delivery?: No Do You Have Any Concerns About Your Infants Health?: No Patient states that she is getting to a pediatrician appointment currently.  Edinburgh Postnatal Depression Scale:  In the Past 7 Days:    PHQ2-9 Depression Scale:     Discharge Follow-up: Edinburgh score requires follow up?:  (RN explained EPDS to patient and patient declines EPDS today. Patient states "I'm not depressed or anything." RN explained to patient that is concerns arise to reach out to her OB-GYN. [facility] support group information provided.) Patient was advised of the following resources:: Support Group, Breastfeeding Support Group  Post-discharge interventions: Reviewed Newborn Safe Sleep Practices  Signature  Signe Colt

## 2023-02-13 IMAGING — US US OB < 14 WEEKS - US OB TV
1 series · 15 of 28 positions shown · non-contrast
Comparison: None.

CLINICAL DATA: Low back pain.

EXAM:
OBSTETRIC <14 WK US AND TRANSVAGINAL OB US
TECHNIQUE: Both transabdominal and transvaginal ultrasound examinations were
performed for complete evaluation of the gestation as well as the
maternal uterus, adnexal regions, and pelvic cul-de-sac.
Transvaginal technique was performed to assess early pregnancy.

[Series 1: us ob < 14 weeks - us ob tv · 15 of 43 slices shown]
[im 1/43]
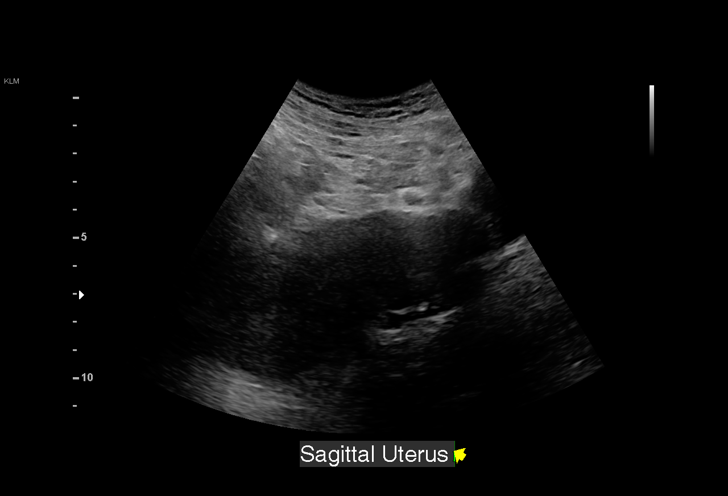
[im 4/43]
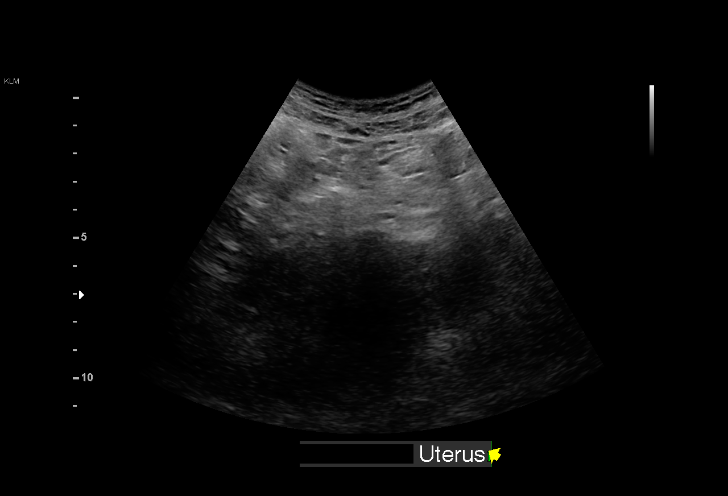
[im 7/43]
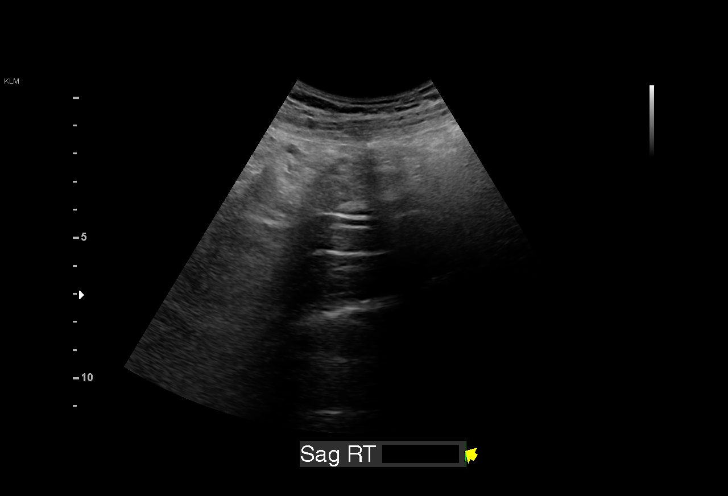
[im 10/43]
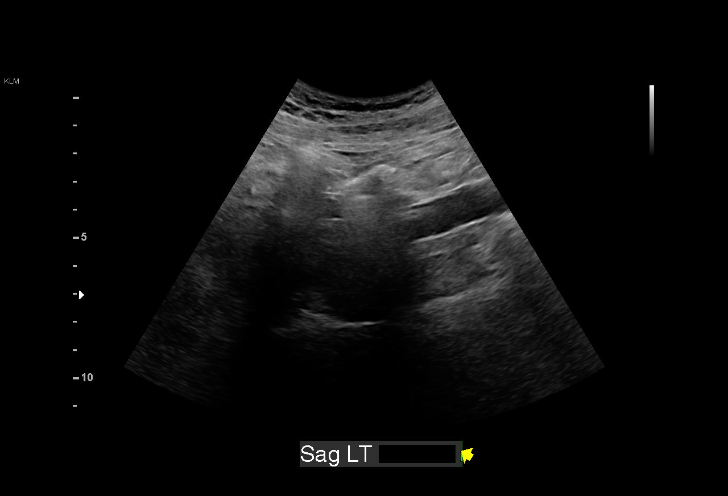
[im 13/43]
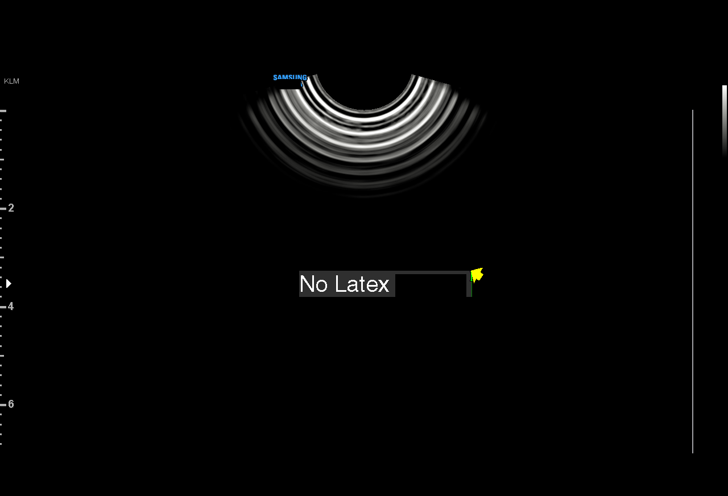
[im 16/43]
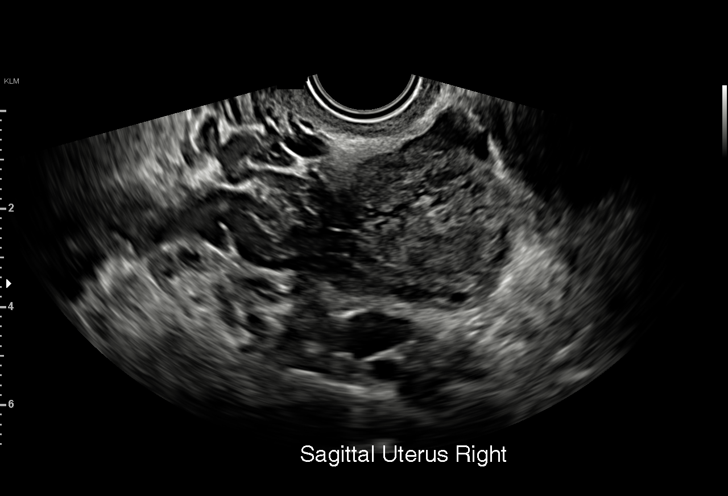
[im 19/43]
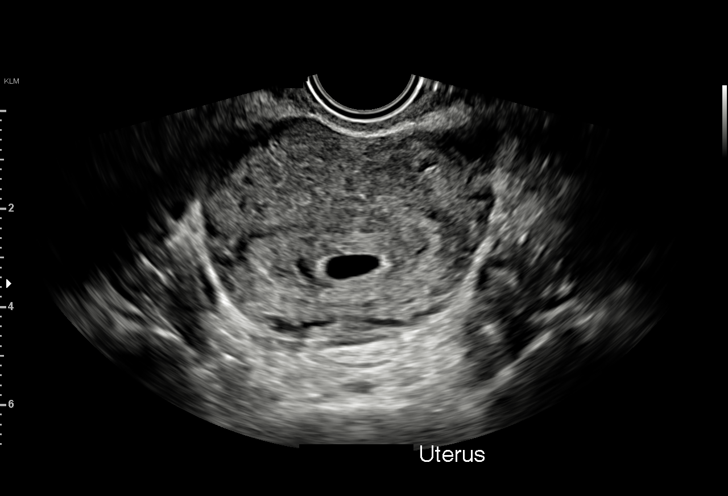
[im 22/43]
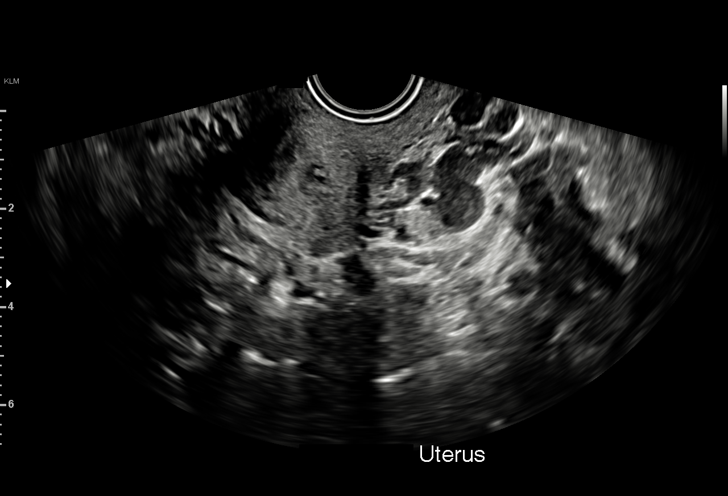
[im 24/43]
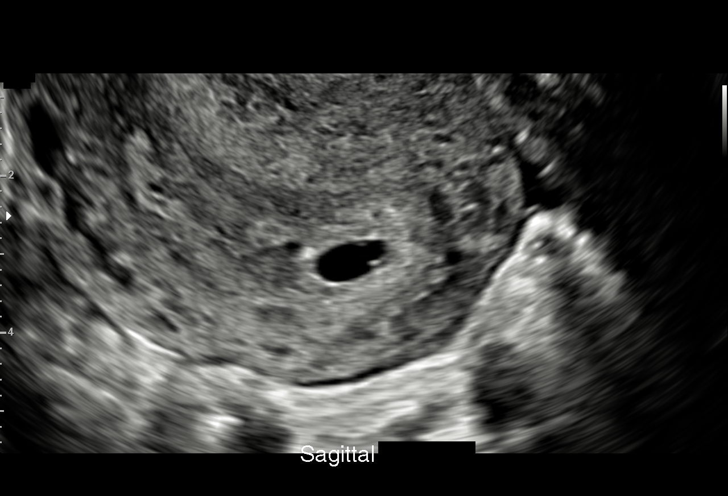
[im 27/43]
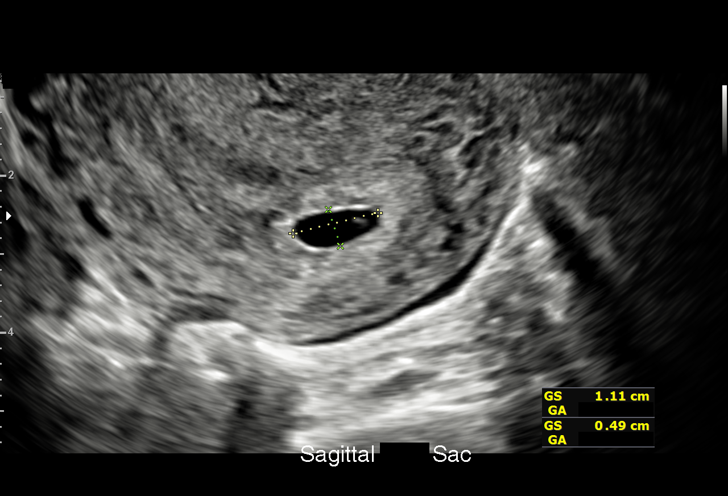
[im 30/43]
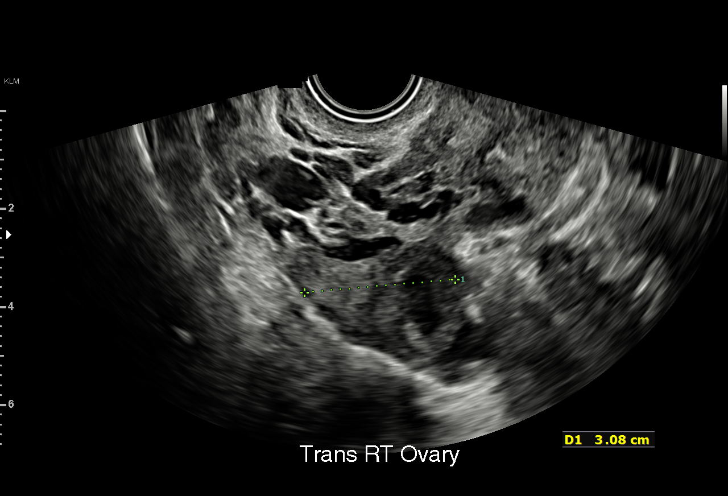
[im 33/43]
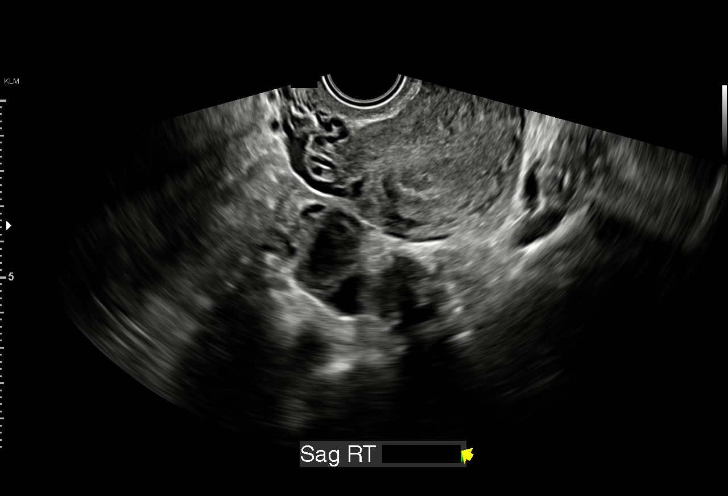
[im 36/43]
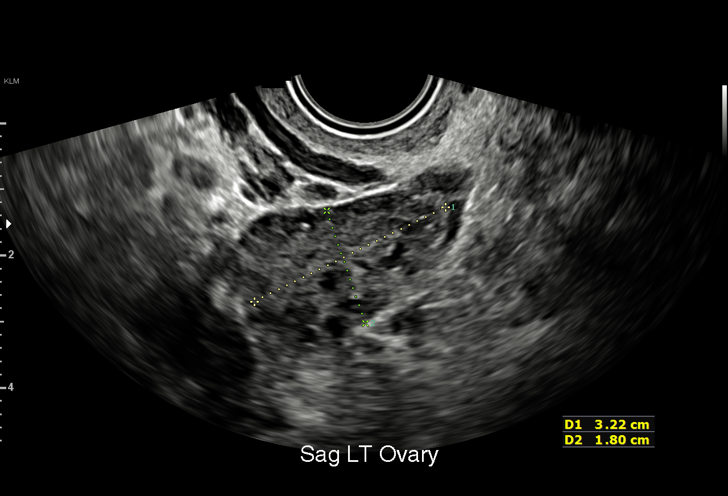
[im 39/43]
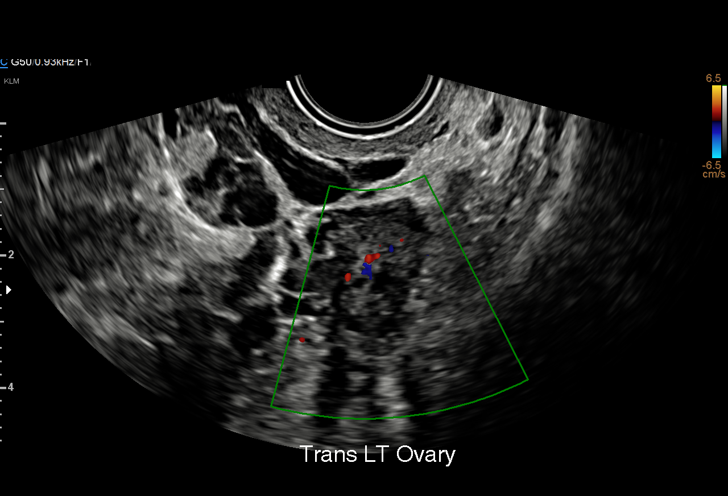
[im 43/43]
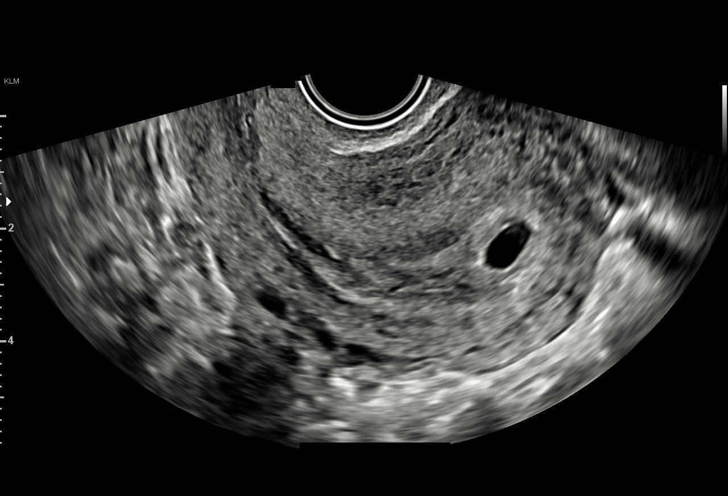

[15 of 28 positions shown; findings below may reference images not displayed]

FINDINGS: Intrauterine gestational sac: Single

Yolk sac:  Visualized.

Embryo:  Not Visualized.

MSD: 9.2 mm mm   5 w   5 d

Subchorionic hemorrhage:  None visualized.

Maternal uterus/adnexae: Bilateral maternal ovaries are visualized.
Corpus luteum is identified in the right ovary.
IMPRESSION: 1. Single intrauterine gestational sac measuring 5 weeks 5 days by
mean gestational sac diameter. Yolk sac is present, but no fetal
pole identified at this time which may be within normal limits for
sac the size. Recommend short-term interval follow-up ultrasound to
confirm viability.

## 2023-03-24 ENCOUNTER — Ambulatory Visit: Payer: Medicaid Other | Admitting: Cardiology

## 2023-04-10 DIAGNOSIS — N760 Acute vaginitis: Secondary | ICD-10-CM

## 2023-04-10 MED ORDER — METRONIDAZOLE 500 MG PO TABS: 500.0000 mg | ORAL_TABLET | Freq: Two times a day (BID) | ORAL | 0 refills | Status: DC

## 2023-08-27 ENCOUNTER — Ambulatory Visit (HOSPITAL_COMMUNITY)

## 2023-11-05 ENCOUNTER — Encounter (HOSPITAL_COMMUNITY): Payer: Self-pay | Admitting: Obstetrics and Gynecology

## 2023-11-05 ENCOUNTER — Inpatient Hospital Stay (HOSPITAL_COMMUNITY)
Admission: AD | Admit: 2023-11-05 | Discharge: 2023-11-06 | Disposition: A | Discharge status: Home / Self Care | Attending: Obstetrics and Gynecology | Admitting: Obstetrics and Gynecology

## 2023-11-05 DIAGNOSIS — Z3A09 9 weeks gestation of pregnancy: Secondary | ICD-10-CM | POA: Diagnosis not present

## 2023-11-05 DIAGNOSIS — O4691 Antepartum hemorrhage, unspecified, first trimester: Secondary | ICD-10-CM | POA: Diagnosis present

## 2023-11-05 DIAGNOSIS — O43191 Other malformation of placenta, first trimester: Secondary | ICD-10-CM

## 2023-11-05 DIAGNOSIS — O23591 Infection of other part of genital tract in pregnancy, first trimester: Secondary | ICD-10-CM | POA: Insufficient documentation

## 2023-11-05 DIAGNOSIS — R110 Nausea: Secondary | ICD-10-CM

## 2023-11-05 DIAGNOSIS — B9689 Other specified bacterial agents as the cause of diseases classified elsewhere: Secondary | ICD-10-CM | POA: Insufficient documentation

## 2023-11-05 LAB — POCT PREGNANCY, URINE: Preg Test, Ur: POSITIVE — AB

## 2023-11-05 NOTE — MAU Provider Note (Signed)
 Vaginal bleeding  S [name] is a 28 y.o. [phone] pregnant female at Unknown who presents to MAU today with complaint of vaginal bleeding. Pt states positive HPT 2 weeks ago, had coitus on 8/22 and followed by light bleeding.  She has continues to have light VB since then on and off.  Denies pain current but has had intermittent mild abdominal cramping.   Pertinent items noted in HPI and remainder of comprehensive ROS otherwise negative.   O BP 118/71 (BP Location: Right Arm)   Pulse 80   Temp 98.6 F (37 C) (Oral)   Resp 16   Ht 5' 6 (1.676 m)   Wt 72.2 kg   LMP 09/02/2023   SpO2 99%   BMI 25.68 kg/m  Physical Exam Vitals and nursing note reviewed.  Constitutional:      General: She is not in acute distress.    Appearance: Normal appearance. She is not ill-appearing.  HENT:     Head: Normocephalic and atraumatic.     Right Ear: External ear normal.     Left Ear: External ear normal.     Nose: Nose normal. No congestion.     Mouth/Throat:     Mouth: Mucous membranes are moist.     Pharynx: Oropharynx is clear.  Eyes:     Extraocular Movements: Extraocular movements intact.     Conjunctiva/sclera: Conjunctivae normal.  Cardiovascular:     Rate and Rhythm: Normal rate.  Pulmonary:     Effort: Pulmonary effort is normal. No respiratory distress.  Abdominal:     General: Abdomen is flat. There is no distension.     Palpations: Abdomen is soft.     Tenderness: There is no abdominal tenderness.  Musculoskeletal:        General: No swelling. Normal range of motion.     Cervical back: Normal range of motion.  Skin:    General: Skin is warm and dry.  Neurological:     Mental Status: She is alert and oriented to person, place, and time. Mental status is at baseline.     Motor: No weakness.     Gait: Gait normal.  Psychiatric:        Mood and Affect: Mood normal.        Behavior: Behavior normal.      MDM: MAU Course: Upreg positive here  CBC no  leukocytosis, Hgb 11.0 ABO/Rh A+ hCG collected  GC collected Wet Prep with clue cells *  TVUS showing SIUP 9 weeks 1 day, placental cyst   AP #9 weeks #Placental cyst  #BV - sending flagyl , diflucan  and zofran    Discharge from MAU in stable condition with strict/usual precautions Follow up at desired OBGYN as scheduled for ongoing prenatal care  Allergies as of 11/06/2023       Reactions   Augmentin [amoxicillin-pot Clavulanate] Rash   Has patient had a PCN reaction causing immediate rash, facial/tongue/throat swelling, SOB or lightheadedness with hypotension: Yes Has patient had a PCN reaction causing severe rash involving mucus membranes or skin necrosis: No Has patient had a PCN reaction that required hospitalization Yes Has patient had a PCN reaction occurring within the last 10 years: No If all of the above answers are NO, then may proceed with Cephalosporin use.        Medication List     STOP taking these medications    ibuprofen  600 MG tablet Commonly known as: ADVIL    ketoconazole  2 % shampoo Commonly known as:  NIZORAL        TAKE these medications    fluconazole  150 MG tablet Commonly known as: Diflucan  Take 1 tablet (150 mg total) by mouth daily.   iron  polysaccharides 150 MG capsule Commonly known as: NIFEREX Take 1 capsule (150 mg total) by mouth every other day.   metroNIDAZOLE  500 MG tablet Commonly known as: FLAGYL  Take 1 tablet (500 mg total) by mouth 2 (two) times daily.   ondansetron  4 MG disintegrating tablet Commonly known as: ZOFRAN -ODT Take 1 tablet (4 mg total) by mouth every 8 (eight) hours as needed for nausea or vomiting.   prenatal multivitamin Tabs tablet Take 1 tablet by mouth daily at 12 noon.        Jhonny Augustin BROCKS, MD 11/06/2023 1:19 AM

## 2023-11-05 NOTE — MAU Note (Signed)
 MAU Triage Note:  .Sharon Waller is a 28 y.o. at Unknown here in MAU reporting: Positive HPT 2 weeks ago. Had IC on Friday that was followed by light bleeding. Has continued to have light bleeding on and off since then. Denies pain at this time, but does report intermittent mild abdominal cramping.   Patient complaint: bleeding   Denies pain     Onset of complaint: Friday LMP: Patient's last menstrual period was 09/02/2023.  Vitals:   11/05/23 2327  BP: 118/71  Pulse: 80  Resp: 16  Temp: 98.6 F (37 C)  SpO2: 99%     Lab orders placed from triage: UPT

## 2023-11-06 ENCOUNTER — Inpatient Hospital Stay (HOSPITAL_COMMUNITY)

## 2023-11-06 DIAGNOSIS — Z3A09 9 weeks gestation of pregnancy: Secondary | ICD-10-CM

## 2023-11-06 DIAGNOSIS — O23591 Infection of other part of genital tract in pregnancy, first trimester: Secondary | ICD-10-CM

## 2023-11-06 LAB — URINALYSIS, ROUTINE W REFLEX MICROSCOPIC
Bilirubin Urine: NEGATIVE
Glucose, UA: NEGATIVE mg/dL
Ketones, ur: NEGATIVE mg/dL
Nitrite: NEGATIVE
Protein, ur: 100 mg/dL — AB
RBC / HPF: >50 RBC/hpf (ref 0–5)
Specific Gravity, Urine: 1.028 (ref 1.005–1.030)
pH: 5 (ref 5.0–8.0)

## 2023-11-06 LAB — GC/CHLAMYDIA PROBE AMP (~~LOC~~) NOT AT ARMC
Chlamydia: NEGATIVE
Comment: NEGATIVE
Comment: NORMAL
Neisseria Gonorrhea: NEGATIVE

## 2023-11-06 LAB — WET PREP, GENITAL
Sperm: NONE SEEN
Trich, Wet Prep: NONE SEEN
WBC, Wet Prep HPF POC: <10 (ref <10)
Yeast Wet Prep HPF POC: NONE SEEN

## 2023-11-06 LAB — HCG, QUANTITATIVE, PREGNANCY: hCG, Beta Chain, Quant, S: >250000 m[IU]/mL — ABNORMAL HIGH (ref <5)

## 2023-11-06 LAB — CBC
HCT: 33.3 % — ABNORMAL LOW (ref 36.0–46.0)
Hemoglobin: 11 g/dL — ABNORMAL LOW (ref 12.0–15.0)
MCH: 29.9 pg (ref 26.0–34.0)
MCHC: 33 g/dL (ref 30.0–36.0)
MCV: 90.5 fL (ref 80.0–100.0)
Platelets: 279 K/uL (ref 150–400)
RBC: 3.68 MIL/uL — ABNORMAL LOW (ref 3.87–5.11)
RDW: 14.7 % (ref 11.5–15.5)
WBC: 7.1 K/uL (ref 4.0–10.5)
nRBC: 0 % (ref 0.0–0.2)

## 2023-11-06 LAB — ABO/RH: ABO/RH(D): A POS

## 2023-11-06 MED ORDER — METRONIDAZOLE 500 MG PO TABS: 500.0000 mg | ORAL_TABLET | Freq: Two times a day (BID) | ORAL | 0 refills | Status: AC

## 2023-11-06 MED ORDER — ONDANSETRON 4 MG PO TBDP: 4.0000 mg | ORAL_TABLET | Freq: Three times a day (TID) | ORAL | 0 refills | Status: AC | PRN

## 2023-11-06 MED ORDER — FLUCONAZOLE 150 MG PO TABS: 150.0000 mg | ORAL_TABLET | Freq: Every day | ORAL | 1 refills | Status: AC

## 2023-11-06 NOTE — Discharge Instructions (Signed)
